# Patient Record
Sex: Female | Born: 1940 | Race: White | Hispanic: No | Marital: Married | Smoking: Former smoker
Health system: Southern US, Community
[De-identification: ages and names within clinical notes are randomized; demographics above are authoritative.]

## PROBLEM LIST (undated history)

## (undated) DIAGNOSIS — I10 Essential (primary) hypertension: Secondary | ICD-10-CM

## (undated) DIAGNOSIS — R51 Headache: Secondary | ICD-10-CM

## (undated) DIAGNOSIS — M1990 Unspecified osteoarthritis, unspecified site: Secondary | ICD-10-CM

## (undated) DIAGNOSIS — D649 Anemia, unspecified: Secondary | ICD-10-CM

## (undated) DIAGNOSIS — R519 Headache, unspecified: Secondary | ICD-10-CM

## (undated) HISTORY — PX: TUBAL LIGATION: SHX77

## (undated) HISTORY — PX: GALLBLADDER SURGERY: SHX652

---

## 2011-09-27 DIAGNOSIS — W19XXXA Unspecified fall, initial encounter: Secondary | ICD-10-CM | POA: Diagnosis not present

## 2011-09-27 DIAGNOSIS — E559 Vitamin D deficiency, unspecified: Secondary | ICD-10-CM | POA: Diagnosis not present

## 2011-09-27 DIAGNOSIS — I10 Essential (primary) hypertension: Secondary | ICD-10-CM | POA: Diagnosis not present

## 2011-09-27 DIAGNOSIS — M25519 Pain in unspecified shoulder: Secondary | ICD-10-CM | POA: Diagnosis not present

## 2012-04-21 DIAGNOSIS — N3000 Acute cystitis without hematuria: Secondary | ICD-10-CM | POA: Diagnosis not present

## 2012-06-07 ENCOUNTER — Emergency Department (HOSPITAL_COMMUNITY): Payer: Medicare Other

## 2012-06-07 ENCOUNTER — Encounter (HOSPITAL_COMMUNITY): Payer: Self-pay | Admitting: Emergency Medicine

## 2012-06-07 ENCOUNTER — Other Ambulatory Visit: Payer: Self-pay

## 2012-06-07 ENCOUNTER — Emergency Department (HOSPITAL_COMMUNITY)
Admission: EM | Admit: 2012-06-07 | Discharge: 2012-06-07 | Disposition: A | Discharge status: Home / Self Care | Payer: Medicare Other | Attending: Emergency Medicine | Admitting: Emergency Medicine

## 2012-06-07 DIAGNOSIS — I10 Essential (primary) hypertension: Secondary | ICD-10-CM | POA: Diagnosis not present

## 2012-06-07 DIAGNOSIS — R42 Dizziness and giddiness: Secondary | ICD-10-CM | POA: Diagnosis not present

## 2012-06-07 DIAGNOSIS — Z8739 Personal history of other diseases of the musculoskeletal system and connective tissue: Secondary | ICD-10-CM | POA: Insufficient documentation

## 2012-06-07 DIAGNOSIS — W19XXXA Unspecified fall, initial encounter: Secondary | ICD-10-CM | POA: Insufficient documentation

## 2012-06-07 DIAGNOSIS — Z9119 Patient's noncompliance with other medical treatment and regimen: Secondary | ICD-10-CM | POA: Diagnosis not present

## 2012-06-07 DIAGNOSIS — S43006A Unspecified dislocation of unspecified shoulder joint, initial encounter: Secondary | ICD-10-CM | POA: Diagnosis not present

## 2012-06-07 DIAGNOSIS — Y929 Unspecified place or not applicable: Secondary | ICD-10-CM | POA: Insufficient documentation

## 2012-06-07 DIAGNOSIS — M24419 Recurrent dislocation, unspecified shoulder: Secondary | ICD-10-CM | POA: Diagnosis not present

## 2012-06-07 DIAGNOSIS — Y939 Activity, unspecified: Secondary | ICD-10-CM | POA: Insufficient documentation

## 2012-06-07 DIAGNOSIS — Z91199 Patient's noncompliance with other medical treatment and regimen due to unspecified reason: Secondary | ICD-10-CM | POA: Insufficient documentation

## 2012-06-07 DIAGNOSIS — S43016A Anterior dislocation of unspecified humerus, initial encounter: Secondary | ICD-10-CM | POA: Diagnosis not present

## 2012-06-07 HISTORY — DX: Essential (primary) hypertension: I10

## 2012-06-07 HISTORY — DX: Unspecified osteoarthritis, unspecified site: M19.90

## 2012-06-07 LAB — CBC WITH DIFFERENTIAL/PLATELET
Eosinophils Relative: 0 % (ref 0–5)
Lymphocytes Relative: 17 % (ref 12–46)
Lymphs Abs: 1.3 10*3/uL (ref 0.7–4.0)
MCV: 88 fL (ref 78.0–100.0)
Neutro Abs: 5.8 10*3/uL (ref 1.7–7.7)
Neutrophils Relative %: 76 % (ref 43–77)
Platelets: 192 10*3/uL (ref 150–400)
RBC: 4.18 MIL/uL (ref 3.87–5.11)
WBC: 7.7 10*3/uL (ref 4.0–10.5)

## 2012-06-07 LAB — BASIC METABOLIC PANEL
CO2: 22 mEq/L (ref 19–32)
Chloride: 103 mEq/L (ref 96–112)
Glucose, Bld: 132 mg/dL — ABNORMAL HIGH (ref 70–99)
Potassium: 3.7 mEq/L (ref 3.5–5.1)
Sodium: 135 mEq/L (ref 135–145)

## 2012-06-07 MED ORDER — HYDROMORPHONE HCL PF 1 MG/ML IJ SOLN
1.0000 mg | Freq: Once | INTRAMUSCULAR | Status: AC
  Administered 2012-06-07: 1 mg via INTRAVENOUS
  Filled 2012-06-07: qty 1

## 2012-06-07 MED ORDER — ONDANSETRON HCL 4 MG/2ML IJ SOLN
4.0000 mg | Freq: Once | INTRAMUSCULAR | Status: AC
  Administered 2012-06-07: 4 mg via INTRAVENOUS
  Filled 2012-06-07: qty 2

## 2012-06-07 MED ORDER — OXYCODONE-ACETAMINOPHEN 5-325 MG PO TABS: 2.0000 | ORAL_TABLET | ORAL | Status: DC | PRN

## 2012-06-07 MED ORDER — SODIUM CHLORIDE 0.9 % IV SOLN: Freq: Once | INTRAVENOUS | Status: DC

## 2012-06-07 NOTE — ED Notes (Signed)
Left shoulder sling placed and second xray completed,

## 2012-06-07 NOTE — ED Provider Notes (Signed)
Medical screening examination/treatment/procedure(s) were conducted as a shared visit with non-physician practitioner(s) and myself.  I personally evaluated the patient during the encounter.    L shoulder deformity and TTP with distal N/V intact. Mild abrasion L hand no deformity. A/O x 4. Lungs CTA. Heart RRR.   See attached note with shoulder reduction  Results for orders placed during the hospital encounter of 06/07/12  CBC WITH DIFFERENTIAL      Component Value Range   WBC 7.7  4.0 - 10.5 K/uL   RBC 4.18  3.87 - 5.11 MIL/uL   Hemoglobin 12.3  12.0 - 15.0 g/dL   HCT 16.1  09.6 - 04.5 %   MCV 88.0  78.0 - 100.0 fL   MCH 29.4  26.0 - 34.0 pg   MCHC 33.4  30.0 - 36.0 g/dL   RDW 40.9  81.1 - 91.4 %   Platelets 192  150 - 400 K/uL   Neutrophils Relative 76  43 - 77 %   Neutro Abs 5.8  1.7 - 7.7 K/uL   Lymphocytes Relative 17  12 - 46 %   Lymphs Abs 1.3  0.7 - 4.0 K/uL   Monocytes Relative 8  3 - 12 %   Monocytes Absolute 0.6  0.1 - 1.0 K/uL   Eosinophils Relative 0  0 - 5 %   Eosinophils Absolute 0.0  0.0 - 0.7 K/uL   Basophils Relative 0  0 - 1 %   Basophils Absolute 0.0  0.0 - 0.1 K/uL  BASIC METABOLIC PANEL      Component Value Range   Sodium 135  135 - 145 mEq/L   Potassium 3.7  3.5 - 5.1 mEq/L   Chloride 103  96 - 112 mEq/L   CO2 22  19 - 32 mEq/L   Glucose, Bld 132 (*) 70 - 99 mg/dL   BUN 13  6 - 23 mg/dL   Creatinine, Ser 7.82  0.50 - 1.10 mg/dL   Calcium 8.7  8.4 - 95.6 mg/dL   GFR calc non Af Amer 86 (*) >90 mL/min   GFR calc Af Amer >90  >90 mL/min   Dg Shoulder Left  06/07/2012  *RADIOLOGY REPORT*  Clinical Data: Postreduction.  LEFT SHOULDER - 2+ VIEW  Comparison: 06/07/2012 0616 hours  Findings: Interval reduction of previous left anterior shoulder dislocation.  Inferior glenoid is not well demonstrated on this view.  No discrete fractures otherwise identified.  IMPRESSION: Interval reduction of previous anterior dislocation of the left shoulder.   Original  Report Authenticated By: Marlon Pel, M.D.    Dg Shoulder Left  06/07/2012  *RADIOLOGY REPORT*  Clinical Data: Anterior shoulder pain after fall.  LEFT SHOULDER - 2+ VIEW  Comparison: None.  Findings: There is anterior dislocation of the humeral head with respect to the glenoid.  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  No focal bone lesion. Acromioclavicular and coracoclavicular spaces are intact. Prominent transverse processes of C7.  IMPRESSION: Anterior dislocation of the left shoulder with suspicion of Bankart lesion on the inferior glenoid.   Original Report Authenticated By: Marlon Pel, M.D.       Sunnie Nielsen, MD 06/07/12 [phone]

## 2012-06-07 NOTE — ED Provider Notes (Signed)
History     CSN: [id number]  Arrival date & time 06/07/12  4098   First MD Initiated Contact with Patient 06/07/12 [phone]      Chief Complaint  Patient presents with  . Shoulder Injury  . Dizziness  . Fall    (Consider location/radiation/quality/duration/timing/severity/associated sxs/prior treatment) HPI  Past Medical History  Diagnosis Date  . Arthritis   . Hypertension     Past Surgical History  Procedure Date  . Tubal ligation   . Gallbladder surgery     No family history on file.  History  Substance Use Topics  . Smoking status: Former Smoker    Quit date: 06/06/2009  . Smokeless tobacco: Not on file  . Alcohol Use: No    OB History    Grav Para Term Preterm Abortions TAB SAB Ect Mult Living                  Review of Systems  Allergies  Review of patient's allergies indicates not on file.  Home Medications  No current outpatient prescriptions on file.  BP 166/90  Pulse 73  Temp 97.5 F (36.4 C) (Oral)  Resp 20  Ht 5\' 2"  (1.575 m)  Wt 185 lb (83.915 kg)  BMI 33.84 kg/m2  SpO2 100%  Physical Exam  ED Course  Reduction of dislocation Date/Time: 06/07/2012 6:30 AM Performed by: Sunnie Nielsen Authorized by: Sunnie Nielsen Consent: Verbal consent obtained. Risks and benefits: risks, benefits and alternatives were discussed Consent given by: patient Patient understanding: patient states understanding of the procedure being performed Patient consent: the patient's understanding of the procedure matches consent given Procedure consent: procedure consent matches procedure scheduled Required items: required blood products, implants, devices, and special equipment available Patient identity confirmed: verbally with patient Time out: Immediately prior to procedure a "time out" was called to verify the correct patient, procedure, equipment, support staff and site/side marked as required. Preparation: Patient was prepped and draped in the usual  sterile fashion. Patient tolerance: Patient tolerated the procedure well with no immediate complications. Comments: After IV Dilaudid, LUE manipulated with traction and L shoulder reduced.    (including critical care time)   Labs Reviewed  URINALYSIS, ROUTINE W REFLEX MICROSCOPIC  CBC WITH DIFFERENTIAL  BASIC METABOLIC PANEL   No results found.   No diagnosis found.    MDM           Sunnie Nielsen, MD 06/07/12 [phone]

## 2012-06-07 NOTE — ED Notes (Signed)
Pt presents to the ED with a complaint of shoulder pain.  Pt states" I went out to the car last night at 9 pm and heard voices, turned suddenly, got dizzy and lost my balance.  Pt is unsure of how she landed but states that the left shoulder exhibited pain.  Shoulder appears to have some deformity to it.  Pt is accompanied by her husband and are visiting from out of state.  Pt appears in no distress at this time. Vital signe are within normal limits.

## 2012-06-07 NOTE — ED Provider Notes (Signed)
History     CSN: [id number]  Arrival date & time 06/07/12  0549   None     Chief Complaint  Patient presents with  . Shoulder Injury  . Dizziness  . Fall    (Consider location/radiation/quality/duration/timing/severity/associated sxs/prior treatment) Patient is a 71 y.o. female presenting with shoulder injury and fall. The history is provided by the patient. No language interpreter was used.  Shoulder Injury This is a new problem. The current episode started yesterday. The problem occurs constantly. The problem has been unchanged. Associated symptoms include vertigo. Pertinent negatives include no nausea or vomiting.  Fall Pertinent negatives include no nausea and no vomiting.   71 year old female coming in to the ER with complaint of left shoulder pain. States that she fell last night around 9 PM after she turned her head quickly and got dizzy. States that she fell on hard concrete and landed on her front side and possibly her left shoulder. The shoulder looks slightly anterior. Good CMS below injury. Patient is from out of town and with a friend visiting her father in a nursing home. Patient has taken Tylenol for pain. Patient denies chest she has a family history of cardiac disease. Patient is on blood pressure medication. She does have a history of arthritis as well as tubal ligation and gallbladder surgery. She is a former smoker.  Past Medical History  Diagnosis Date  . Arthritis   . Hypertension     Past Surgical History  Procedure Date  . Tubal ligation   . Gallbladder surgery     No family history on file.  History  Substance Use Topics  . Smoking status: Former Smoker    Quit date: 06/06/2009  . Smokeless tobacco: Not on file  . Alcohol Use: No    OB History    Grav Para Term Preterm Abortions TAB SAB Ect Mult Living                  Review of Systems  Constitutional: Negative.   HENT: Negative.   Eyes: Negative.   Respiratory: Negative.     Cardiovascular: Negative.   Gastrointestinal: Negative.  Negative for nausea and vomiting.  Musculoskeletal:       L shoulder pain  Neurological: Positive for vertigo.  Psychiatric/Behavioral: Negative.   All other systems reviewed and are negative.    Allergies  Review of patient's allergies indicates not on file.  Home Medications  No current outpatient prescriptions on file.  BP 166/90  Pulse 73  Temp 97.5 F (36.4 C) (Oral)  Resp 20  Ht 5\' 2"  (1.575 m)  Wt 185 lb (83.915 kg)  BMI 33.84 kg/m2  SpO2 100%  Physical Exam  Nursing note and vitals reviewed. Constitutional: She is oriented to person, place, and time. She appears well-developed and well-nourished.  HENT:  Head: Normocephalic and atraumatic.  Eyes: Conjunctivae normal and EOM are normal. Pupils are equal, round, and reactive to light.  Neck: Normal range of motion. Neck supple.  Cardiovascular: Normal rate.   Pulmonary/Chest: Effort normal.  Abdominal: Soft.  Musculoskeletal: Normal range of motion. She exhibits tenderness. She exhibits no edema.       L shoulder deformity +CMS below injury  Neurological: She is alert and oriented to person, place, and time. She has normal reflexes.  Skin: Skin is warm and dry.  Psychiatric: She has a normal mood and affect.    ED Course  Procedures (including critical care time)   Labs Reviewed  URINALYSIS, ROUTINE  W REFLEX MICROSCOPIC  CBC WITH DIFFERENTIAL  BASIC METABOLIC PANEL   No results found.   No diagnosis found.    MDM   Date: 06/07/2012  Rate: 69  Rhythm: normal sinus rhythm  QRS Axis: normal  Intervals: normal  ST/T Wave abnormalities: normal  Conduction Disutrbances:none  Narrative Interpretation:   Old EKG Reviewed: none available  20 -year-old female with anterior R shoulder dislocation after a fall last pm.  Right shoulder was reduced in the ER under IV sedation with dilaudid by Dr. Dierdre Highman. Patient will followup within a week when she  gets back [place] or she will use the orthopedic listed in the discharge if she stays up on. Swelling and pain medication was provided. Pain medication rx provided. All labs unremarkable.   Labs Reviewed  CBC WITH DIFFERENTIAL  URINALYSIS, ROUTINE W REFLEX MICROSCOPIC  BASIC METABOLIC PANEL          Remi Haggard, NP 06/07/12 [phone]

## 2012-06-18 DIAGNOSIS — S43016A Anterior dislocation of unspecified humerus, initial encounter: Secondary | ICD-10-CM | POA: Diagnosis not present

## 2012-06-18 DIAGNOSIS — M25519 Pain in unspecified shoulder: Secondary | ICD-10-CM | POA: Diagnosis not present

## 2012-06-23 DIAGNOSIS — M25519 Pain in unspecified shoulder: Secondary | ICD-10-CM | POA: Diagnosis not present

## 2012-06-23 DIAGNOSIS — S46819A Strain of other muscles, fascia and tendons at shoulder and upper arm level, unspecified arm, initial encounter: Secondary | ICD-10-CM | POA: Diagnosis not present

## 2012-06-23 DIAGNOSIS — S42143A Displaced fracture of glenoid cavity of scapula, unspecified shoulder, initial encounter for closed fracture: Secondary | ICD-10-CM | POA: Diagnosis not present

## 2012-06-23 DIAGNOSIS — M25419 Effusion, unspecified shoulder: Secondary | ICD-10-CM | POA: Diagnosis not present

## 2012-06-23 DIAGNOSIS — S43499A Other sprain of unspecified shoulder joint, initial encounter: Secondary | ICD-10-CM | POA: Diagnosis not present

## 2012-06-26 DIAGNOSIS — S42143A Displaced fracture of glenoid cavity of scapula, unspecified shoulder, initial encounter for closed fracture: Secondary | ICD-10-CM | POA: Diagnosis not present

## 2012-06-26 DIAGNOSIS — S42153A Displaced fracture of neck of scapula, unspecified shoulder, initial encounter for closed fracture: Secondary | ICD-10-CM | POA: Diagnosis not present

## 2012-07-02 DIAGNOSIS — S43499A Other sprain of unspecified shoulder joint, initial encounter: Secondary | ICD-10-CM | POA: Diagnosis not present

## 2012-07-02 DIAGNOSIS — M24819 Other specific joint derangements of unspecified shoulder, not elsewhere classified: Secondary | ICD-10-CM | POA: Diagnosis not present

## 2012-07-02 DIAGNOSIS — S46819A Strain of other muscles, fascia and tendons at shoulder and upper arm level, unspecified arm, initial encounter: Secondary | ICD-10-CM | POA: Diagnosis not present

## 2012-07-02 DIAGNOSIS — R928 Other abnormal and inconclusive findings on diagnostic imaging of breast: Secondary | ICD-10-CM | POA: Diagnosis not present

## 2012-07-14 DIAGNOSIS — Z01812 Encounter for preprocedural laboratory examination: Secondary | ICD-10-CM | POA: Diagnosis not present

## 2012-07-17 DIAGNOSIS — I10 Essential (primary) hypertension: Secondary | ICD-10-CM | POA: Diagnosis not present

## 2012-07-17 DIAGNOSIS — Z01818 Encounter for other preprocedural examination: Secondary | ICD-10-CM | POA: Diagnosis not present

## 2012-07-17 DIAGNOSIS — M25519 Pain in unspecified shoulder: Secondary | ICD-10-CM | POA: Diagnosis not present

## 2012-07-23 DIAGNOSIS — S43499A Other sprain of unspecified shoulder joint, initial encounter: Secondary | ICD-10-CM | POA: Diagnosis not present

## 2012-07-23 DIAGNOSIS — G8918 Other acute postprocedural pain: Secondary | ICD-10-CM | POA: Diagnosis not present

## 2012-07-23 DIAGNOSIS — S46819A Strain of other muscles, fascia and tendons at shoulder and upper arm level, unspecified arm, initial encounter: Secondary | ICD-10-CM | POA: Diagnosis not present

## 2012-07-23 DIAGNOSIS — S43439A Superior glenoid labrum lesion of unspecified shoulder, initial encounter: Secondary | ICD-10-CM | POA: Diagnosis not present

## 2012-07-23 DIAGNOSIS — M259 Joint disorder, unspecified: Secondary | ICD-10-CM | POA: Diagnosis not present

## 2012-07-31 DIAGNOSIS — Z4789 Encounter for other orthopedic aftercare: Secondary | ICD-10-CM | POA: Diagnosis not present

## 2012-09-03 DIAGNOSIS — M25819 Other specified joint disorders, unspecified shoulder: Secondary | ICD-10-CM | POA: Diagnosis not present

## 2012-09-03 DIAGNOSIS — M25519 Pain in unspecified shoulder: Secondary | ICD-10-CM | POA: Diagnosis not present

## 2012-09-09 DIAGNOSIS — M25519 Pain in unspecified shoulder: Secondary | ICD-10-CM | POA: Diagnosis not present

## 2012-09-09 DIAGNOSIS — M25819 Other specified joint disorders, unspecified shoulder: Secondary | ICD-10-CM | POA: Diagnosis not present

## 2012-09-10 DIAGNOSIS — M25819 Other specified joint disorders, unspecified shoulder: Secondary | ICD-10-CM | POA: Diagnosis not present

## 2012-09-10 DIAGNOSIS — M25519 Pain in unspecified shoulder: Secondary | ICD-10-CM | POA: Diagnosis not present

## 2012-09-12 DIAGNOSIS — M25519 Pain in unspecified shoulder: Secondary | ICD-10-CM | POA: Diagnosis not present

## 2012-09-12 DIAGNOSIS — M25819 Other specified joint disorders, unspecified shoulder: Secondary | ICD-10-CM | POA: Diagnosis not present

## 2012-09-15 DIAGNOSIS — M25819 Other specified joint disorders, unspecified shoulder: Secondary | ICD-10-CM | POA: Diagnosis not present

## 2012-09-15 DIAGNOSIS — M25519 Pain in unspecified shoulder: Secondary | ICD-10-CM | POA: Diagnosis not present

## 2012-09-19 DIAGNOSIS — M25819 Other specified joint disorders, unspecified shoulder: Secondary | ICD-10-CM | POA: Diagnosis not present

## 2012-09-19 DIAGNOSIS — M25519 Pain in unspecified shoulder: Secondary | ICD-10-CM | POA: Diagnosis not present

## 2012-09-22 DIAGNOSIS — M25819 Other specified joint disorders, unspecified shoulder: Secondary | ICD-10-CM | POA: Diagnosis not present

## 2012-09-22 DIAGNOSIS — M25519 Pain in unspecified shoulder: Secondary | ICD-10-CM | POA: Diagnosis not present

## 2012-09-24 DIAGNOSIS — M25819 Other specified joint disorders, unspecified shoulder: Secondary | ICD-10-CM | POA: Diagnosis not present

## 2012-09-24 DIAGNOSIS — M25519 Pain in unspecified shoulder: Secondary | ICD-10-CM | POA: Diagnosis not present

## 2012-09-25 DIAGNOSIS — M75100 Unspecified rotator cuff tear or rupture of unspecified shoulder, not specified as traumatic: Secondary | ICD-10-CM | POA: Diagnosis not present

## 2012-09-25 DIAGNOSIS — Z4789 Encounter for other orthopedic aftercare: Secondary | ICD-10-CM | POA: Diagnosis not present

## 2012-09-26 DIAGNOSIS — M25819 Other specified joint disorders, unspecified shoulder: Secondary | ICD-10-CM | POA: Diagnosis not present

## 2012-09-26 DIAGNOSIS — M25519 Pain in unspecified shoulder: Secondary | ICD-10-CM | POA: Diagnosis not present

## 2012-09-29 DIAGNOSIS — M25819 Other specified joint disorders, unspecified shoulder: Secondary | ICD-10-CM | POA: Diagnosis not present

## 2012-09-29 DIAGNOSIS — M25519 Pain in unspecified shoulder: Secondary | ICD-10-CM | POA: Diagnosis not present

## 2012-10-10 DIAGNOSIS — M25519 Pain in unspecified shoulder: Secondary | ICD-10-CM | POA: Diagnosis not present

## 2012-10-10 DIAGNOSIS — M25819 Other specified joint disorders, unspecified shoulder: Secondary | ICD-10-CM | POA: Diagnosis not present

## 2012-10-13 DIAGNOSIS — M25819 Other specified joint disorders, unspecified shoulder: Secondary | ICD-10-CM | POA: Diagnosis not present

## 2012-10-13 DIAGNOSIS — M25519 Pain in unspecified shoulder: Secondary | ICD-10-CM | POA: Diagnosis not present

## 2012-10-16 DIAGNOSIS — N3000 Acute cystitis without hematuria: Secondary | ICD-10-CM | POA: Diagnosis not present

## 2012-10-17 DIAGNOSIS — M25819 Other specified joint disorders, unspecified shoulder: Secondary | ICD-10-CM | POA: Diagnosis not present

## 2012-10-17 DIAGNOSIS — M25519 Pain in unspecified shoulder: Secondary | ICD-10-CM | POA: Diagnosis not present

## 2012-10-20 DIAGNOSIS — M25519 Pain in unspecified shoulder: Secondary | ICD-10-CM | POA: Diagnosis not present

## 2012-10-20 DIAGNOSIS — Z4789 Encounter for other orthopedic aftercare: Secondary | ICD-10-CM | POA: Diagnosis not present

## 2012-12-15 DIAGNOSIS — M2010 Hallux valgus (acquired), unspecified foot: Secondary | ICD-10-CM | POA: Diagnosis not present

## 2013-06-01 DIAGNOSIS — Z23 Encounter for immunization: Secondary | ICD-10-CM | POA: Diagnosis not present

## 2013-09-29 DIAGNOSIS — M899 Disorder of bone, unspecified: Secondary | ICD-10-CM | POA: Diagnosis not present

## 2013-09-29 DIAGNOSIS — M2550 Pain in unspecified joint: Secondary | ICD-10-CM | POA: Diagnosis not present

## 2013-09-29 DIAGNOSIS — M949 Disorder of cartilage, unspecified: Secondary | ICD-10-CM | POA: Diagnosis not present

## 2013-09-29 DIAGNOSIS — D126 Benign neoplasm of colon, unspecified: Secondary | ICD-10-CM | POA: Diagnosis not present

## 2013-09-29 DIAGNOSIS — I10 Essential (primary) hypertension: Secondary | ICD-10-CM | POA: Diagnosis not present

## 2013-09-29 DIAGNOSIS — Z800 Family history of malignant neoplasm of digestive organs: Secondary | ICD-10-CM | POA: Diagnosis not present

## 2013-09-30 ENCOUNTER — Other Ambulatory Visit: Payer: Self-pay | Admitting: Family Medicine

## 2013-09-30 ENCOUNTER — Other Ambulatory Visit: Payer: Self-pay

## 2013-09-30 DIAGNOSIS — M8580 Other specified disorders of bone density and structure, unspecified site: Secondary | ICD-10-CM

## 2013-09-30 DIAGNOSIS — Z1231 Encounter for screening mammogram for malignant neoplasm of breast: Secondary | ICD-10-CM

## 2013-10-20 ENCOUNTER — Ambulatory Visit
Admission: RE | Admit: 2013-10-20 | Discharge: 2013-10-20 | Disposition: A | Discharge status: Home / Self Care | Payer: Medicare Other | Source: Ambulatory Visit

## 2013-10-20 ENCOUNTER — Ambulatory Visit
Admission: RE | Admit: 2013-10-20 | Discharge: 2013-10-20 | Disposition: A | Discharge status: Home / Self Care | Payer: Medicare Other | Source: Ambulatory Visit | Attending: Family Medicine | Admitting: Family Medicine

## 2013-10-20 DIAGNOSIS — Z1231 Encounter for screening mammogram for malignant neoplasm of breast: Secondary | ICD-10-CM

## 2013-10-20 DIAGNOSIS — M8580 Other specified disorders of bone density and structure, unspecified site: Secondary | ICD-10-CM

## 2013-10-20 DIAGNOSIS — M810 Age-related osteoporosis without current pathological fracture: Secondary | ICD-10-CM | POA: Diagnosis not present

## 2013-10-26 DIAGNOSIS — Z13220 Encounter for screening for lipoid disorders: Secondary | ICD-10-CM | POA: Diagnosis not present

## 2013-10-26 DIAGNOSIS — Z0000 Encounter for general adult medical examination without abnormal findings: Secondary | ICD-10-CM | POA: Diagnosis not present

## 2013-10-26 DIAGNOSIS — Z23 Encounter for immunization: Secondary | ICD-10-CM | POA: Diagnosis not present

## 2013-10-26 DIAGNOSIS — M899 Disorder of bone, unspecified: Secondary | ICD-10-CM | POA: Diagnosis not present

## 2013-10-26 DIAGNOSIS — N814 Uterovaginal prolapse, unspecified: Secondary | ICD-10-CM | POA: Diagnosis not present

## 2013-10-26 DIAGNOSIS — I10 Essential (primary) hypertension: Secondary | ICD-10-CM | POA: Diagnosis not present

## 2013-10-26 DIAGNOSIS — Z1331 Encounter for screening for depression: Secondary | ICD-10-CM | POA: Diagnosis not present

## 2013-10-26 DIAGNOSIS — M949 Disorder of cartilage, unspecified: Secondary | ICD-10-CM | POA: Diagnosis not present

## 2013-10-26 DIAGNOSIS — Z131 Encounter for screening for diabetes mellitus: Secondary | ICD-10-CM | POA: Diagnosis not present

## 2013-10-26 DIAGNOSIS — M129 Arthropathy, unspecified: Secondary | ICD-10-CM | POA: Diagnosis not present

## 2013-10-27 DIAGNOSIS — M899 Disorder of bone, unspecified: Secondary | ICD-10-CM | POA: Diagnosis not present

## 2013-10-27 DIAGNOSIS — Z136 Encounter for screening for cardiovascular disorders: Secondary | ICD-10-CM | POA: Diagnosis not present

## 2013-10-27 DIAGNOSIS — Z131 Encounter for screening for diabetes mellitus: Secondary | ICD-10-CM | POA: Diagnosis not present

## 2013-10-27 DIAGNOSIS — M949 Disorder of cartilage, unspecified: Secondary | ICD-10-CM | POA: Diagnosis not present

## 2013-10-27 DIAGNOSIS — I10 Essential (primary) hypertension: Secondary | ICD-10-CM | POA: Diagnosis not present

## 2013-11-02 DIAGNOSIS — M65979 Unspecified synovitis and tenosynovitis, unspecified ankle and foot: Secondary | ICD-10-CM | POA: Diagnosis not present

## 2013-11-02 DIAGNOSIS — M79609 Pain in unspecified limb: Secondary | ICD-10-CM | POA: Diagnosis not present

## 2013-11-02 DIAGNOSIS — M2010 Hallux valgus (acquired), unspecified foot: Secondary | ICD-10-CM | POA: Diagnosis not present

## 2013-11-02 DIAGNOSIS — M659 Synovitis and tenosynovitis, unspecified: Secondary | ICD-10-CM | POA: Diagnosis not present

## 2013-12-09 ENCOUNTER — Other Ambulatory Visit: Payer: Self-pay | Admitting: Gastroenterology

## 2014-01-14 ENCOUNTER — Encounter (HOSPITAL_COMMUNITY): Payer: Self-pay | Admitting: Emergency Medicine

## 2014-01-14 ENCOUNTER — Emergency Department (HOSPITAL_COMMUNITY): Payer: Medicare Other

## 2014-01-14 ENCOUNTER — Emergency Department (HOSPITAL_COMMUNITY)
Admission: EM | Admit: 2014-01-14 | Discharge: 2014-01-14 | Disposition: A | Discharge status: Home / Self Care | Payer: Medicare Other | Attending: Emergency Medicine | Admitting: Emergency Medicine

## 2014-01-14 DIAGNOSIS — R296 Repeated falls: Secondary | ICD-10-CM | POA: Insufficient documentation

## 2014-01-14 DIAGNOSIS — Z87891 Personal history of nicotine dependence: Secondary | ICD-10-CM | POA: Insufficient documentation

## 2014-01-14 DIAGNOSIS — S8253XA Displaced fracture of medial malleolus of unspecified tibia, initial encounter for closed fracture: Secondary | ICD-10-CM | POA: Diagnosis not present

## 2014-01-14 DIAGNOSIS — Z880 Allergy status to penicillin: Secondary | ICD-10-CM | POA: Insufficient documentation

## 2014-01-14 DIAGNOSIS — I10 Essential (primary) hypertension: Secondary | ICD-10-CM | POA: Diagnosis not present

## 2014-01-14 DIAGNOSIS — X500XXA Overexertion from strenuous movement or load, initial encounter: Secondary | ICD-10-CM | POA: Insufficient documentation

## 2014-01-14 DIAGNOSIS — S82839A Other fracture of upper and lower end of unspecified fibula, initial encounter for closed fracture: Secondary | ICD-10-CM | POA: Diagnosis not present

## 2014-01-14 DIAGNOSIS — S9000XA Contusion of unspecified ankle, initial encounter: Secondary | ICD-10-CM | POA: Insufficient documentation

## 2014-01-14 DIAGNOSIS — Z79899 Other long term (current) drug therapy: Secondary | ICD-10-CM | POA: Diagnosis not present

## 2014-01-14 DIAGNOSIS — M129 Arthropathy, unspecified: Secondary | ICD-10-CM | POA: Insufficient documentation

## 2014-01-14 DIAGNOSIS — S82839B Other fracture of upper and lower end of unspecified fibula, initial encounter for open fracture type I or II: Secondary | ICD-10-CM | POA: Insufficient documentation

## 2014-01-14 DIAGNOSIS — Y9301 Activity, walking, marching and hiking: Secondary | ICD-10-CM | POA: Insufficient documentation

## 2014-01-14 DIAGNOSIS — S82409A Unspecified fracture of shaft of unspecified fibula, initial encounter for closed fracture: Secondary | ICD-10-CM

## 2014-01-14 DIAGNOSIS — Y9289 Other specified places as the place of occurrence of the external cause: Secondary | ICD-10-CM | POA: Insufficient documentation

## 2014-01-14 MED ORDER — ONDANSETRON HCL 4 MG PO TABS: 4.0000 mg | ORAL_TABLET | Freq: Four times a day (QID) | ORAL | Status: DC

## 2014-01-14 MED ORDER — OXYCODONE-ACETAMINOPHEN 5-325 MG PO TABS
2.0000 | ORAL_TABLET | Freq: Once | ORAL | Status: AC
  Administered 2014-01-14: 2 via ORAL
  Filled 2014-01-14: qty 2

## 2014-01-14 MED ORDER — ONDANSETRON 8 MG PO TBDP
8.0000 mg | ORAL_TABLET | Freq: Once | ORAL | Status: AC
  Administered 2014-01-14: 8 mg via ORAL
  Filled 2014-01-14: qty 1

## 2014-01-14 MED ORDER — OXYCODONE-ACETAMINOPHEN 5-325 MG PO TABS: 1.0000 | ORAL_TABLET | Freq: Four times a day (QID) | ORAL | Status: DC | PRN

## 2014-01-14 NOTE — Discharge Instructions (Signed)
Displaced Fibular Fracture (Adult, Ankle) Treated With ORIF Your displaced fracture is at the part of the fibula that is located at your ankle. Displaced means that the bone is not lined up correctly. Because of this, the bones must be put back into position with a procedure called open reduction and internal fixation (ORIF). ORIF ankle surgery will provide the best chance for your ankle to heal right and decrease the chances of later arthritis and disability.  LET [facility] CARE PROVIDER KNOW ABOUT:  Any allergies you have.  All medicines you are taking, including vitamins, herbs, eye drops, creams, and over-the-counter medicines.  Previous problems you or members of your family have had with the use of anesthetics.  Any blood disorders you have.  Previous surgeries you have had.  Medical conditions you have. RISKS AND COMPLICATIONS Generally, this is a safe procedure. However, as with any procedure, complications can occur. Possible complications include:  Excessive bleeding.  Infection.  Posttraumatic arthritis.  Failure to heal properly resulting in an unstable ankle.  Stiffness of the ankle after the repair. BEFORE THE PROCEDURE  Do not eat or drink after midnight the day before your procedure, or as instructed by your health care provider.   Ask your health care provider about changing or stopping any regular medicines. You may need to stop taking certain medicines up to 1 week before the procedure.   You may have lab tests the morning of the procedure.   Make arrangements for someone to drive you home after your procedure. PROCEDURE   An IV tube will be inserted into one of your veins.  You will receive fluids and medicine to make you relax through this tube.  You will then be given medicines to make you sleep (general anesthetic).  A cut (incision) will be made on the outside of your ankle to expose the bone and clean it out.  The bone is then aligned back  together, and screws and plates are used to hold this in place.  The skin and tissue are sewn back together to cover the repaired bone.  Dressings are placed over your incision. AFTER THE PROCEDURE  You will be taken to a recovery area and monitored until the anesthetic wears off.  You will be given pain medicine to control the pain.  You will be discharged after your pain is controlled and you can drink liquids. Document Released: 07/30/2005 Document Revised: 05/20/2013 Document Reviewed: 02/26/2013 [facility] Patient Information 2014 [place], [place].  Ankle Fracture A fracture is a break in a bone. A cast or splint may be used to protect the ankle and heal the break. Sometimes, surgery is needed. HOME CARE  Use crutches as told by your doctor. It is very important that you use your crutches correctly.  Do not put weight or pressure on the injured ankle until told by your doctor.  Keep your ankle raised (elevated) when sitting or lying down.  Apply ice to the ankle:  Put ice in a plastic bag.  Place a towel between your cast and the bag.  Leave the ice on for 20 minutes, 2 3 times a day.  If you have a plaster or fiberglass cast:  Do not try to scratch under the cast with any objects.  Check the skin around the cast every day. You may put lotion on red or sore areas.  Keep your cast dry and clean.  If you have a plaster splint:  Wear the splint as told by your  doctor.  You can loosen the elastic around the splint if your toes get numb, tingle, or turn cold or blue.  Do not put pressure on any part of your cast or splint. It may break. Rest your plaster splint or cast only on a pillow the first 24 hours until it is fully hardened.  Cover your cast or splint with a plastic bag during showers.  Do not lower your cast or splint into water.  Take medicine as told by your doctor.  Do not drive until your doctor says it is safe.  Follow-up with your doctor as told. It  is very important that you go to your follow-up visits. GET HELP IF: The swelling and discomfort gets worse.  GET HELP RIGHT AWAY IF:   Your splint or cast breaks.  You continue to have very bad pain.  You have new pain or swelling after your splint or cast was put on.  Your skin or toes below the injured ankle:  Turn blue or gray.  Feel cold, numb, or you cannot feel them.  There is a bad smell or yellowish white fluid (pus) coming from under the splint or cast. MAKE SURE YOU:   Understand these instructions.  Will watch your condition.  Will get help right away if you are not doing well or get worse. Document Released: 05/27/2009 Document Revised: 05/20/2013 Document Reviewed: 02/26/2013 [facility] Patient Information 2014 [place], [place].

## 2014-01-14 NOTE — ED Provider Notes (Signed)
CSN: [id number]     Arrival date & time 01/14/14  1752 History  This chart was scribed for non-physician practitioner, Elwyn Lade, PA-C, working with Mirna Mires, MD by Ladene Artist, ED Scribe. This patient was seen in room [place] and the patient's care was started at 6:53 PM.  Chief Complaint  Patient presents with  . Ankle Pain   The history is provided by the patient. No language interpreter was used.   HPI Comments: Wendy Fleming is a 73 y.o. female who presents to the Emergency Department complaining of sharp, R ankle pain onset today. Pt states that she was walking at [employer] when she misjudged the length of the sidewalk and fell. Pt states that she twisted her R ankle upon falling and has not been able to bear weight since the incident. Pt reports the most pain to the back of the ankle. She reports associated swelling. Pt also reports mild L ankle pain and suspects bruising to this ankle. No LOC. She lives at home with her husband. There are 4 steps to get into her house.   Past Medical History  Diagnosis Date  . Arthritis   . Hypertension    Past Surgical History  Procedure Laterality Date  . Tubal ligation    . Gallbladder surgery     No family history on file. History  Substance Use Topics  . Smoking status: Former Smoker    Quit date: 06/06/2009  . Smokeless tobacco: Not on file  . Alcohol Use: No   OB History   Grav Para Term Preterm Abortions TAB SAB Ect Mult Living                 Review of Systems  Musculoskeletal: Positive for arthralgias and joint swelling.  Neurological: Negative for syncope.  All other systems reviewed and are negative.   Allergies  Erythromycin; Penicillins; Streptomycin; Sulfa antibiotics; and Tetracyclines & related  Home Medications   Prior to Admission medications   Medication Sig Start Date End Date Taking? Authorizing Provider  acetaminophen (TYLENOL) 325 MG tablet Take 650 mg by mouth every 6 (six) hours as needed  (pain).   Yes Historical Provider, MD  cholecalciferol (VITAMIN D) 1000 UNITS tablet Take 1,000 Units by mouth daily.   Yes Historical Provider, MD  Multiple Vitamin (MULTIVITAMIN WITH MINERALS) TABS Take 1 tablet by mouth daily.   Yes Historical Provider, MD  trandolapril (MAVIK) 4 MG tablet Take 4 mg by mouth daily.   Yes Historical Provider, MD   Triage Vitals: BP 155/74  Pulse 60  Temp(Src) 98.1 F (36.7 C) (Oral)  Resp 18  SpO2 100% Physical Exam  Nursing note and vitals reviewed. Constitutional: She is oriented to person, place, and time. She appears well-developed and well-nourished. No distress.  HENT:  Head: Normocephalic and atraumatic.  Right Ear: External ear normal.  Left Ear: External ear normal.  Nose: Nose normal.  Mouth/Throat: Oropharynx is clear and moist.  Eyes: Conjunctivae are normal.  Neck: Normal range of motion.  Cardiovascular: Normal rate, regular rhythm and normal heart sounds.   Pulmonary/Chest: Effort normal and breath sounds normal. No stridor. No respiratory distress. She has no wheezes. She has no rales.  Abdominal: Soft. She exhibits no distension.  Musculoskeletal: Normal range of motion.       Right ankle: She exhibits swelling. Tenderness. Lateral malleolus and medial malleolus tenderness found.  Significant swelling and bruising to R ankle Mild tenderness to palpation over proximal fibula Tenderness to palpation  over medial and lateral malleolus  Neurovascularly intact Compartment soft  Neurological: She is alert and oriented to person, place, and time. She has normal strength.  Skin: Skin is warm and dry. She is not diaphoretic. No erythema.  Psychiatric: She has a normal mood and affect. Her behavior is normal.   ED Course  Procedures (including critical care time) DIAGNOSTIC STUDIES: Oxygen Saturation is 100% on RA, normal by my interpretation.    COORDINATION OF CARE: 6:56 PM Discussed treatment plan with pt at bedside and pt agreed  to plan.  Labs Review Labs Reviewed - No data to display  Imaging Review Dg Tibia/fibula Right  01/14/2014   CLINICAL DATA:  73 year old female with fall and right lower leg pain.  EXAM: RIGHT TIBIA AND FIBULA - 2 VIEW  COMPARISON:  None  FINDINGS: An oblique fracture of the proximal fibular diaphysis is identified with 4 mm anterior displacement.  Degenerative changes within the right knee noted.  IMPRESSION: Oblique proximal fibular diaphyseal fracture with 4 mm anterior displacement.   Electronically Signed   By: Hassan Rowan M.D.   On: 01/14/2014 18:53   Dg Ankle Complete Right  01/14/2014   CLINICAL DATA:  Post fall, now with right ankle pain and swelling neck is the  EXAM: RIGHT ANKLE - COMPLETE 3+ VIEW  COMPARISON:  None.  FINDINGS: There is a minimally displaced fracture of the medial malleable is. This finding is associated with widening of the medial tibiotalar joint / ankle mortise. No definite posterior malleolar fracture given obliquity. There is diffuse soft tissue swelling about the ankle. No radiopaque foreign body. Small plantar calcaneal spur.  IMPRESSION: Minimally displaced fracture of the medial malleolus with widening of the ankle mortise worrisome for ligamentous injury. Further evaluation with dedicated radiographs of the proximal tibia/fibula could be obtained to evaluate for a Maisonneuve type injury as clinically indicated.   Electronically Signed   By: Sandi Mariscal M.D.   On: 01/14/2014 18:22     EKG Interpretation None      7:14 PM Discussed case with Dr. Tamera Punt who recommends posterior short leg splint with stirrup and follow up with him in the office tomorrow. Crutches and NWB.   MDM   Final diagnoses:  Closed fibular fracture  Fracture of medial malleolus    Patient presents to ED with proximal fibular fracture, medial malleolus fracture, and widening of the ankle mortise. Patient is neurovascularly intact and compartments is soft. Discussed this case with  orthopedics who recommend follow up in the office tomorrow, NWB status, and posterior splint. Patient was given percocet for pain control. Dr. Ashok Cordia evaluated patient and agrees with plan. Return instructions given. Vital signs stable for discharge. Patient / Family / Caregiver informed of clinical course, understand medical decision-making process, and agree with plan.  I personally performed the services described in this documentation, which was scribed in my presence. The recorded information has been reviewed and is accurate.    Elwyn Lade, PA-C 01/15/14 1407

## 2014-01-14 NOTE — ED Notes (Signed)
Pt was at [employer] when she missed judged the length of the sidewalk and fell. Pt c/o right ankle pain and swelling.  Pt denies taking any blood thinners. Pt denies hitting her head or LOC.

## 2014-01-14 NOTE — ED Notes (Signed)
Pt has a ride home.  

## 2014-01-15 ENCOUNTER — Other Ambulatory Visit: Payer: Self-pay | Admitting: Orthopedic Surgery

## 2014-01-15 DIAGNOSIS — M25579 Pain in unspecified ankle and joints of unspecified foot: Secondary | ICD-10-CM | POA: Diagnosis not present

## 2014-01-16 NOTE — ED Provider Notes (Signed)
Medical screening examination/treatment/procedure(s) were conducted as a shared visit with non-physician practitioner(s) and myself.  I personally evaluated the patient during the encounter.   Pt s/p twisting injury to ankle. Tenderness med and lat malleolus. No open fx. Distal pulses palp. Xrays. Ortho consult.     Mirna Mires, MD 01/16/14 [phone]

## 2014-01-19 ENCOUNTER — Ambulatory Visit (HOSPITAL_BASED_OUTPATIENT_CLINIC_OR_DEPARTMENT_OTHER): Admission: RE | Admit: 2014-01-19 | Payer: Medicare Other | Source: Ambulatory Visit | Admitting: Orthopedic Surgery

## 2014-01-19 ENCOUNTER — Encounter (HOSPITAL_BASED_OUTPATIENT_CLINIC_OR_DEPARTMENT_OTHER): Admission: RE | Payer: Self-pay | Source: Ambulatory Visit

## 2014-01-19 DIAGNOSIS — S8253XA Displaced fracture of medial malleolus of unspecified tibia, initial encounter for closed fracture: Secondary | ICD-10-CM | POA: Diagnosis not present

## 2014-01-19 DIAGNOSIS — Y939 Activity, unspecified: Secondary | ICD-10-CM | POA: Diagnosis not present

## 2014-01-19 DIAGNOSIS — Y999 Unspecified external cause status: Secondary | ICD-10-CM | POA: Diagnosis not present

## 2014-01-19 DIAGNOSIS — Y929 Unspecified place or not applicable: Secondary | ICD-10-CM | POA: Diagnosis not present

## 2014-01-19 DIAGNOSIS — S93439A Sprain of tibiofibular ligament of unspecified ankle, initial encounter: Secondary | ICD-10-CM | POA: Diagnosis not present

## 2014-01-19 DIAGNOSIS — M24873 Other specific joint derangements of unspecified ankle, not elsewhere classified: Secondary | ICD-10-CM | POA: Diagnosis not present

## 2014-01-19 DIAGNOSIS — M24876 Other specific joint derangements of unspecified foot, not elsewhere classified: Secondary | ICD-10-CM | POA: Diagnosis not present

## 2014-01-19 DIAGNOSIS — G8918 Other acute postprocedural pain: Secondary | ICD-10-CM | POA: Diagnosis not present

## 2014-01-19 DIAGNOSIS — W19XXXA Unspecified fall, initial encounter: Secondary | ICD-10-CM | POA: Diagnosis not present

## 2014-01-19 HISTORY — PX: OTHER SURGICAL HISTORY: SHX169

## 2014-01-19 SURGERY — OPEN REDUCTION INTERNAL FIXATION (ORIF) ANKLE FRACTURE
Anesthesia: Choice | Laterality: Right

## 2014-02-03 DIAGNOSIS — M24876 Other specific joint derangements of unspecified foot, not elsewhere classified: Secondary | ICD-10-CM | POA: Diagnosis not present

## 2014-02-03 DIAGNOSIS — M24873 Other specific joint derangements of unspecified ankle, not elsewhere classified: Secondary | ICD-10-CM | POA: Diagnosis not present

## 2014-03-03 DIAGNOSIS — M25579 Pain in unspecified ankle and joints of unspecified foot: Secondary | ICD-10-CM | POA: Diagnosis not present

## 2014-03-24 DIAGNOSIS — M25579 Pain in unspecified ankle and joints of unspecified foot: Secondary | ICD-10-CM | POA: Diagnosis not present

## 2014-03-31 DIAGNOSIS — M25579 Pain in unspecified ankle and joints of unspecified foot: Secondary | ICD-10-CM | POA: Diagnosis not present

## 2014-04-02 DIAGNOSIS — M25579 Pain in unspecified ankle and joints of unspecified foot: Secondary | ICD-10-CM | POA: Diagnosis not present

## 2014-04-05 DIAGNOSIS — M25579 Pain in unspecified ankle and joints of unspecified foot: Secondary | ICD-10-CM | POA: Diagnosis not present

## 2014-04-07 DIAGNOSIS — M25579 Pain in unspecified ankle and joints of unspecified foot: Secondary | ICD-10-CM | POA: Diagnosis not present

## 2014-04-12 DIAGNOSIS — M25579 Pain in unspecified ankle and joints of unspecified foot: Secondary | ICD-10-CM | POA: Diagnosis not present

## 2014-04-14 DIAGNOSIS — M25579 Pain in unspecified ankle and joints of unspecified foot: Secondary | ICD-10-CM | POA: Diagnosis not present

## 2014-04-21 DIAGNOSIS — M25579 Pain in unspecified ankle and joints of unspecified foot: Secondary | ICD-10-CM | POA: Diagnosis not present

## 2014-04-28 DIAGNOSIS — M25579 Pain in unspecified ankle and joints of unspecified foot: Secondary | ICD-10-CM | POA: Diagnosis not present

## 2014-05-18 ENCOUNTER — Other Ambulatory Visit: Payer: Self-pay | Admitting: Gastroenterology

## 2014-05-19 ENCOUNTER — Encounter (HOSPITAL_COMMUNITY): Payer: Self-pay | Admitting: *Deleted

## 2014-05-19 ENCOUNTER — Encounter (HOSPITAL_COMMUNITY): Payer: Self-pay | Admitting: Pharmacy Technician

## 2014-06-07 ENCOUNTER — Ambulatory Visit (HOSPITAL_COMMUNITY): Payer: Medicare Other | Admitting: Anesthesiology

## 2014-06-07 ENCOUNTER — Encounter (HOSPITAL_COMMUNITY): Payer: Self-pay | Admitting: *Deleted

## 2014-06-07 ENCOUNTER — Encounter (HOSPITAL_COMMUNITY)
Admission: RE | Disposition: A | Discharge status: Home / Self Care | Payer: Self-pay | Source: Ambulatory Visit | Attending: Gastroenterology

## 2014-06-07 ENCOUNTER — Ambulatory Visit (HOSPITAL_COMMUNITY)
Admission: RE | Admit: 2014-06-07 | Discharge: 2014-06-07 | Disposition: A | Discharge status: Home / Self Care | Payer: Medicare Other | Source: Ambulatory Visit | Attending: Gastroenterology | Admitting: Gastroenterology

## 2014-06-07 ENCOUNTER — Encounter (HOSPITAL_COMMUNITY): Payer: Medicare Other | Admitting: Anesthesiology

## 2014-06-07 DIAGNOSIS — Z1211 Encounter for screening for malignant neoplasm of colon: Secondary | ICD-10-CM | POA: Diagnosis not present

## 2014-06-07 DIAGNOSIS — I10 Essential (primary) hypertension: Secondary | ICD-10-CM | POA: Diagnosis not present

## 2014-06-07 DIAGNOSIS — Z86010 Personal history of colonic polyps: Secondary | ICD-10-CM | POA: Diagnosis not present

## 2014-06-07 DIAGNOSIS — M8580 Other specified disorders of bone density and structure, unspecified site: Secondary | ICD-10-CM | POA: Insufficient documentation

## 2014-06-07 DIAGNOSIS — K5790 Diverticulosis of intestine, part unspecified, without perforation or abscess without bleeding: Secondary | ICD-10-CM | POA: Diagnosis not present

## 2014-06-07 DIAGNOSIS — K5730 Diverticulosis of large intestine without perforation or abscess without bleeding: Secondary | ICD-10-CM | POA: Insufficient documentation

## 2014-06-07 DIAGNOSIS — Z800 Family history of malignant neoplasm of digestive organs: Secondary | ICD-10-CM | POA: Diagnosis not present

## 2014-06-07 HISTORY — PX: COLONOSCOPY WITH PROPOFOL: SHX5780

## 2014-06-07 HISTORY — DX: Headache: R51

## 2014-06-07 HISTORY — DX: Anemia, unspecified: D64.9

## 2014-06-07 HISTORY — DX: Headache, unspecified: R51.9

## 2014-06-07 SURGERY — COLONOSCOPY WITH PROPOFOL
Anesthesia: Monitor Anesthesia Care

## 2014-06-07 MED ORDER — LACTATED RINGERS IV SOLN
INTRAVENOUS | Status: DC | PRN
  Administered 2014-06-07: 11:00:00 via INTRAVENOUS

## 2014-06-07 MED ORDER — LIDOCAINE HCL (CARDIAC) 20 MG/ML IV SOLN
INTRAVENOUS | Status: DC | PRN
  Administered 2014-06-07: 50 mg via INTRAVENOUS

## 2014-06-07 MED ORDER — PROPOFOL INFUSION 10 MG/ML OPTIME
INTRAVENOUS | Status: DC | PRN
  Administered 2014-06-07: 140 ug/kg/min via INTRAVENOUS

## 2014-06-07 MED ORDER — PROPOFOL 10 MG/ML IV BOLUS
INTRAVENOUS | Status: DC | PRN
  Administered 2014-06-07 (×2): 50 mg via INTRAVENOUS

## 2014-06-07 MED ORDER — SODIUM CHLORIDE 0.9 % IV SOLN: INTRAVENOUS | Status: DC

## 2014-06-07 MED ORDER — PROPOFOL 10 MG/ML IV BOLUS
INTRAVENOUS | Status: AC
  Filled 2014-06-07: qty 20

## 2014-06-07 MED ORDER — LIDOCAINE HCL (CARDIAC) 20 MG/ML IV SOLN
INTRAVENOUS | Status: AC
  Filled 2014-06-07: qty 5

## 2014-06-07 SURGICAL SUPPLY — 21 items

## 2014-06-07 NOTE — Anesthesia Postprocedure Evaluation (Signed)
Anesthesia Post Note  Patient: Wendy Fleming  Procedure(s) Performed: Procedure(s) (LRB): COLONOSCOPY WITH PROPOFOL (N/A)  Anesthesia type: MAC  Patient location: PACU  Post pain: Pain level controlled  Post assessment: Post-op Vital signs reviewed  Last Vitals: BP 142/77  Pulse 48  Temp(Src) 36.5 C (Oral)  Resp 21  Ht 5\' 2"  (1.575 m)  Wt 180 lb (81.647 kg)  BMI 32.91 kg/m2  SpO2 100%  Post vital signs: Reviewed  Level of consciousness: awake  Complications: No apparent anesthesia complications

## 2014-06-07 NOTE — H&P (Signed)
  Procedure: Surveillance colonoscopy. Adenomatous colon polyps removed colonoscopically in the past. Normal surveillance colonoscopy performed on 04/03/2010.  History: The patient is a 73 year old female born 1941-02-23. She has had adenomatous colon polyps removed colonoscopically in the past. She underwent a normal surveillance colonoscopy on 04/03/2010.  The patient is scheduled to undergo a surveillance colonoscopy today.  Medication allergies: Penicillin. Sulfa. Tetracycline. Erythromycin. Streptomycin.  Past medical history: Hypertension. Cholecystectomy for gallstones. Personal history of adenomatous colon polyps. Fractured left shoulder in 2013. Osteopenia. Left shoulder surgery. Tubal ligation.  Family history: Father diagnosed with colon cancer  Exam: The patient is alert and lying comfortably on the endoscopy stretcher. Abdomen is soft and nontender to palpation. Lungs are clear to auscultation. Cardiac exam reveals a regular rhythm.  Plan: Proceed with surveillance colonoscopy

## 2014-06-07 NOTE — Transfer of Care (Signed)
Immediate Anesthesia Transfer of Care Note  Patient: Wendy Fleming  Procedure(s) Performed: Procedure(s): COLONOSCOPY WITH PROPOFOL (N/A)  Patient Location: PACU  Anesthesia Type:MAC  Level of Consciousness: awake  Airway & Oxygen Therapy: Patient Spontanous Breathing  Post-op Assessment: Report given to PACU RN and Post -op Vital signs reviewed and stable  Post vital signs: Reviewed and stable  Complications: No apparent anesthesia complications

## 2014-06-07 NOTE — Discharge Instructions (Signed)

## 2014-06-07 NOTE — Anesthesia Preprocedure Evaluation (Signed)
Anesthesia Evaluation  Patient identified by MRN, date of birth, ID band Patient awake    Reviewed: Allergy & Precautions, H&P , NPO status , Patient's Chart, lab work & pertinent test results  Airway Mallampati: II  TM Distance: >3 FB Neck ROM: Full    Dental no notable dental hx.    Pulmonary neg pulmonary ROS, former smoker,  breath sounds clear to auscultation  Pulmonary exam normal       Cardiovascular hypertension, Pt. on medications Rhythm:Regular Rate:Normal     Neuro/Psych  Headaches, negative psych ROS   GI/Hepatic negative GI ROS, Neg liver ROS,   Endo/Other  negative endocrine ROS  Renal/GU negative Renal ROS     Musculoskeletal  (+) Arthritis -,   Abdominal (+) + obese,   Peds  Hematology  (+) anemia ,   Anesthesia Other Findings   Reproductive/Obstetrics negative OB ROS                             Anesthesia Physical Anesthesia Plan  ASA: II  Anesthesia Plan: MAC   Post-op Pain Management:    Induction: Intravenous  Airway Management Planned:   Additional Equipment:   Intra-op Plan:   Post-operative Plan:   Informed Consent: I have reviewed the patients History and Physical, chart, labs and discussed the procedure including the risks, benefits and alternatives for the proposed anesthesia with the patient or authorized representative who has indicated his/her understanding and acceptance.   Dental advisory given  Plan Discussed with: CRNA  Anesthesia Plan Comments:         Anesthesia Quick Evaluation

## 2014-06-07 NOTE — Op Note (Signed)
Procedure: Surveillance colonoscopy. Personal history of adenomatous colon polyps removed colonoscopically.  Endoscopist: Earle Gell  Premedication: Propofol administered by anesthesia  Procedure: The patient was placed in the left lateral decubitus position. Anal inspection and digital rectal exam were normal. The Pentax pediatric colonoscope was introduced into the rectum and advanced to the cecum. A normal-appearing appendiceal orifice was identified. A normal-appearing ileocecal valve was identified. Colonic preparation for the exam today was good.  Rectum. Normal. Retroflexed view of the distal rectum normal  Sigmoid colon and descending colon. A few small diverticula were noted.  Splenic flexure. Normal  Transverse colon. Normal  Hepatic flexure. Normal  Ascending colon. Normal  Cecum and ileocecal valve. Normal  Assessment: Normal surveillance colonoscopy  Recommendation: Schedule surveillance colonoscopy in 5 years

## 2014-06-08 ENCOUNTER — Encounter (HOSPITAL_COMMUNITY): Payer: Self-pay | Admitting: Gastroenterology

## 2014-08-09 DIAGNOSIS — T1502XA Foreign body in cornea, left eye, initial encounter: Secondary | ICD-10-CM | POA: Diagnosis not present

## 2014-09-23 DIAGNOSIS — H40013 Open angle with borderline findings, low risk, bilateral: Secondary | ICD-10-CM | POA: Diagnosis not present

## 2014-10-13 DIAGNOSIS — N390 Urinary tract infection, site not specified: Secondary | ICD-10-CM | POA: Diagnosis not present

## 2014-10-13 DIAGNOSIS — R300 Dysuria: Secondary | ICD-10-CM | POA: Diagnosis not present

## 2014-10-15 ENCOUNTER — Other Ambulatory Visit: Payer: Self-pay

## 2014-10-15 DIAGNOSIS — Z1231 Encounter for screening mammogram for malignant neoplasm of breast: Secondary | ICD-10-CM

## 2014-10-27 ENCOUNTER — Ambulatory Visit
Admission: RE | Admit: 2014-10-27 | Discharge: 2014-10-27 | Disposition: A | Discharge status: Home / Self Care | Payer: Medicare Other | Source: Ambulatory Visit

## 2014-10-27 DIAGNOSIS — Z1231 Encounter for screening mammogram for malignant neoplasm of breast: Secondary | ICD-10-CM | POA: Diagnosis not present

## 2014-12-30 DIAGNOSIS — J069 Acute upper respiratory infection, unspecified: Secondary | ICD-10-CM | POA: Diagnosis not present

## 2015-05-09 DIAGNOSIS — Z79899 Other long term (current) drug therapy: Secondary | ICD-10-CM | POA: Diagnosis not present

## 2015-05-09 DIAGNOSIS — I10 Essential (primary) hypertension: Secondary | ICD-10-CM | POA: Diagnosis not present

## 2015-05-09 DIAGNOSIS — F418 Other specified anxiety disorders: Secondary | ICD-10-CM | POA: Diagnosis not present

## 2015-05-09 DIAGNOSIS — Z23 Encounter for immunization: Secondary | ICD-10-CM | POA: Diagnosis not present

## 2015-05-09 DIAGNOSIS — H6590 Unspecified nonsuppurative otitis media, unspecified ear: Secondary | ICD-10-CM | POA: Diagnosis not present

## 2015-05-09 DIAGNOSIS — M8588 Other specified disorders of bone density and structure, other site: Secondary | ICD-10-CM | POA: Diagnosis not present

## 2015-07-14 DIAGNOSIS — L989 Disorder of the skin and subcutaneous tissue, unspecified: Secondary | ICD-10-CM | POA: Diagnosis not present

## 2015-08-22 DIAGNOSIS — D1801 Hemangioma of skin and subcutaneous tissue: Secondary | ICD-10-CM | POA: Diagnosis not present

## 2015-08-22 DIAGNOSIS — Z85828 Personal history of other malignant neoplasm of skin: Secondary | ICD-10-CM | POA: Diagnosis not present

## 2015-08-22 DIAGNOSIS — D225 Melanocytic nevi of trunk: Secondary | ICD-10-CM | POA: Diagnosis not present

## 2015-08-22 DIAGNOSIS — L821 Other seborrheic keratosis: Secondary | ICD-10-CM | POA: Diagnosis not present

## 2015-08-22 DIAGNOSIS — C44722 Squamous cell carcinoma of skin of right lower limb, including hip: Secondary | ICD-10-CM | POA: Diagnosis not present

## 2015-09-19 ENCOUNTER — Other Ambulatory Visit: Payer: Self-pay

## 2015-09-19 DIAGNOSIS — Z1231 Encounter for screening mammogram for malignant neoplasm of breast: Secondary | ICD-10-CM

## 2015-10-28 ENCOUNTER — Ambulatory Visit
Admission: RE | Admit: 2015-10-28 | Discharge: 2015-10-28 | Disposition: A | Discharge status: Home / Self Care | Payer: Medicare Other | Source: Ambulatory Visit

## 2015-10-28 DIAGNOSIS — Z1231 Encounter for screening mammogram for malignant neoplasm of breast: Secondary | ICD-10-CM | POA: Diagnosis not present

## 2015-11-09 DIAGNOSIS — Z79899 Other long term (current) drug therapy: Secondary | ICD-10-CM | POA: Diagnosis not present

## 2015-11-09 DIAGNOSIS — M8588 Other specified disorders of bone density and structure, other site: Secondary | ICD-10-CM | POA: Diagnosis not present

## 2015-11-09 DIAGNOSIS — I10 Essential (primary) hypertension: Secondary | ICD-10-CM | POA: Diagnosis not present

## 2015-11-09 DIAGNOSIS — Z800 Family history of malignant neoplasm of digestive organs: Secondary | ICD-10-CM | POA: Diagnosis not present

## 2015-11-09 DIAGNOSIS — R0989 Other specified symptoms and signs involving the circulatory and respiratory systems: Secondary | ICD-10-CM | POA: Diagnosis not present

## 2015-11-09 DIAGNOSIS — Z86010 Personal history of colonic polyps: Secondary | ICD-10-CM | POA: Diagnosis not present

## 2015-11-09 DIAGNOSIS — E7800 Pure hypercholesterolemia, unspecified: Secondary | ICD-10-CM | POA: Diagnosis not present

## 2015-11-09 DIAGNOSIS — Z0000 Encounter for general adult medical examination without abnormal findings: Secondary | ICD-10-CM | POA: Diagnosis not present

## 2015-11-09 DIAGNOSIS — Z720 Tobacco use: Secondary | ICD-10-CM | POA: Diagnosis not present

## 2015-11-09 DIAGNOSIS — Z1389 Encounter for screening for other disorder: Secondary | ICD-10-CM | POA: Diagnosis not present

## 2015-11-10 ENCOUNTER — Other Ambulatory Visit: Payer: Self-pay | Admitting: Family Medicine

## 2015-11-10 DIAGNOSIS — M8580 Other specified disorders of bone density and structure, unspecified site: Secondary | ICD-10-CM

## 2015-11-10 DIAGNOSIS — R0989 Other specified symptoms and signs involving the circulatory and respiratory systems: Secondary | ICD-10-CM

## 2015-11-16 ENCOUNTER — Ambulatory Visit
Admission: RE | Admit: 2015-11-16 | Discharge: 2015-11-16 | Disposition: A | Discharge status: Home / Self Care | Payer: Medicare Other | Source: Ambulatory Visit | Attending: Family Medicine | Admitting: Family Medicine

## 2015-11-16 DIAGNOSIS — I6523 Occlusion and stenosis of bilateral carotid arteries: Secondary | ICD-10-CM | POA: Diagnosis not present

## 2015-11-16 DIAGNOSIS — R0989 Other specified symptoms and signs involving the circulatory and respiratory systems: Secondary | ICD-10-CM

## 2015-11-25 ENCOUNTER — Ambulatory Visit
Admission: RE | Admit: 2015-11-25 | Discharge: 2015-11-25 | Disposition: A | Discharge status: Home / Self Care | Payer: Medicare Other | Source: Ambulatory Visit | Attending: Family Medicine | Admitting: Family Medicine

## 2015-11-25 DIAGNOSIS — M8589 Other specified disorders of bone density and structure, multiple sites: Secondary | ICD-10-CM | POA: Diagnosis not present

## 2015-11-25 DIAGNOSIS — M8580 Other specified disorders of bone density and structure, unspecified site: Secondary | ICD-10-CM

## 2015-11-25 DIAGNOSIS — Z780 Asymptomatic menopausal state: Secondary | ICD-10-CM | POA: Diagnosis not present

## 2016-05-31 DIAGNOSIS — Z23 Encounter for immunization: Secondary | ICD-10-CM | POA: Diagnosis not present

## 2016-09-21 ENCOUNTER — Other Ambulatory Visit: Payer: Self-pay | Admitting: Family Medicine

## 2016-09-21 DIAGNOSIS — Z1231 Encounter for screening mammogram for malignant neoplasm of breast: Secondary | ICD-10-CM

## 2016-10-29 ENCOUNTER — Ambulatory Visit
Admission: RE | Admit: 2016-10-29 | Discharge: 2016-10-29 | Disposition: A | Discharge status: Home / Self Care | Payer: Medicare Other | Source: Ambulatory Visit | Attending: Family Medicine | Admitting: Family Medicine

## 2016-10-29 DIAGNOSIS — Z1231 Encounter for screening mammogram for malignant neoplasm of breast: Secondary | ICD-10-CM

## 2016-11-12 ENCOUNTER — Other Ambulatory Visit: Payer: Self-pay | Admitting: Family Medicine

## 2016-11-12 ENCOUNTER — Telehealth: Payer: Self-pay | Admitting: *Deleted

## 2016-11-12 ENCOUNTER — Other Ambulatory Visit (HOSPITAL_COMMUNITY)
Admission: RE | Admit: 2016-11-12 | Discharge: 2016-11-12 | Disposition: A | Discharge status: Home / Self Care | Payer: Medicare Other | Source: Ambulatory Visit | Attending: Family Medicine | Admitting: Family Medicine

## 2016-11-12 DIAGNOSIS — Z0000 Encounter for general adult medical examination without abnormal findings: Secondary | ICD-10-CM | POA: Diagnosis not present

## 2016-11-12 DIAGNOSIS — Z79899 Other long term (current) drug therapy: Secondary | ICD-10-CM | POA: Diagnosis not present

## 2016-11-12 DIAGNOSIS — Z86010 Personal history of colonic polyps: Secondary | ICD-10-CM | POA: Diagnosis not present

## 2016-11-12 DIAGNOSIS — Z803 Family history of malignant neoplasm of breast: Secondary | ICD-10-CM | POA: Diagnosis not present

## 2016-11-12 DIAGNOSIS — Z124 Encounter for screening for malignant neoplasm of cervix: Secondary | ICD-10-CM | POA: Insufficient documentation

## 2016-11-12 DIAGNOSIS — N63 Unspecified lump in unspecified breast: Secondary | ICD-10-CM | POA: Diagnosis not present

## 2016-11-12 DIAGNOSIS — R011 Cardiac murmur, unspecified: Secondary | ICD-10-CM | POA: Diagnosis not present

## 2016-11-12 DIAGNOSIS — M8588 Other specified disorders of bone density and structure, other site: Secondary | ICD-10-CM | POA: Diagnosis not present

## 2016-11-12 DIAGNOSIS — Z8589 Personal history of malignant neoplasm of other organs and systems: Secondary | ICD-10-CM | POA: Diagnosis not present

## 2016-11-12 DIAGNOSIS — N6315 Unspecified lump in the right breast, overlapping quadrants: Secondary | ICD-10-CM

## 2016-11-12 DIAGNOSIS — N6310 Unspecified lump in the right breast, unspecified quadrant: Principal | ICD-10-CM

## 2016-11-12 DIAGNOSIS — N819 Female genital prolapse, unspecified: Secondary | ICD-10-CM | POA: Diagnosis not present

## 2016-11-12 DIAGNOSIS — N889 Noninflammatory disorder of cervix uteri, unspecified: Secondary | ICD-10-CM | POA: Diagnosis not present

## 2016-11-12 DIAGNOSIS — I10 Essential (primary) hypertension: Secondary | ICD-10-CM | POA: Diagnosis not present

## 2016-11-12 NOTE — Telephone Encounter (Signed)
NOTES SENT TO SCHEDULING.  °

## 2016-11-13 LAB — CYTOLOGY - PAP: Diagnosis: NEGATIVE

## 2016-11-14 ENCOUNTER — Ambulatory Visit
Admission: RE | Admit: 2016-11-14 | Discharge: 2016-11-14 | Disposition: A | Discharge status: Home / Self Care | Payer: Medicare Other | Source: Ambulatory Visit | Attending: Family Medicine | Admitting: Family Medicine

## 2016-11-14 DIAGNOSIS — N6489 Other specified disorders of breast: Secondary | ICD-10-CM | POA: Diagnosis not present

## 2016-11-14 DIAGNOSIS — R928 Other abnormal and inconclusive findings on diagnostic imaging of breast: Secondary | ICD-10-CM | POA: Diagnosis not present

## 2016-11-14 DIAGNOSIS — N6310 Unspecified lump in the right breast, unspecified quadrant: Principal | ICD-10-CM

## 2016-11-14 DIAGNOSIS — N6315 Unspecified lump in the right breast, overlapping quadrants: Secondary | ICD-10-CM

## 2016-11-27 ENCOUNTER — Other Ambulatory Visit (HOSPITAL_COMMUNITY): Payer: Medicare Other

## 2016-12-06 ENCOUNTER — Other Ambulatory Visit: Payer: Self-pay

## 2016-12-06 ENCOUNTER — Ambulatory Visit (HOSPITAL_COMMUNITY): Payer: Medicare Other | Attending: Cardiovascular Disease

## 2016-12-06 DIAGNOSIS — R011 Cardiac murmur, unspecified: Secondary | ICD-10-CM | POA: Diagnosis not present

## 2016-12-06 DIAGNOSIS — Z8249 Family history of ischemic heart disease and other diseases of the circulatory system: Secondary | ICD-10-CM | POA: Diagnosis not present

## 2016-12-06 DIAGNOSIS — Z720 Tobacco use: Secondary | ICD-10-CM | POA: Insufficient documentation

## 2016-12-06 DIAGNOSIS — I10 Essential (primary) hypertension: Secondary | ICD-10-CM | POA: Diagnosis not present

## 2016-12-06 DIAGNOSIS — I340 Nonrheumatic mitral (valve) insufficiency: Secondary | ICD-10-CM | POA: Insufficient documentation

## 2016-12-06 DIAGNOSIS — I351 Nonrheumatic aortic (valve) insufficiency: Secondary | ICD-10-CM | POA: Diagnosis not present

## 2017-05-14 DIAGNOSIS — M79605 Pain in left leg: Secondary | ICD-10-CM | POA: Diagnosis not present

## 2017-05-14 DIAGNOSIS — Z79899 Other long term (current) drug therapy: Secondary | ICD-10-CM | POA: Diagnosis not present

## 2017-05-14 DIAGNOSIS — M25512 Pain in left shoulder: Secondary | ICD-10-CM | POA: Diagnosis not present

## 2017-05-14 DIAGNOSIS — I10 Essential (primary) hypertension: Secondary | ICD-10-CM | POA: Diagnosis not present

## 2017-05-14 DIAGNOSIS — Z23 Encounter for immunization: Secondary | ICD-10-CM | POA: Diagnosis not present

## 2017-09-25 ENCOUNTER — Other Ambulatory Visit: Payer: Self-pay | Admitting: Family Medicine

## 2017-09-25 DIAGNOSIS — Z1231 Encounter for screening mammogram for malignant neoplasm of breast: Secondary | ICD-10-CM

## 2017-10-08 DIAGNOSIS — R05 Cough: Secondary | ICD-10-CM | POA: Diagnosis not present

## 2017-10-08 DIAGNOSIS — J101 Influenza due to other identified influenza virus with other respiratory manifestations: Secondary | ICD-10-CM | POA: Diagnosis not present

## 2017-11-11 ENCOUNTER — Ambulatory Visit
Admission: RE | Admit: 2017-11-11 | Discharge: 2017-11-11 | Disposition: A | Discharge status: Home / Self Care | Payer: Medicare Other | Source: Ambulatory Visit | Attending: Family Medicine | Admitting: Family Medicine

## 2017-11-11 DIAGNOSIS — Z1231 Encounter for screening mammogram for malignant neoplasm of breast: Secondary | ICD-10-CM | POA: Diagnosis not present

## 2017-12-02 DIAGNOSIS — Z0000 Encounter for general adult medical examination without abnormal findings: Secondary | ICD-10-CM | POA: Diagnosis not present

## 2017-12-02 DIAGNOSIS — I10 Essential (primary) hypertension: Secondary | ICD-10-CM | POA: Diagnosis not present

## 2017-12-02 DIAGNOSIS — Z8589 Personal history of malignant neoplasm of other organs and systems: Secondary | ICD-10-CM | POA: Diagnosis not present

## 2017-12-02 DIAGNOSIS — M8588 Other specified disorders of bone density and structure, other site: Secondary | ICD-10-CM | POA: Diagnosis not present

## 2017-12-02 DIAGNOSIS — Z1389 Encounter for screening for other disorder: Secondary | ICD-10-CM | POA: Diagnosis not present

## 2017-12-02 DIAGNOSIS — Z23 Encounter for immunization: Secondary | ICD-10-CM | POA: Diagnosis not present

## 2017-12-02 DIAGNOSIS — Z79899 Other long term (current) drug therapy: Secondary | ICD-10-CM | POA: Diagnosis not present

## 2017-12-02 DIAGNOSIS — Z86010 Personal history of colonic polyps: Secondary | ICD-10-CM | POA: Diagnosis not present

## 2017-12-04 ENCOUNTER — Other Ambulatory Visit: Payer: Self-pay | Admitting: Family Medicine

## 2017-12-04 DIAGNOSIS — M8580 Other specified disorders of bone density and structure, unspecified site: Secondary | ICD-10-CM

## 2018-01-09 ENCOUNTER — Ambulatory Visit
Admission: RE | Admit: 2018-01-09 | Discharge: 2018-01-09 | Disposition: A | Discharge status: Home / Self Care | Payer: Medicare Other | Source: Ambulatory Visit | Attending: Family Medicine | Admitting: Family Medicine

## 2018-01-09 DIAGNOSIS — Z780 Asymptomatic menopausal state: Secondary | ICD-10-CM | POA: Diagnosis not present

## 2018-01-09 DIAGNOSIS — M8588 Other specified disorders of bone density and structure, other site: Secondary | ICD-10-CM | POA: Diagnosis not present

## 2018-01-09 DIAGNOSIS — M8580 Other specified disorders of bone density and structure, unspecified site: Secondary | ICD-10-CM

## 2018-01-09 DIAGNOSIS — M810 Age-related osteoporosis without current pathological fracture: Secondary | ICD-10-CM | POA: Diagnosis not present

## 2018-02-06 DIAGNOSIS — M810 Age-related osteoporosis without current pathological fracture: Secondary | ICD-10-CM | POA: Diagnosis not present

## 2018-05-04 DIAGNOSIS — S5002XA Contusion of left elbow, initial encounter: Secondary | ICD-10-CM | POA: Diagnosis not present

## 2018-05-04 DIAGNOSIS — W19XXXA Unspecified fall, initial encounter: Secondary | ICD-10-CM | POA: Diagnosis not present

## 2018-05-05 DIAGNOSIS — M25522 Pain in left elbow: Secondary | ICD-10-CM | POA: Diagnosis not present

## 2018-05-13 DIAGNOSIS — Z23 Encounter for immunization: Secondary | ICD-10-CM | POA: Diagnosis not present

## 2018-10-15 ENCOUNTER — Other Ambulatory Visit: Payer: Self-pay | Admitting: Family Medicine

## 2018-10-15 DIAGNOSIS — Z1231 Encounter for screening mammogram for malignant neoplasm of breast: Secondary | ICD-10-CM

## 2018-12-12 ENCOUNTER — Ambulatory Visit: Payer: Medicare Other

## 2019-01-22 DIAGNOSIS — I10 Essential (primary) hypertension: Secondary | ICD-10-CM | POA: Diagnosis not present

## 2019-01-22 DIAGNOSIS — Z1389 Encounter for screening for other disorder: Secondary | ICD-10-CM | POA: Diagnosis not present

## 2019-01-22 DIAGNOSIS — M810 Age-related osteoporosis without current pathological fracture: Secondary | ICD-10-CM | POA: Diagnosis not present

## 2019-01-22 DIAGNOSIS — Z1211 Encounter for screening for malignant neoplasm of colon: Secondary | ICD-10-CM | POA: Diagnosis not present

## 2019-01-22 DIAGNOSIS — Z8589 Personal history of malignant neoplasm of other organs and systems: Secondary | ICD-10-CM | POA: Diagnosis not present

## 2019-01-22 DIAGNOSIS — Z0000 Encounter for general adult medical examination without abnormal findings: Secondary | ICD-10-CM | POA: Diagnosis not present

## 2019-01-27 ENCOUNTER — Other Ambulatory Visit: Payer: Self-pay | Admitting: Family Medicine

## 2019-01-27 DIAGNOSIS — M810 Age-related osteoporosis without current pathological fracture: Secondary | ICD-10-CM

## 2019-05-15 DIAGNOSIS — Z23 Encounter for immunization: Secondary | ICD-10-CM | POA: Diagnosis not present

## 2019-08-28 DIAGNOSIS — L821 Other seborrheic keratosis: Secondary | ICD-10-CM | POA: Diagnosis not present

## 2019-08-28 DIAGNOSIS — L570 Actinic keratosis: Secondary | ICD-10-CM | POA: Diagnosis not present

## 2019-08-28 DIAGNOSIS — L814 Other melanin hyperpigmentation: Secondary | ICD-10-CM | POA: Diagnosis not present

## 2019-08-28 DIAGNOSIS — Z85828 Personal history of other malignant neoplasm of skin: Secondary | ICD-10-CM | POA: Diagnosis not present

## 2019-08-28 DIAGNOSIS — C44619 Basal cell carcinoma of skin of left upper limb, including shoulder: Secondary | ICD-10-CM | POA: Diagnosis not present

## 2019-08-28 DIAGNOSIS — D1801 Hemangioma of skin and subcutaneous tissue: Secondary | ICD-10-CM | POA: Diagnosis not present

## 2019-08-28 DIAGNOSIS — D2262 Melanocytic nevi of left upper limb, including shoulder: Secondary | ICD-10-CM | POA: Diagnosis not present

## 2019-11-30 DIAGNOSIS — M9907 Segmental and somatic dysfunction of upper extremity: Secondary | ICD-10-CM | POA: Diagnosis not present

## 2019-11-30 DIAGNOSIS — S83421A Sprain of lateral collateral ligament of right knee, initial encounter: Secondary | ICD-10-CM | POA: Diagnosis not present

## 2019-11-30 DIAGNOSIS — M9902 Segmental and somatic dysfunction of thoracic region: Secondary | ICD-10-CM | POA: Diagnosis not present

## 2019-11-30 DIAGNOSIS — M5442 Lumbago with sciatica, left side: Secondary | ICD-10-CM | POA: Diagnosis not present

## 2019-12-01 DIAGNOSIS — M5442 Lumbago with sciatica, left side: Secondary | ICD-10-CM | POA: Diagnosis not present

## 2019-12-01 DIAGNOSIS — M9907 Segmental and somatic dysfunction of upper extremity: Secondary | ICD-10-CM | POA: Diagnosis not present

## 2019-12-01 DIAGNOSIS — S83421A Sprain of lateral collateral ligament of right knee, initial encounter: Secondary | ICD-10-CM | POA: Diagnosis not present

## 2019-12-01 DIAGNOSIS — M9902 Segmental and somatic dysfunction of thoracic region: Secondary | ICD-10-CM | POA: Diagnosis not present

## 2019-12-02 DIAGNOSIS — M5442 Lumbago with sciatica, left side: Secondary | ICD-10-CM | POA: Diagnosis not present

## 2019-12-02 DIAGNOSIS — S83421A Sprain of lateral collateral ligament of right knee, initial encounter: Secondary | ICD-10-CM | POA: Diagnosis not present

## 2019-12-02 DIAGNOSIS — M9902 Segmental and somatic dysfunction of thoracic region: Secondary | ICD-10-CM | POA: Diagnosis not present

## 2019-12-02 DIAGNOSIS — M9907 Segmental and somatic dysfunction of upper extremity: Secondary | ICD-10-CM | POA: Diagnosis not present

## 2019-12-14 DIAGNOSIS — S83421A Sprain of lateral collateral ligament of right knee, initial encounter: Secondary | ICD-10-CM | POA: Diagnosis not present

## 2019-12-14 DIAGNOSIS — M9902 Segmental and somatic dysfunction of thoracic region: Secondary | ICD-10-CM | POA: Diagnosis not present

## 2019-12-14 DIAGNOSIS — M5442 Lumbago with sciatica, left side: Secondary | ICD-10-CM | POA: Diagnosis not present

## 2019-12-14 DIAGNOSIS — M9907 Segmental and somatic dysfunction of upper extremity: Secondary | ICD-10-CM | POA: Diagnosis not present

## 2019-12-15 DIAGNOSIS — M5442 Lumbago with sciatica, left side: Secondary | ICD-10-CM | POA: Diagnosis not present

## 2019-12-15 DIAGNOSIS — M9907 Segmental and somatic dysfunction of upper extremity: Secondary | ICD-10-CM | POA: Diagnosis not present

## 2019-12-15 DIAGNOSIS — M9902 Segmental and somatic dysfunction of thoracic region: Secondary | ICD-10-CM | POA: Diagnosis not present

## 2019-12-15 DIAGNOSIS — S83421A Sprain of lateral collateral ligament of right knee, initial encounter: Secondary | ICD-10-CM | POA: Diagnosis not present

## 2019-12-16 DIAGNOSIS — M9907 Segmental and somatic dysfunction of upper extremity: Secondary | ICD-10-CM | POA: Diagnosis not present

## 2019-12-16 DIAGNOSIS — S83421A Sprain of lateral collateral ligament of right knee, initial encounter: Secondary | ICD-10-CM | POA: Diagnosis not present

## 2019-12-16 DIAGNOSIS — M9902 Segmental and somatic dysfunction of thoracic region: Secondary | ICD-10-CM | POA: Diagnosis not present

## 2019-12-16 DIAGNOSIS — M5442 Lumbago with sciatica, left side: Secondary | ICD-10-CM | POA: Diagnosis not present

## 2020-01-04 DIAGNOSIS — M9902 Segmental and somatic dysfunction of thoracic region: Secondary | ICD-10-CM | POA: Diagnosis not present

## 2020-01-04 DIAGNOSIS — M9907 Segmental and somatic dysfunction of upper extremity: Secondary | ICD-10-CM | POA: Diagnosis not present

## 2020-01-04 DIAGNOSIS — S83421A Sprain of lateral collateral ligament of right knee, initial encounter: Secondary | ICD-10-CM | POA: Diagnosis not present

## 2020-01-04 DIAGNOSIS — M5442 Lumbago with sciatica, left side: Secondary | ICD-10-CM | POA: Diagnosis not present

## 2020-01-05 DIAGNOSIS — S83421A Sprain of lateral collateral ligament of right knee, initial encounter: Secondary | ICD-10-CM | POA: Diagnosis not present

## 2020-01-05 DIAGNOSIS — M9907 Segmental and somatic dysfunction of upper extremity: Secondary | ICD-10-CM | POA: Diagnosis not present

## 2020-01-05 DIAGNOSIS — M9902 Segmental and somatic dysfunction of thoracic region: Secondary | ICD-10-CM | POA: Diagnosis not present

## 2020-01-05 DIAGNOSIS — M5442 Lumbago with sciatica, left side: Secondary | ICD-10-CM | POA: Diagnosis not present

## 2020-01-06 DIAGNOSIS — M9907 Segmental and somatic dysfunction of upper extremity: Secondary | ICD-10-CM | POA: Diagnosis not present

## 2020-01-06 DIAGNOSIS — M5442 Lumbago with sciatica, left side: Secondary | ICD-10-CM | POA: Diagnosis not present

## 2020-01-06 DIAGNOSIS — M9902 Segmental and somatic dysfunction of thoracic region: Secondary | ICD-10-CM | POA: Diagnosis not present

## 2020-01-06 DIAGNOSIS — S83421A Sprain of lateral collateral ligament of right knee, initial encounter: Secondary | ICD-10-CM | POA: Diagnosis not present

## 2020-01-12 DIAGNOSIS — S83421A Sprain of lateral collateral ligament of right knee, initial encounter: Secondary | ICD-10-CM | POA: Diagnosis not present

## 2020-01-12 DIAGNOSIS — M9902 Segmental and somatic dysfunction of thoracic region: Secondary | ICD-10-CM | POA: Diagnosis not present

## 2020-01-12 DIAGNOSIS — M5442 Lumbago with sciatica, left side: Secondary | ICD-10-CM | POA: Diagnosis not present

## 2020-01-12 DIAGNOSIS — M9907 Segmental and somatic dysfunction of upper extremity: Secondary | ICD-10-CM | POA: Diagnosis not present

## 2020-01-13 DIAGNOSIS — M9902 Segmental and somatic dysfunction of thoracic region: Secondary | ICD-10-CM | POA: Diagnosis not present

## 2020-01-13 DIAGNOSIS — M9907 Segmental and somatic dysfunction of upper extremity: Secondary | ICD-10-CM | POA: Diagnosis not present

## 2020-01-13 DIAGNOSIS — S83421A Sprain of lateral collateral ligament of right knee, initial encounter: Secondary | ICD-10-CM | POA: Diagnosis not present

## 2020-01-13 DIAGNOSIS — M5442 Lumbago with sciatica, left side: Secondary | ICD-10-CM | POA: Diagnosis not present

## 2020-01-18 DIAGNOSIS — M9907 Segmental and somatic dysfunction of upper extremity: Secondary | ICD-10-CM | POA: Diagnosis not present

## 2020-01-18 DIAGNOSIS — M9902 Segmental and somatic dysfunction of thoracic region: Secondary | ICD-10-CM | POA: Diagnosis not present

## 2020-01-18 DIAGNOSIS — S83421A Sprain of lateral collateral ligament of right knee, initial encounter: Secondary | ICD-10-CM | POA: Diagnosis not present

## 2020-01-18 DIAGNOSIS — M5442 Lumbago with sciatica, left side: Secondary | ICD-10-CM | POA: Diagnosis not present

## 2020-01-20 DIAGNOSIS — M9907 Segmental and somatic dysfunction of upper extremity: Secondary | ICD-10-CM | POA: Diagnosis not present

## 2020-01-20 DIAGNOSIS — M5442 Lumbago with sciatica, left side: Secondary | ICD-10-CM | POA: Diagnosis not present

## 2020-01-20 DIAGNOSIS — M9902 Segmental and somatic dysfunction of thoracic region: Secondary | ICD-10-CM | POA: Diagnosis not present

## 2020-01-20 DIAGNOSIS — S83421A Sprain of lateral collateral ligament of right knee, initial encounter: Secondary | ICD-10-CM | POA: Diagnosis not present

## 2020-01-25 DIAGNOSIS — Z86010 Personal history of colonic polyps: Secondary | ICD-10-CM | POA: Diagnosis not present

## 2020-01-25 DIAGNOSIS — M9907 Segmental and somatic dysfunction of upper extremity: Secondary | ICD-10-CM | POA: Diagnosis not present

## 2020-01-25 DIAGNOSIS — Z79899 Other long term (current) drug therapy: Secondary | ICD-10-CM | POA: Diagnosis not present

## 2020-01-25 DIAGNOSIS — M5442 Lumbago with sciatica, left side: Secondary | ICD-10-CM | POA: Diagnosis not present

## 2020-01-25 DIAGNOSIS — M810 Age-related osteoporosis without current pathological fracture: Secondary | ICD-10-CM | POA: Diagnosis not present

## 2020-01-25 DIAGNOSIS — I10 Essential (primary) hypertension: Secondary | ICD-10-CM | POA: Diagnosis not present

## 2020-01-25 DIAGNOSIS — Z0000 Encounter for general adult medical examination without abnormal findings: Secondary | ICD-10-CM | POA: Diagnosis not present

## 2020-01-25 DIAGNOSIS — Z8589 Personal history of malignant neoplasm of other organs and systems: Secondary | ICD-10-CM | POA: Diagnosis not present

## 2020-01-25 DIAGNOSIS — Z1239 Encounter for other screening for malignant neoplasm of breast: Secondary | ICD-10-CM | POA: Diagnosis not present

## 2020-01-25 DIAGNOSIS — S83421A Sprain of lateral collateral ligament of right knee, initial encounter: Secondary | ICD-10-CM | POA: Diagnosis not present

## 2020-01-25 DIAGNOSIS — M9902 Segmental and somatic dysfunction of thoracic region: Secondary | ICD-10-CM | POA: Diagnosis not present

## 2020-01-25 DIAGNOSIS — M545 Low back pain: Secondary | ICD-10-CM | POA: Diagnosis not present

## 2020-01-25 DIAGNOSIS — E785 Hyperlipidemia, unspecified: Secondary | ICD-10-CM | POA: Diagnosis not present

## 2020-01-25 DIAGNOSIS — Z1211 Encounter for screening for malignant neoplasm of colon: Secondary | ICD-10-CM | POA: Diagnosis not present

## 2020-01-27 DIAGNOSIS — M9907 Segmental and somatic dysfunction of upper extremity: Secondary | ICD-10-CM | POA: Diagnosis not present

## 2020-01-27 DIAGNOSIS — M5442 Lumbago with sciatica, left side: Secondary | ICD-10-CM | POA: Diagnosis not present

## 2020-01-27 DIAGNOSIS — S83421A Sprain of lateral collateral ligament of right knee, initial encounter: Secondary | ICD-10-CM | POA: Diagnosis not present

## 2020-01-27 DIAGNOSIS — M9902 Segmental and somatic dysfunction of thoracic region: Secondary | ICD-10-CM | POA: Diagnosis not present

## 2020-01-28 ENCOUNTER — Other Ambulatory Visit: Payer: Self-pay | Admitting: Family Medicine

## 2020-01-28 DIAGNOSIS — Z1231 Encounter for screening mammogram for malignant neoplasm of breast: Secondary | ICD-10-CM

## 2020-01-28 DIAGNOSIS — M810 Age-related osteoporosis without current pathological fracture: Secondary | ICD-10-CM

## 2020-02-22 DIAGNOSIS — S83421A Sprain of lateral collateral ligament of right knee, initial encounter: Secondary | ICD-10-CM | POA: Diagnosis not present

## 2020-02-22 DIAGNOSIS — M9907 Segmental and somatic dysfunction of upper extremity: Secondary | ICD-10-CM | POA: Diagnosis not present

## 2020-02-22 DIAGNOSIS — M5442 Lumbago with sciatica, left side: Secondary | ICD-10-CM | POA: Diagnosis not present

## 2020-02-22 DIAGNOSIS — M9902 Segmental and somatic dysfunction of thoracic region: Secondary | ICD-10-CM | POA: Diagnosis not present

## 2020-02-23 DIAGNOSIS — M9902 Segmental and somatic dysfunction of thoracic region: Secondary | ICD-10-CM | POA: Diagnosis not present

## 2020-02-23 DIAGNOSIS — S83421A Sprain of lateral collateral ligament of right knee, initial encounter: Secondary | ICD-10-CM | POA: Diagnosis not present

## 2020-02-23 DIAGNOSIS — M5442 Lumbago with sciatica, left side: Secondary | ICD-10-CM | POA: Diagnosis not present

## 2020-02-23 DIAGNOSIS — M9907 Segmental and somatic dysfunction of upper extremity: Secondary | ICD-10-CM | POA: Diagnosis not present

## 2020-03-02 ENCOUNTER — Other Ambulatory Visit: Payer: Self-pay

## 2020-03-02 ENCOUNTER — Ambulatory Visit
Admission: RE | Admit: 2020-03-02 | Discharge: 2020-03-02 | Disposition: A | Discharge status: Home / Self Care | Payer: Medicare Other | Source: Ambulatory Visit | Attending: Family Medicine | Admitting: Family Medicine

## 2020-03-02 DIAGNOSIS — Z1231 Encounter for screening mammogram for malignant neoplasm of breast: Secondary | ICD-10-CM | POA: Diagnosis not present

## 2020-03-02 DIAGNOSIS — M810 Age-related osteoporosis without current pathological fracture: Secondary | ICD-10-CM

## 2020-03-02 DIAGNOSIS — Z780 Asymptomatic menopausal state: Secondary | ICD-10-CM | POA: Diagnosis not present

## 2020-03-02 DIAGNOSIS — M8589 Other specified disorders of bone density and structure, multiple sites: Secondary | ICD-10-CM | POA: Diagnosis not present

## 2020-05-13 DIAGNOSIS — Z23 Encounter for immunization: Secondary | ICD-10-CM | POA: Diagnosis not present

## 2020-05-24 DIAGNOSIS — Z23 Encounter for immunization: Secondary | ICD-10-CM | POA: Diagnosis not present

## 2020-12-29 DIAGNOSIS — M1711 Unilateral primary osteoarthritis, right knee: Secondary | ICD-10-CM | POA: Diagnosis not present

## 2020-12-29 DIAGNOSIS — M1712 Unilateral primary osteoarthritis, left knee: Secondary | ICD-10-CM | POA: Diagnosis not present

## 2021-01-17 ENCOUNTER — Other Ambulatory Visit: Payer: Self-pay | Admitting: Family Medicine

## 2021-01-17 DIAGNOSIS — Z1231 Encounter for screening mammogram for malignant neoplasm of breast: Secondary | ICD-10-CM

## 2021-02-02 DIAGNOSIS — E785 Hyperlipidemia, unspecified: Secondary | ICD-10-CM | POA: Diagnosis not present

## 2021-02-02 DIAGNOSIS — I10 Essential (primary) hypertension: Secondary | ICD-10-CM | POA: Diagnosis not present

## 2021-02-02 DIAGNOSIS — Z0000 Encounter for general adult medical examination without abnormal findings: Secondary | ICD-10-CM | POA: Diagnosis not present

## 2021-02-02 DIAGNOSIS — M810 Age-related osteoporosis without current pathological fracture: Secondary | ICD-10-CM | POA: Diagnosis not present

## 2021-02-08 DIAGNOSIS — M1712 Unilateral primary osteoarthritis, left knee: Secondary | ICD-10-CM | POA: Diagnosis not present

## 2021-02-08 DIAGNOSIS — M1711 Unilateral primary osteoarthritis, right knee: Secondary | ICD-10-CM | POA: Diagnosis not present

## 2021-02-15 DIAGNOSIS — M1712 Unilateral primary osteoarthritis, left knee: Secondary | ICD-10-CM | POA: Diagnosis not present

## 2021-02-15 DIAGNOSIS — M1711 Unilateral primary osteoarthritis, right knee: Secondary | ICD-10-CM | POA: Diagnosis not present

## 2021-02-22 DIAGNOSIS — M1711 Unilateral primary osteoarthritis, right knee: Secondary | ICD-10-CM | POA: Diagnosis not present

## 2021-02-22 DIAGNOSIS — M1712 Unilateral primary osteoarthritis, left knee: Secondary | ICD-10-CM | POA: Diagnosis not present

## 2021-03-14 ENCOUNTER — Ambulatory Visit
Admission: RE | Admit: 2021-03-14 | Discharge: 2021-03-14 | Disposition: A | Discharge status: Home / Self Care | Payer: Medicare HMO | Source: Ambulatory Visit | Attending: Family Medicine | Admitting: Family Medicine

## 2021-03-14 ENCOUNTER — Other Ambulatory Visit: Payer: Self-pay

## 2021-03-14 DIAGNOSIS — Z1231 Encounter for screening mammogram for malignant neoplasm of breast: Secondary | ICD-10-CM

## 2021-03-24 DIAGNOSIS — M545 Low back pain, unspecified: Secondary | ICD-10-CM | POA: Diagnosis not present

## 2021-05-29 DIAGNOSIS — Z6836 Body mass index (BMI) 36.0-36.9, adult: Secondary | ICD-10-CM | POA: Diagnosis not present

## 2021-05-29 DIAGNOSIS — M130 Polyarthritis, unspecified: Secondary | ICD-10-CM | POA: Diagnosis not present

## 2021-05-29 DIAGNOSIS — M5136 Other intervertebral disc degeneration, lumbar region: Secondary | ICD-10-CM | POA: Diagnosis not present

## 2021-05-29 DIAGNOSIS — Z79899 Other long term (current) drug therapy: Secondary | ICD-10-CM | POA: Diagnosis not present

## 2021-05-29 DIAGNOSIS — E669 Obesity, unspecified: Secondary | ICD-10-CM | POA: Diagnosis not present

## 2021-05-29 DIAGNOSIS — M150 Primary generalized (osteo)arthritis: Secondary | ICD-10-CM | POA: Diagnosis not present

## 2021-06-15 DIAGNOSIS — H524 Presbyopia: Secondary | ICD-10-CM | POA: Diagnosis not present

## 2021-06-20 DIAGNOSIS — Z0100 Encounter for examination of eyes and vision without abnormal findings: Secondary | ICD-10-CM | POA: Diagnosis not present

## 2021-06-23 DIAGNOSIS — R32 Unspecified urinary incontinence: Secondary | ICD-10-CM | POA: Diagnosis not present

## 2021-06-23 DIAGNOSIS — Z8249 Family history of ischemic heart disease and other diseases of the circulatory system: Secondary | ICD-10-CM | POA: Diagnosis not present

## 2021-06-23 DIAGNOSIS — Z87891 Personal history of nicotine dependence: Secondary | ICD-10-CM | POA: Diagnosis not present

## 2021-06-23 DIAGNOSIS — Z803 Family history of malignant neoplasm of breast: Secondary | ICD-10-CM | POA: Diagnosis not present

## 2021-06-23 DIAGNOSIS — Z882 Allergy status to sulfonamides status: Secondary | ICD-10-CM | POA: Diagnosis not present

## 2021-06-23 DIAGNOSIS — Z008 Encounter for other general examination: Secondary | ICD-10-CM | POA: Diagnosis not present

## 2021-06-23 DIAGNOSIS — M1990 Unspecified osteoarthritis, unspecified site: Secondary | ICD-10-CM | POA: Diagnosis not present

## 2021-06-23 DIAGNOSIS — G8929 Other chronic pain: Secondary | ICD-10-CM | POA: Diagnosis not present

## 2021-06-23 DIAGNOSIS — Z880 Allergy status to penicillin: Secondary | ICD-10-CM | POA: Diagnosis not present

## 2021-06-23 DIAGNOSIS — I10 Essential (primary) hypertension: Secondary | ICD-10-CM | POA: Diagnosis not present

## 2021-06-23 DIAGNOSIS — Z6836 Body mass index (BMI) 36.0-36.9, adult: Secondary | ICD-10-CM | POA: Diagnosis not present

## 2021-06-23 DIAGNOSIS — Z881 Allergy status to other antibiotic agents status: Secondary | ICD-10-CM | POA: Diagnosis not present

## 2021-06-23 DIAGNOSIS — Z791 Long term (current) use of non-steroidal anti-inflammatories (NSAID): Secondary | ICD-10-CM | POA: Diagnosis not present

## 2021-12-10 IMAGING — MG MM DIGITAL SCREENING BILAT W/ TOMO AND CAD
8 series · 8 of 24 positions shown · non-contrast
Comparison: Previous exam(s).

CLINICAL DATA: Screening.

EXAM:
DIGITAL SCREENING BILATERAL MAMMOGRAM WITH TOMOSYNTHESIS AND CAD
TECHNIQUE: Bilateral screening digital craniocaudal and mediolateral oblique
mammograms were obtained. Bilateral screening digital breast
tomosynthesis was performed. The images were evaluated with
computer-aided detection.

[L MLO synth-2D]
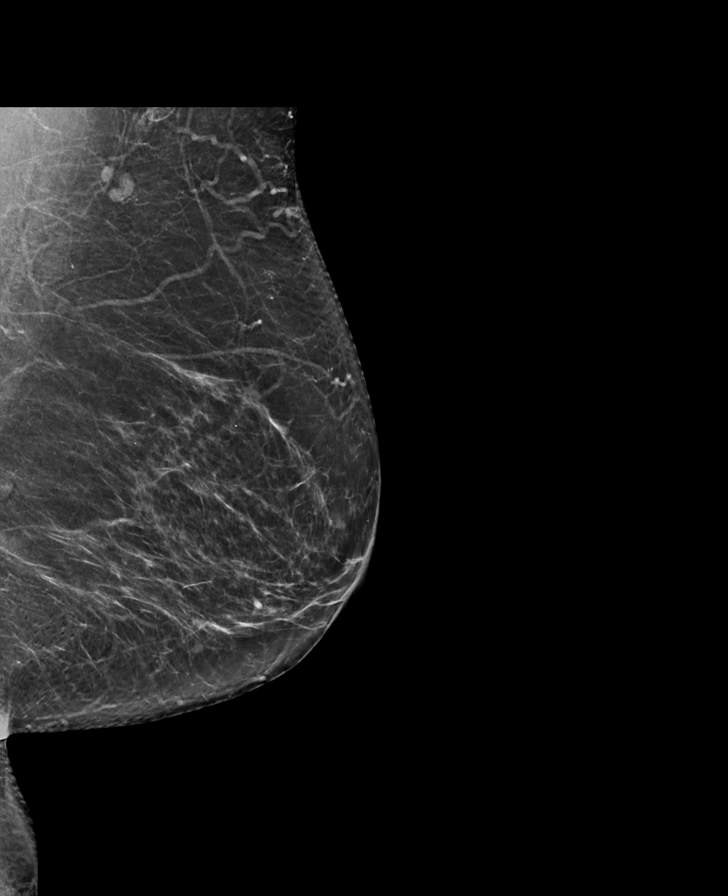

[R MLO synth-2D]
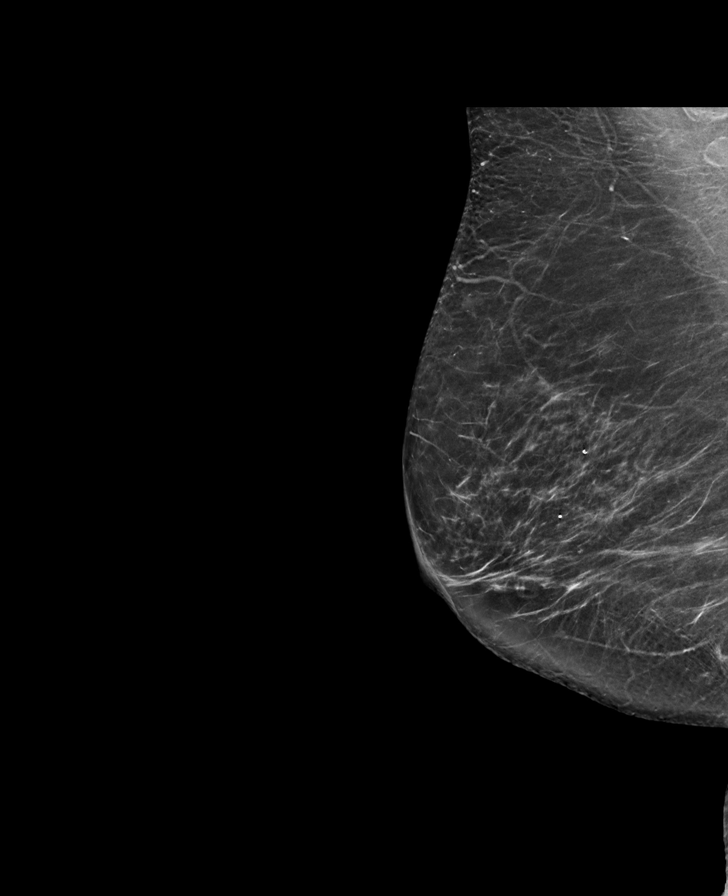

[R CC synth-2D]
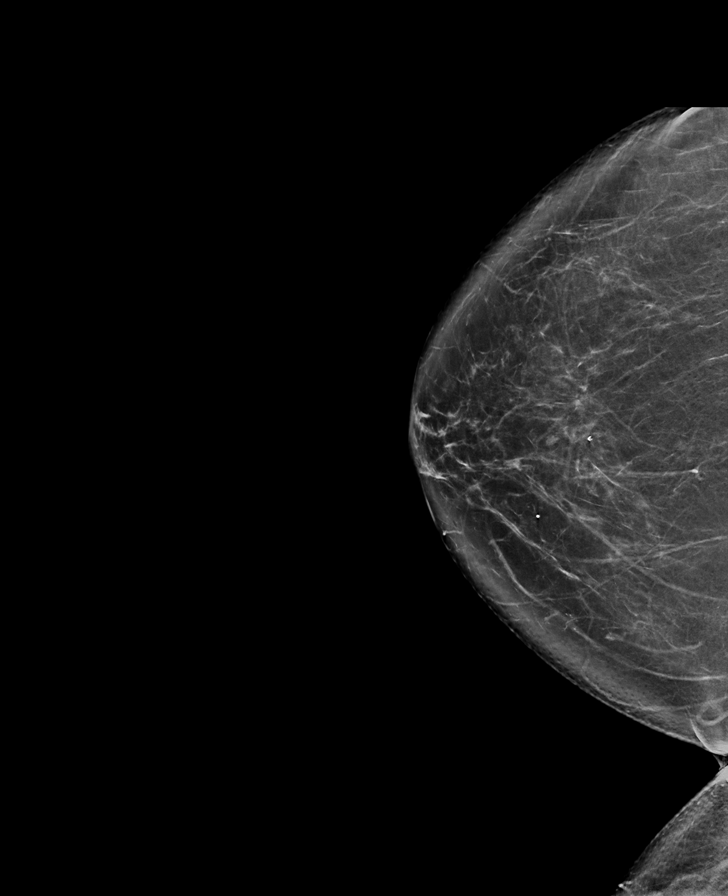

[L CC synth-2D]
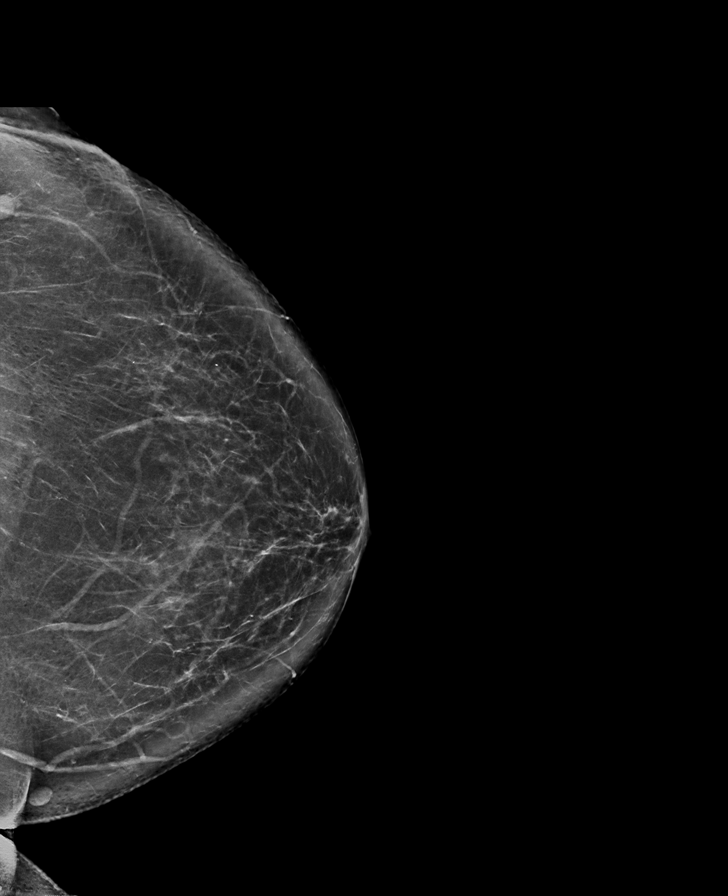

[R CC tomo · tomo slice 41/81.0]
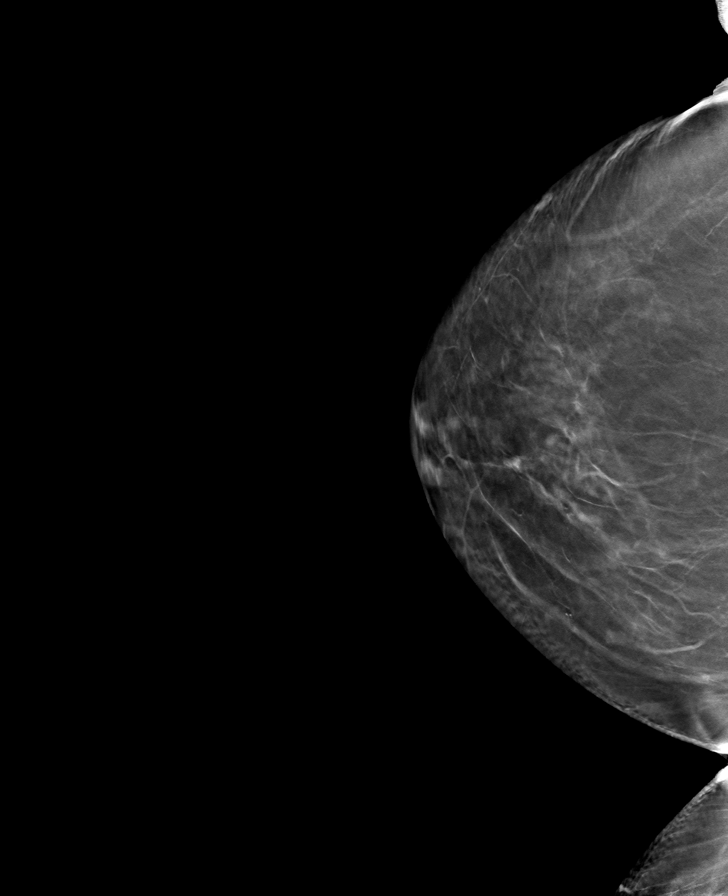

[R MLO tomo · tomo slice 37/74.0]
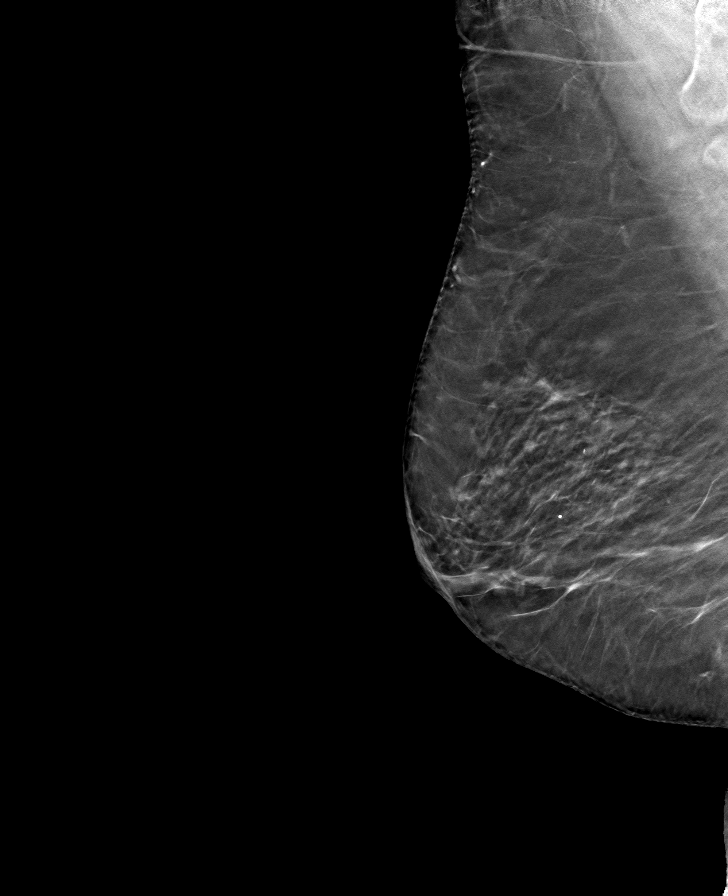

[L CC tomo · tomo slice 42/83.0]
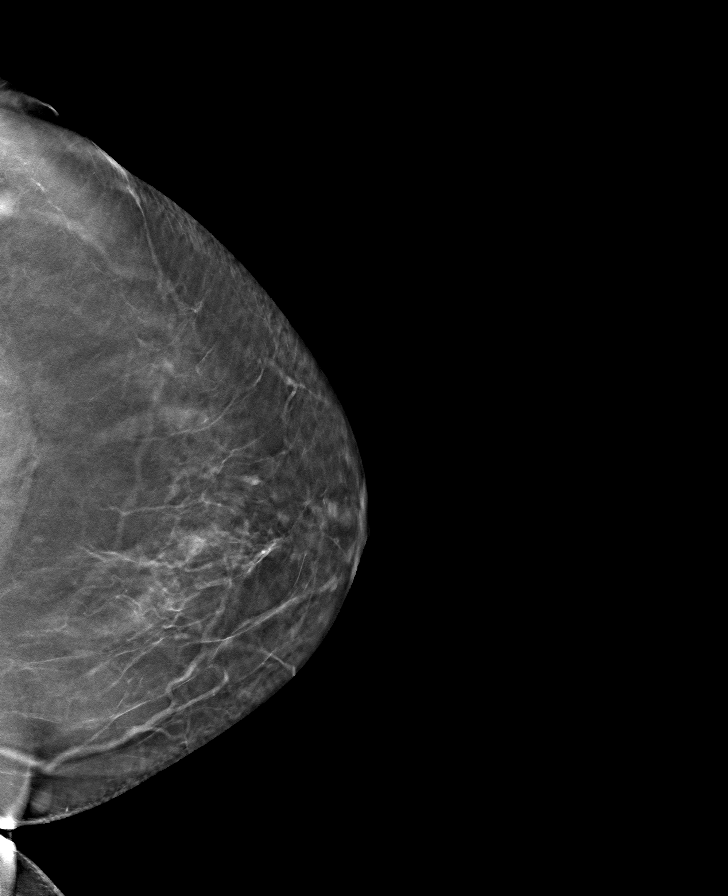

[L MLO tomo · tomo slice 37/73.0]
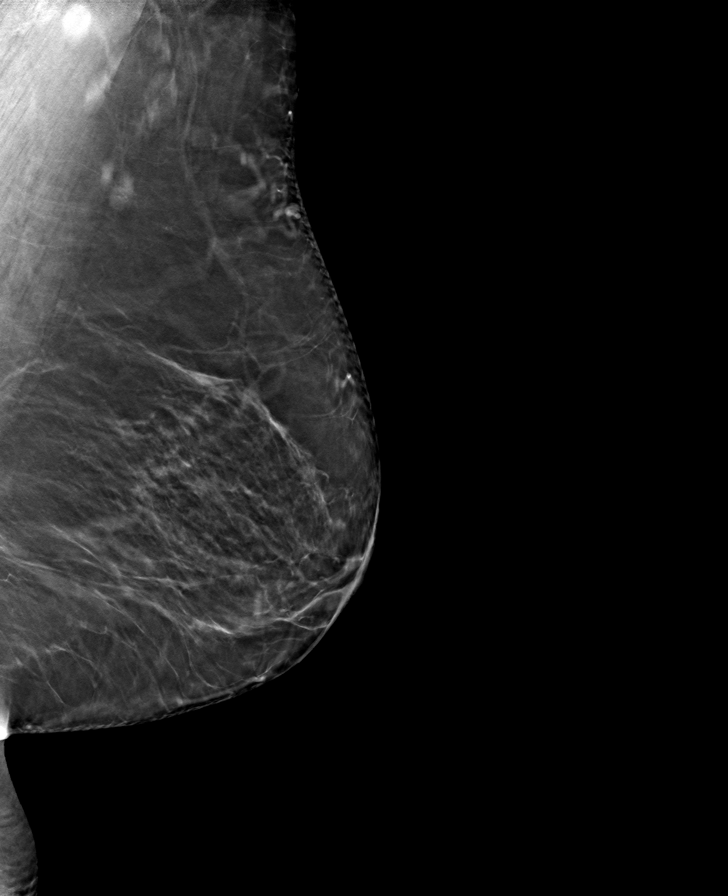

[8 of 24 positions shown; findings below may reference images not displayed]

ACR Breast Density Category b: There are scattered areas of
fibroglandular density.
FINDINGS: There are no findings suspicious for malignancy.
IMPRESSION: No mammographic evidence of malignancy. A result letter of this
screening mammogram will be mailed directly to the patient.

RECOMMENDATION:
Screening mammogram in one year. (Code:51-O-LD2)

BI-RADS CATEGORY  1: Negative.

## 2022-01-19 DIAGNOSIS — M170 Bilateral primary osteoarthritis of knee: Secondary | ICD-10-CM | POA: Diagnosis not present

## 2022-01-26 ENCOUNTER — Other Ambulatory Visit: Payer: Self-pay | Admitting: Family Medicine

## 2022-01-26 DIAGNOSIS — Z1231 Encounter for screening mammogram for malignant neoplasm of breast: Secondary | ICD-10-CM

## 2022-01-30 DIAGNOSIS — Z833 Family history of diabetes mellitus: Secondary | ICD-10-CM | POA: Diagnosis not present

## 2022-01-30 DIAGNOSIS — Z8249 Family history of ischemic heart disease and other diseases of the circulatory system: Secondary | ICD-10-CM | POA: Diagnosis not present

## 2022-01-30 DIAGNOSIS — Z791 Long term (current) use of non-steroidal anti-inflammatories (NSAID): Secondary | ICD-10-CM | POA: Diagnosis not present

## 2022-01-30 DIAGNOSIS — M1990 Unspecified osteoarthritis, unspecified site: Secondary | ICD-10-CM | POA: Diagnosis not present

## 2022-01-30 DIAGNOSIS — Z823 Family history of stroke: Secondary | ICD-10-CM | POA: Diagnosis not present

## 2022-01-30 DIAGNOSIS — I10 Essential (primary) hypertension: Secondary | ICD-10-CM | POA: Diagnosis not present

## 2022-01-30 DIAGNOSIS — Z809 Family history of malignant neoplasm, unspecified: Secondary | ICD-10-CM | POA: Diagnosis not present

## 2022-01-30 DIAGNOSIS — Z87891 Personal history of nicotine dependence: Secondary | ICD-10-CM | POA: Diagnosis not present

## 2022-01-30 DIAGNOSIS — R32 Unspecified urinary incontinence: Secondary | ICD-10-CM | POA: Diagnosis not present

## 2022-01-30 DIAGNOSIS — Z880 Allergy status to penicillin: Secondary | ICD-10-CM | POA: Diagnosis not present

## 2022-01-30 DIAGNOSIS — Z6836 Body mass index (BMI) 36.0-36.9, adult: Secondary | ICD-10-CM | POA: Diagnosis not present

## 2022-02-08 DIAGNOSIS — E785 Hyperlipidemia, unspecified: Secondary | ICD-10-CM | POA: Diagnosis not present

## 2022-02-08 DIAGNOSIS — I10 Essential (primary) hypertension: Secondary | ICD-10-CM | POA: Diagnosis not present

## 2022-02-08 DIAGNOSIS — Z6836 Body mass index (BMI) 36.0-36.9, adult: Secondary | ICD-10-CM | POA: Diagnosis not present

## 2022-02-08 DIAGNOSIS — Z0000 Encounter for general adult medical examination without abnormal findings: Secondary | ICD-10-CM | POA: Diagnosis not present

## 2022-02-08 DIAGNOSIS — M810 Age-related osteoporosis without current pathological fracture: Secondary | ICD-10-CM | POA: Diagnosis not present

## 2022-02-08 DIAGNOSIS — E559 Vitamin D deficiency, unspecified: Secondary | ICD-10-CM | POA: Diagnosis not present

## 2022-02-08 DIAGNOSIS — Z8589 Personal history of malignant neoplasm of other organs and systems: Secondary | ICD-10-CM | POA: Diagnosis not present

## 2022-02-09 ENCOUNTER — Other Ambulatory Visit: Payer: Self-pay | Admitting: Family Medicine

## 2022-02-09 DIAGNOSIS — M810 Age-related osteoporosis without current pathological fracture: Secondary | ICD-10-CM

## 2022-03-08 ENCOUNTER — Ambulatory Visit
Admission: RE | Admit: 2022-03-08 | Discharge: 2022-03-08 | Disposition: A | Discharge status: Home / Self Care | Payer: Medicare HMO | Source: Ambulatory Visit | Attending: Family Medicine | Admitting: Family Medicine

## 2022-03-08 DIAGNOSIS — M810 Age-related osteoporosis without current pathological fracture: Secondary | ICD-10-CM

## 2022-03-08 DIAGNOSIS — Z780 Asymptomatic menopausal state: Secondary | ICD-10-CM | POA: Diagnosis not present

## 2022-03-08 DIAGNOSIS — M8589 Other specified disorders of bone density and structure, multiple sites: Secondary | ICD-10-CM | POA: Diagnosis not present

## 2022-03-15 ENCOUNTER — Ambulatory Visit: Payer: Medicare HMO

## 2022-03-19 ENCOUNTER — Ambulatory Visit
Admission: RE | Admit: 2022-03-19 | Discharge: 2022-03-19 | Disposition: A | Discharge status: Home / Self Care | Payer: Medicare HMO | Source: Ambulatory Visit | Attending: Family Medicine | Admitting: Family Medicine

## 2022-03-19 DIAGNOSIS — Z1231 Encounter for screening mammogram for malignant neoplasm of breast: Secondary | ICD-10-CM | POA: Diagnosis not present

## 2022-03-22 ENCOUNTER — Other Ambulatory Visit: Payer: Self-pay | Admitting: Family Medicine

## 2022-03-22 DIAGNOSIS — R928 Other abnormal and inconclusive findings on diagnostic imaging of breast: Secondary | ICD-10-CM

## 2022-03-28 ENCOUNTER — Other Ambulatory Visit: Payer: Medicare HMO

## 2022-03-30 ENCOUNTER — Ambulatory Visit: Payer: Medicare HMO

## 2022-04-04 ENCOUNTER — Other Ambulatory Visit: Payer: Self-pay | Admitting: Family Medicine

## 2022-04-04 ENCOUNTER — Ambulatory Visit
Admission: RE | Admit: 2022-04-04 | Discharge: 2022-04-04 | Disposition: A | Discharge status: Home / Self Care | Payer: Medicare HMO | Source: Ambulatory Visit | Attending: Family Medicine | Admitting: Family Medicine

## 2022-04-04 DIAGNOSIS — R928 Other abnormal and inconclusive findings on diagnostic imaging of breast: Secondary | ICD-10-CM

## 2022-04-04 DIAGNOSIS — N6310 Unspecified lump in the right breast, unspecified quadrant: Secondary | ICD-10-CM

## 2022-04-04 DIAGNOSIS — N6315 Unspecified lump in the right breast, overlapping quadrants: Secondary | ICD-10-CM | POA: Diagnosis not present

## 2022-04-05 ENCOUNTER — Ambulatory Visit
Admission: RE | Admit: 2022-04-05 | Discharge: 2022-04-05 | Disposition: A | Discharge status: Home / Self Care | Payer: Medicare HMO | Source: Ambulatory Visit | Attending: Family Medicine | Admitting: Family Medicine

## 2022-04-05 DIAGNOSIS — N6310 Unspecified lump in the right breast, unspecified quadrant: Secondary | ICD-10-CM

## 2022-04-05 DIAGNOSIS — D241 Benign neoplasm of right breast: Secondary | ICD-10-CM | POA: Diagnosis not present

## 2022-04-05 DIAGNOSIS — N6311 Unspecified lump in the right breast, upper outer quadrant: Secondary | ICD-10-CM | POA: Diagnosis not present

## 2022-04-05 DIAGNOSIS — N6011 Diffuse cystic mastopathy of right breast: Secondary | ICD-10-CM | POA: Diagnosis not present

## 2022-05-05 DIAGNOSIS — N1831 Chronic kidney disease, stage 3a: Secondary | ICD-10-CM | POA: Diagnosis not present

## 2022-05-05 DIAGNOSIS — U071 COVID-19: Secondary | ICD-10-CM | POA: Diagnosis not present

## 2022-05-22 DIAGNOSIS — E782 Mixed hyperlipidemia: Secondary | ICD-10-CM | POA: Diagnosis not present

## 2022-09-21 DIAGNOSIS — M25561 Pain in right knee: Secondary | ICD-10-CM | POA: Diagnosis not present

## 2022-09-21 DIAGNOSIS — M25562 Pain in left knee: Secondary | ICD-10-CM | POA: Diagnosis not present

## 2022-09-21 DIAGNOSIS — M170 Bilateral primary osteoarthritis of knee: Secondary | ICD-10-CM | POA: Diagnosis not present

## 2022-09-24 DIAGNOSIS — M1990 Unspecified osteoarthritis, unspecified site: Secondary | ICD-10-CM | POA: Diagnosis not present

## 2022-09-24 DIAGNOSIS — Z8249 Family history of ischemic heart disease and other diseases of the circulatory system: Secondary | ICD-10-CM | POA: Diagnosis not present

## 2022-09-24 DIAGNOSIS — N189 Chronic kidney disease, unspecified: Secondary | ICD-10-CM | POA: Diagnosis not present

## 2022-09-24 DIAGNOSIS — E785 Hyperlipidemia, unspecified: Secondary | ICD-10-CM | POA: Diagnosis not present

## 2022-09-24 DIAGNOSIS — I129 Hypertensive chronic kidney disease with stage 1 through stage 4 chronic kidney disease, or unspecified chronic kidney disease: Secondary | ICD-10-CM | POA: Diagnosis not present

## 2022-09-24 DIAGNOSIS — Z87891 Personal history of nicotine dependence: Secondary | ICD-10-CM | POA: Diagnosis not present

## 2022-09-24 DIAGNOSIS — Z008 Encounter for other general examination: Secondary | ICD-10-CM | POA: Diagnosis not present

## 2022-09-24 DIAGNOSIS — Z823 Family history of stroke: Secondary | ICD-10-CM | POA: Diagnosis not present

## 2022-09-24 DIAGNOSIS — Z9181 History of falling: Secondary | ICD-10-CM | POA: Diagnosis not present

## 2022-09-24 DIAGNOSIS — R269 Unspecified abnormalities of gait and mobility: Secondary | ICD-10-CM | POA: Diagnosis not present

## 2022-09-24 DIAGNOSIS — R32 Unspecified urinary incontinence: Secondary | ICD-10-CM | POA: Diagnosis not present

## 2022-09-24 DIAGNOSIS — M8580 Other specified disorders of bone density and structure, unspecified site: Secondary | ICD-10-CM | POA: Diagnosis not present

## 2023-02-19 DIAGNOSIS — Z87891 Personal history of nicotine dependence: Secondary | ICD-10-CM | POA: Diagnosis not present

## 2023-02-19 DIAGNOSIS — E559 Vitamin D deficiency, unspecified: Secondary | ICD-10-CM | POA: Diagnosis not present

## 2023-02-19 DIAGNOSIS — M159 Polyosteoarthritis, unspecified: Secondary | ICD-10-CM | POA: Diagnosis not present

## 2023-02-19 DIAGNOSIS — E782 Mixed hyperlipidemia: Secondary | ICD-10-CM | POA: Diagnosis not present

## 2023-02-19 DIAGNOSIS — N1831 Chronic kidney disease, stage 3a: Secondary | ICD-10-CM | POA: Diagnosis not present

## 2023-02-19 DIAGNOSIS — I10 Essential (primary) hypertension: Secondary | ICD-10-CM | POA: Diagnosis not present

## 2023-02-19 DIAGNOSIS — E669 Obesity, unspecified: Secondary | ICD-10-CM | POA: Diagnosis not present

## 2023-02-19 DIAGNOSIS — Z6834 Body mass index (BMI) 34.0-34.9, adult: Secondary | ICD-10-CM | POA: Diagnosis not present

## 2023-02-19 DIAGNOSIS — Z0000 Encounter for general adult medical examination without abnormal findings: Secondary | ICD-10-CM | POA: Diagnosis not present

## 2023-04-12 ENCOUNTER — Emergency Department (HOSPITAL_COMMUNITY): Payer: Medicare HMO

## 2023-04-12 ENCOUNTER — Inpatient Hospital Stay (HOSPITAL_COMMUNITY): Payer: Medicare HMO

## 2023-04-12 ENCOUNTER — Inpatient Hospital Stay (HOSPITAL_COMMUNITY)
Admission: EM | Admit: 2023-04-12 | Discharge: 2023-06-14 | DRG: 003 | Disposition: E | Payer: Medicare HMO | Attending: Internal Medicine | Admitting: Internal Medicine

## 2023-04-12 ENCOUNTER — Inpatient Hospital Stay (HOSPITAL_COMMUNITY): Payer: Medicare HMO | Admitting: Certified Registered"

## 2023-04-12 ENCOUNTER — Encounter (HOSPITAL_COMMUNITY): Admission: EM | Disposition: E | Payer: Self-pay | Source: Home / Self Care | Attending: Neurosurgery

## 2023-04-12 DIAGNOSIS — J90 Pleural effusion, not elsewhere classified: Secondary | ICD-10-CM | POA: Diagnosis not present

## 2023-04-12 DIAGNOSIS — R0682 Tachypnea, not elsewhere classified: Secondary | ICD-10-CM | POA: Diagnosis not present

## 2023-04-12 DIAGNOSIS — R9389 Abnormal findings on diagnostic imaging of other specified body structures: Secondary | ICD-10-CM | POA: Diagnosis not present

## 2023-04-12 DIAGNOSIS — Z882 Allergy status to sulfonamides status: Secondary | ICD-10-CM | POA: Diagnosis not present

## 2023-04-12 DIAGNOSIS — Z452 Encounter for adjustment and management of vascular access device: Secondary | ICD-10-CM | POA: Diagnosis not present

## 2023-04-12 DIAGNOSIS — I10 Essential (primary) hypertension: Secondary | ICD-10-CM

## 2023-04-12 DIAGNOSIS — A419 Sepsis, unspecified organism: Secondary | ICD-10-CM | POA: Diagnosis not present

## 2023-04-12 DIAGNOSIS — I609 Nontraumatic subarachnoid hemorrhage, unspecified: Secondary | ICD-10-CM

## 2023-04-12 DIAGNOSIS — Z79899 Other long term (current) drug therapy: Secondary | ICD-10-CM

## 2023-04-12 DIAGNOSIS — I602 Nontraumatic subarachnoid hemorrhage from anterior communicating artery: Secondary | ICD-10-CM | POA: Diagnosis not present

## 2023-04-12 DIAGNOSIS — R0902 Hypoxemia: Secondary | ICD-10-CM | POA: Diagnosis not present

## 2023-04-12 DIAGNOSIS — Z87891 Personal history of nicotine dependence: Secondary | ICD-10-CM

## 2023-04-12 DIAGNOSIS — R918 Other nonspecific abnormal finding of lung field: Secondary | ICD-10-CM | POA: Diagnosis not present

## 2023-04-12 DIAGNOSIS — I619 Nontraumatic intracerebral hemorrhage, unspecified: Secondary | ICD-10-CM | POA: Diagnosis not present

## 2023-04-12 DIAGNOSIS — Z66 Do not resuscitate: Secondary | ICD-10-CM | POA: Diagnosis not present

## 2023-04-12 DIAGNOSIS — I5030 Unspecified diastolic (congestive) heart failure: Secondary | ICD-10-CM | POA: Diagnosis not present

## 2023-04-12 DIAGNOSIS — J9601 Acute respiratory failure with hypoxia: Secondary | ICD-10-CM | POA: Diagnosis not present

## 2023-04-12 DIAGNOSIS — E669 Obesity, unspecified: Secondary | ICD-10-CM | POA: Diagnosis not present

## 2023-04-12 DIAGNOSIS — G910 Communicating hydrocephalus: Secondary | ICD-10-CM | POA: Diagnosis not present

## 2023-04-12 DIAGNOSIS — Z930 Tracheostomy status: Secondary | ICD-10-CM | POA: Diagnosis not present

## 2023-04-12 DIAGNOSIS — I671 Cerebral aneurysm, nonruptured: Secondary | ICD-10-CM | POA: Diagnosis not present

## 2023-04-12 DIAGNOSIS — Z880 Allergy status to penicillin: Secondary | ICD-10-CM

## 2023-04-12 DIAGNOSIS — Z8673 Personal history of transient ischemic attack (TIA), and cerebral infarction without residual deficits: Secondary | ICD-10-CM | POA: Diagnosis not present

## 2023-04-12 DIAGNOSIS — R4589 Other symptoms and signs involving emotional state: Secondary | ICD-10-CM | POA: Diagnosis not present

## 2023-04-12 DIAGNOSIS — Z883 Allergy status to other anti-infective agents status: Secondary | ICD-10-CM

## 2023-04-12 DIAGNOSIS — R569 Unspecified convulsions: Secondary | ICD-10-CM | POA: Diagnosis not present

## 2023-04-12 DIAGNOSIS — Z781 Physical restraint status: Secondary | ICD-10-CM | POA: Diagnosis not present

## 2023-04-12 DIAGNOSIS — D6489 Other specified anemias: Secondary | ICD-10-CM | POA: Diagnosis not present

## 2023-04-12 DIAGNOSIS — R4182 Altered mental status, unspecified: Secondary | ICD-10-CM | POA: Diagnosis not present

## 2023-04-12 DIAGNOSIS — Z7189 Other specified counseling: Secondary | ICD-10-CM | POA: Diagnosis not present

## 2023-04-12 DIAGNOSIS — E222 Syndrome of inappropriate secretion of antidiuretic hormone: Secondary | ICD-10-CM | POA: Diagnosis not present

## 2023-04-12 DIAGNOSIS — E876 Hypokalemia: Secondary | ICD-10-CM | POA: Diagnosis present

## 2023-04-12 DIAGNOSIS — M4312 Spondylolisthesis, cervical region: Secondary | ICD-10-CM | POA: Diagnosis not present

## 2023-04-12 DIAGNOSIS — R0602 Shortness of breath: Secondary | ICD-10-CM | POA: Diagnosis not present

## 2023-04-12 DIAGNOSIS — J690 Pneumonitis due to inhalation of food and vomit: Secondary | ICD-10-CM | POA: Diagnosis present

## 2023-04-12 DIAGNOSIS — I629 Nontraumatic intracranial hemorrhage, unspecified: Secondary | ICD-10-CM | POA: Diagnosis not present

## 2023-04-12 DIAGNOSIS — Z515 Encounter for palliative care: Secondary | ICD-10-CM | POA: Diagnosis not present

## 2023-04-12 DIAGNOSIS — M47812 Spondylosis without myelopathy or radiculopathy, cervical region: Secondary | ICD-10-CM | POA: Diagnosis not present

## 2023-04-12 DIAGNOSIS — R339 Retention of urine, unspecified: Secondary | ICD-10-CM | POA: Diagnosis present

## 2023-04-12 DIAGNOSIS — R55 Syncope and collapse: Secondary | ICD-10-CM | POA: Diagnosis not present

## 2023-04-12 DIAGNOSIS — J9602 Acute respiratory failure with hypercapnia: Secondary | ICD-10-CM | POA: Diagnosis present

## 2023-04-12 DIAGNOSIS — I6389 Other cerebral infarction: Secondary | ICD-10-CM | POA: Diagnosis not present

## 2023-04-12 DIAGNOSIS — R404 Transient alteration of awareness: Secondary | ICD-10-CM | POA: Diagnosis not present

## 2023-04-12 DIAGNOSIS — G936 Cerebral edema: Secondary | ICD-10-CM | POA: Diagnosis not present

## 2023-04-12 DIAGNOSIS — J9600 Acute respiratory failure, unspecified whether with hypoxia or hypercapnia: Secondary | ICD-10-CM | POA: Diagnosis not present

## 2023-04-12 DIAGNOSIS — G911 Obstructive hydrocephalus: Secondary | ICD-10-CM | POA: Diagnosis not present

## 2023-04-12 DIAGNOSIS — M5020 Other cervical disc displacement, unspecified cervical region: Secondary | ICD-10-CM | POA: Diagnosis not present

## 2023-04-12 DIAGNOSIS — R29818 Other symptoms and signs involving the nervous system: Secondary | ICD-10-CM | POA: Diagnosis not present

## 2023-04-12 DIAGNOSIS — E871 Hypo-osmolality and hyponatremia: Secondary | ICD-10-CM | POA: Diagnosis not present

## 2023-04-12 DIAGNOSIS — R1310 Dysphagia, unspecified: Secondary | ICD-10-CM | POA: Diagnosis not present

## 2023-04-12 DIAGNOSIS — Z803 Family history of malignant neoplasm of breast: Secondary | ICD-10-CM

## 2023-04-12 DIAGNOSIS — I608 Other nontraumatic subarachnoid hemorrhage: Secondary | ICD-10-CM

## 2023-04-12 DIAGNOSIS — I517 Cardiomegaly: Secondary | ICD-10-CM | POA: Diagnosis not present

## 2023-04-12 DIAGNOSIS — Z6834 Body mass index (BMI) 34.0-34.9, adult: Secondary | ICD-10-CM

## 2023-04-12 DIAGNOSIS — I615 Nontraumatic intracerebral hemorrhage, intraventricular: Secondary | ICD-10-CM | POA: Diagnosis not present

## 2023-04-12 DIAGNOSIS — Z4682 Encounter for fitting and adjustment of non-vascular catheter: Secondary | ICD-10-CM | POA: Diagnosis not present

## 2023-04-12 DIAGNOSIS — I771 Stricture of artery: Secondary | ICD-10-CM | POA: Diagnosis not present

## 2023-04-12 DIAGNOSIS — I6200 Nontraumatic subdural hemorrhage, unspecified: Secondary | ICD-10-CM | POA: Diagnosis not present

## 2023-04-12 DIAGNOSIS — Z881 Allergy status to other antibiotic agents status: Secondary | ICD-10-CM

## 2023-04-12 DIAGNOSIS — I082 Rheumatic disorders of both aortic and tricuspid valves: Secondary | ICD-10-CM | POA: Diagnosis not present

## 2023-04-12 DIAGNOSIS — Z743 Need for continuous supervision: Secondary | ICD-10-CM | POA: Diagnosis not present

## 2023-04-12 DIAGNOSIS — R140 Abdominal distension (gaseous): Secondary | ICD-10-CM | POA: Diagnosis not present

## 2023-04-12 DIAGNOSIS — M50321 Other cervical disc degeneration at C4-C5 level: Secondary | ICD-10-CM | POA: Diagnosis not present

## 2023-04-12 DIAGNOSIS — G9340 Encephalopathy, unspecified: Secondary | ICD-10-CM | POA: Diagnosis not present

## 2023-04-12 DIAGNOSIS — G919 Hydrocephalus, unspecified: Secondary | ICD-10-CM | POA: Diagnosis not present

## 2023-04-12 DIAGNOSIS — J9811 Atelectasis: Secondary | ICD-10-CM | POA: Diagnosis not present

## 2023-04-12 DIAGNOSIS — I700 Atherosclerosis of aorta: Secondary | ICD-10-CM | POA: Diagnosis not present

## 2023-04-12 HISTORY — PX: IR TRANSCATH/EMBOLIZ: IMG695

## 2023-04-12 HISTORY — PX: IR ANGIO VERTEBRAL SEL VERTEBRAL UNI L MOD SED: IMG5367

## 2023-04-12 HISTORY — PX: RADIOLOGY WITH ANESTHESIA: SHX6223

## 2023-04-12 HISTORY — PX: IR 3D INDEPENDENT WKST: IMG2385

## 2023-04-12 HISTORY — PX: IR ANGIOGRAM FOLLOW UP STUDY: IMG697

## 2023-04-12 LAB — RAPID URINE DRUG SCREEN, HOSP PERFORMED
Amphetamines: NOT DETECTED
Barbiturates: NOT DETECTED
Benzodiazepines: NOT DETECTED
Cocaine: NOT DETECTED
Opiates: NOT DETECTED
Tetrahydrocannabinol: NOT DETECTED

## 2023-04-12 LAB — I-STAT ARTERIAL BLOOD GAS, ED
Acid-base deficit: 6 mmol/L — ABNORMAL HIGH (ref 0.0–2.0)
Acid-base deficit: 5 mmol/L — ABNORMAL HIGH (ref 0.0–2.0)
Bicarbonate: 24.3 mmol/L (ref 20.0–28.0)
Bicarbonate: 22.8 mmol/L (ref 20.0–28.0)
Calcium, Ion: 1.26 mmol/L (ref 1.15–1.40)
Calcium, Ion: 1.22 mmol/L (ref 1.15–1.40)
HCT: 45 % (ref 36.0–46.0)
HCT: 42 % (ref 36.0–46.0)
Hemoglobin: 15.3 g/dL — ABNORMAL HIGH (ref 12.0–15.0)
Hemoglobin: 14.3 g/dL (ref 12.0–15.0)
O2 Saturation: 66 %
O2 Saturation: 100 %
Potassium: 3.6 mmol/L (ref 3.5–5.1)
Potassium: 3.9 mmol/L (ref 3.5–5.1)
Sodium: 139 mmol/L (ref 135–145)
Sodium: 139 mmol/L (ref 135–145)
TCO2: 26 mmol/L (ref 22–32)
TCO2: 24 mmol/L (ref 22–32)
pCO2 arterial: 67.2 mmHg (ref 32–48)
pCO2 arterial: 52.7 mmHg — ABNORMAL HIGH (ref 32–48)
pH, Arterial: 7.166 — CL (ref 7.35–7.45)
pH, Arterial: 7.245 — ABNORMAL LOW (ref 7.35–7.45)
pO2, Arterial: 44 mmHg — ABNORMAL LOW (ref 83–108)
pO2, Arterial: 354 mmHg — ABNORMAL HIGH (ref 83–108)

## 2023-04-12 LAB — CBC
HCT: 42.8 % (ref 36.0–46.0)
Hemoglobin: 13.4 g/dL (ref 12.0–15.0)
MCH: 28.9 pg (ref 26.0–34.0)
MCHC: 31.3 g/dL (ref 30.0–36.0)
MCV: 92.4 fL (ref 80.0–100.0)
Platelets: 240 10*3/uL (ref 150–400)
RBC: 4.63 MIL/uL (ref 3.87–5.11)
RDW: 13.7 % (ref 11.5–15.5)
WBC: 11.5 10*3/uL — ABNORMAL HIGH (ref 4.0–10.5)
nRBC: 0 % (ref 0.0–0.2)

## 2023-04-12 LAB — POCT I-STAT 7, (LYTES, BLD GAS, ICA,H+H)
Acid-base deficit: 7 mmol/L — ABNORMAL HIGH (ref 0.0–2.0)
Bicarbonate: 15.8 mmol/L — ABNORMAL LOW (ref 20.0–28.0)
Calcium, Ion: 1.09 mmol/L — ABNORMAL LOW (ref 1.15–1.40)
HCT: 35 % — ABNORMAL LOW (ref 36.0–46.0)
Hemoglobin: 11.9 g/dL — ABNORMAL LOW (ref 12.0–15.0)
O2 Saturation: 99 %
Potassium: 3.4 mmol/L — ABNORMAL LOW (ref 3.5–5.1)
Sodium: 142 mmol/L (ref 135–145)
TCO2: 16 mmol/L — ABNORMAL LOW (ref 22–32)
pCO2 arterial: 22.9 mmHg — ABNORMAL LOW (ref 32–48)
pH, Arterial: 7.446 (ref 7.35–7.45)
pO2, Arterial: 117 mmHg — ABNORMAL HIGH (ref 83–108)

## 2023-04-12 LAB — I-STAT CHEM 8, ED
BUN: 14 mg/dL (ref 8–23)
Calcium, Ion: 1.24 mmol/L (ref 1.15–1.40)
Chloride: 105 mmol/L (ref 98–111)
Creatinine, Ser: 0.8 mg/dL (ref 0.44–1.00)
Glucose, Bld: 191 mg/dL — ABNORMAL HIGH (ref 70–99)
HCT: 42 % (ref 36.0–46.0)
Hemoglobin: 14.3 g/dL (ref 12.0–15.0)
Potassium: 3.7 mmol/L (ref 3.5–5.1)
Sodium: 139 mmol/L (ref 135–145)
TCO2: 23 mmol/L (ref 22–32)

## 2023-04-12 LAB — COMPREHENSIVE METABOLIC PANEL WITH GFR
ALT: 18 U/L (ref 0–44)
AST: 21 U/L (ref 15–41)
Albumin: 4 g/dL (ref 3.5–5.0)
Alkaline Phosphatase: 62 U/L (ref 38–126)
Anion gap: 12 (ref 5–15)
BUN: 13 mg/dL (ref 8–23)
CO2: 20 mmol/L — ABNORMAL LOW (ref 22–32)
Calcium: 9 mg/dL (ref 8.9–10.3)
Chloride: 104 mmol/L (ref 98–111)
Creatinine, Ser: 0.88 mg/dL (ref 0.44–1.00)
GFR, Estimated: >60 mL/min
Glucose, Bld: 196 mg/dL — ABNORMAL HIGH (ref 70–99)
Potassium: 3.6 mmol/L (ref 3.5–5.1)
Sodium: 136 mmol/L (ref 135–145)
Total Bilirubin: 0.8 mg/dL (ref 0.3–1.2)
Total Protein: 6.5 g/dL (ref 6.5–8.1)

## 2023-04-12 LAB — TRIGLYCERIDES: Triglycerides: 134 mg/dL (ref <150)

## 2023-04-12 LAB — URINALYSIS, ROUTINE W REFLEX MICROSCOPIC
Bilirubin Urine: NEGATIVE
Glucose, UA: 100 mg/dL — AB
Ketones, ur: NEGATIVE mg/dL
Leukocytes,Ua: NEGATIVE
Nitrite: NEGATIVE
Protein, ur: 30 mg/dL — AB
Specific Gravity, Urine: 1.02 (ref 1.005–1.030)
pH: 6.5 (ref 5.0–8.0)

## 2023-04-12 LAB — TYPE AND SCREEN
ABO/RH(D): A POS
Antibody Screen: NEGATIVE

## 2023-04-12 LAB — URINALYSIS, MICROSCOPIC (REFLEX): Bacteria, UA: NONE SEEN

## 2023-04-12 LAB — DIFFERENTIAL
Abs Immature Granulocytes: 0.07 K/uL (ref 0.00–0.07)
Basophils Absolute: 0.1 K/uL (ref 0.0–0.1)
Basophils Relative: 0 %
Eosinophils Absolute: 0.1 K/uL (ref 0.0–0.5)
Eosinophils Relative: 1 %
Immature Granulocytes: 1 %
Lymphocytes Relative: 34 %
Lymphs Abs: 3.9 K/uL (ref 0.7–4.0)
Monocytes Absolute: 0.9 K/uL (ref 0.1–1.0)
Monocytes Relative: 8 %
Neutro Abs: 6.5 K/uL (ref 1.7–7.7)
Neutrophils Relative %: 56 %

## 2023-04-12 LAB — CBG MONITORING, ED: Glucose-Capillary: 176 mg/dL — ABNORMAL HIGH (ref 70–99)

## 2023-04-12 LAB — ABO/RH: ABO/RH(D): A POS

## 2023-04-12 LAB — APTT: aPTT: 24 seconds (ref 24–36)

## 2023-04-12 LAB — PROTIME-INR
INR: 1 (ref 0.8–1.2)
Prothrombin Time: 13.4 seconds (ref 11.4–15.2)

## 2023-04-12 LAB — MRSA NEXT GEN BY PCR, NASAL: MRSA by PCR Next Gen: NOT DETECTED

## 2023-04-12 LAB — ETHANOL: Alcohol, Ethyl (B): <10 mg/dL (ref <10)

## 2023-04-12 SURGERY — IR WITH ANESTHESIA
Anesthesia: General

## 2023-04-12 MED ORDER — LIDOCAINE-EPINEPHRINE (PF) 2 %-1:200000 IJ SOLN
INTRAMUSCULAR | Status: AC
  Filled 2023-04-12: qty 20

## 2023-04-12 MED ORDER — PANTOPRAZOLE SODIUM 40 MG PO TBEC
40.0000 mg | DELAYED_RELEASE_TABLET | Freq: Every day | ORAL | Status: DC
  Administered 2023-04-27 – 2023-04-29 (×2): 40 mg via ORAL
  Filled 2023-04-12 (×4): qty 1

## 2023-04-12 MED ORDER — FENTANYL CITRATE PF 50 MCG/ML IJ SOSY
PREFILLED_SYRINGE | INTRAMUSCULAR | Status: AC
  Filled 2023-04-12: qty 1

## 2023-04-12 MED ORDER — SODIUM CHLORIDE 0.9 % IV SOLN: INTRAVENOUS | Status: DC

## 2023-04-12 MED ORDER — SUCCINYLCHOLINE CHLORIDE 20 MG/ML IJ SOLN
INTRAMUSCULAR | Status: AC | PRN
Start: 2023-04-12 — End: 2023-04-12
  Administered 2023-04-12: 120 mg via INTRAVENOUS

## 2023-04-12 MED ORDER — ACETAMINOPHEN 160 MG/5ML PO SOLN
650.0000 mg | ORAL | Status: DC | PRN
  Administered 2023-04-16 – 2023-04-26 (×15): 650 mg
  Filled 2023-04-12 (×20): qty 20.3

## 2023-04-12 MED ORDER — ORAL CARE MOUTH RINSE
15.0000 mL | OROMUCOSAL | Status: DC
  Administered 2023-04-12 – 2023-04-15 (×36): 15 mL via OROMUCOSAL

## 2023-04-12 MED ORDER — CHLORHEXIDINE GLUCONATE CLOTH 2 % EX PADS
6.0000 | MEDICATED_PAD | Freq: Every day | CUTANEOUS | Status: DC
  Administered 2023-04-12 – 2023-04-22 (×13): 6 via TOPICAL

## 2023-04-12 MED ORDER — SODIUM CHLORIDE 0.9 % IV SOLN
INTRAVENOUS | Status: AC | PRN
  Administered 2023-04-12: 1000 mL via INTRAVENOUS

## 2023-04-12 MED ORDER — SODIUM CHLORIDE 0.9 % IV SOLN: INTRAVENOUS | Status: DC | PRN

## 2023-04-12 MED ORDER — ONDANSETRON 4 MG PO TBDP: 4.0000 mg | ORAL_TABLET | Freq: Four times a day (QID) | ORAL | Status: DC | PRN

## 2023-04-12 MED ORDER — LABETALOL HCL 5 MG/ML IV SOLN
INTRAVENOUS | Status: AC
  Filled 2023-04-12: qty 4

## 2023-04-12 MED ORDER — PHENYLEPHRINE 80 MCG/ML (10ML) SYRINGE FOR IV PUSH (FOR BLOOD PRESSURE SUPPORT)
PREFILLED_SYRINGE | INTRAVENOUS | Status: DC | PRN
  Administered 2023-04-12 (×4): 80 ug via INTRAVENOUS

## 2023-04-12 MED ORDER — SODIUM CHLORIDE 0.9 % IV SOLN
250.0000 mL | INTRAVENOUS | Status: DC
  Administered 2023-04-12 – 2023-05-11 (×5): 250 mL via INTRAVENOUS

## 2023-04-12 MED ORDER — CLEVIDIPINE BUTYRATE 0.5 MG/ML IV EMUL
INTRAVENOUS | Status: AC
  Filled 2023-04-12: qty 50

## 2023-04-12 MED ORDER — IOHEXOL 300 MG/ML  SOLN
150.0000 mL | Freq: Once | INTRAMUSCULAR | Status: AC | PRN
  Administered 2023-04-12: 100 mL via INTRA_ARTERIAL

## 2023-04-12 MED ORDER — STROKE: EARLY STAGES OF RECOVERY BOOK
Freq: Once | Status: AC
  Filled 2023-04-12: qty 1

## 2023-04-12 MED ORDER — MORPHINE SULFATE (PF) 2 MG/ML IV SOLN
1.0000 mg | INTRAVENOUS | Status: DC | PRN
  Administered 2023-04-20: 1 mg via INTRAVENOUS
  Administered 2023-04-21: 2 mg via INTRAVENOUS
  Filled 2023-04-12 (×3): qty 1

## 2023-04-12 MED ORDER — TRAMADOL HCL 50 MG PO TABS: 50.0000 mg | ORAL_TABLET | Freq: Four times a day (QID) | ORAL | Status: DC | PRN

## 2023-04-12 MED ORDER — NIMODIPINE 6 MG/ML PO SOLN
60.0000 mg | ORAL | Status: DC
  Administered 2023-04-12 – 2023-04-30 (×107): 60 mg
  Filled 2023-04-12 (×110): qty 10

## 2023-04-12 MED ORDER — ORAL CARE MOUTH RINSE: 15.0000 mL | OROMUCOSAL | Status: DC | PRN

## 2023-04-12 MED ORDER — ROCURONIUM BROMIDE 10 MG/ML (PF) SYRINGE
PREFILLED_SYRINGE | INTRAVENOUS | Status: DC | PRN
  Administered 2023-04-12: 100 mg via INTRAVENOUS
  Administered 2023-04-12: 50 mg via INTRAVENOUS

## 2023-04-12 MED ORDER — IOHEXOL 350 MG/ML SOLN
75.0000 mL | Freq: Once | INTRAVENOUS | Status: AC | PRN
  Administered 2023-04-12: 75 mL via INTRAVENOUS

## 2023-04-12 MED ORDER — CLEVIDIPINE BUTYRATE 0.5 MG/ML IV EMUL
0.0000 mg/h | INTRAVENOUS | Status: DC
  Administered 2023-04-12: 0 mg/h via INTRAVENOUS

## 2023-04-12 MED ORDER — ETOMIDATE 2 MG/ML IV SOLN
INTRAVENOUS | Status: AC | PRN
Start: 2023-04-12 — End: 2023-04-12
  Administered 2023-04-12: 20 mg via INTRAVENOUS

## 2023-04-12 MED ORDER — PHENYLEPHRINE HCL-NACL 20-0.9 MG/250ML-% IV SOLN
25.0000 ug/min | INTRAVENOUS | Status: DC
  Administered 2023-04-12: 25 ug/min via INTRAVENOUS
  Filled 2023-04-12: qty 250

## 2023-04-12 MED ORDER — DOCUSATE SODIUM 100 MG PO CAPS
100.0000 mg | ORAL_CAPSULE | Freq: Two times a day (BID) | ORAL | Status: DC
  Administered 2023-04-12: 100 mg via ORAL
  Filled 2023-04-12 (×2): qty 1

## 2023-04-12 MED ORDER — FENTANYL CITRATE PF 50 MCG/ML IJ SOSY
PREFILLED_SYRINGE | INTRAMUSCULAR | Status: AC
  Administered 2023-04-12: 25 ug
  Filled 2023-04-12: qty 1

## 2023-04-12 MED ORDER — PROPOFOL 1000 MG/100ML IV EMUL
5.0000 ug/kg/min | INTRAVENOUS | Status: DC
  Administered 2023-04-12: 40 ug/kg/min via INTRAVENOUS
  Administered 2023-04-12: 30 ug/kg/min via INTRAVENOUS
  Administered 2023-04-12: 15 ug/kg/min via INTRAVENOUS
  Administered 2023-04-13: 5 ug/kg/min via INTRAVENOUS
  Administered 2023-04-14: 15 ug/kg/min via INTRAVENOUS
  Filled 2023-04-12 (×5): qty 100

## 2023-04-12 MED ORDER — SODIUM CHLORIDE 0.9 % IV SOLN
1.0000 g | INTRAVENOUS | Status: DC
  Administered 2023-04-12: 1 g via INTRAVENOUS
  Filled 2023-04-12 (×2): qty 10

## 2023-04-12 MED ORDER — NIMODIPINE 30 MG PO CAPS: 60.0000 mg | ORAL_CAPSULE | ORAL | Status: DC

## 2023-04-12 MED ORDER — PHENYLEPHRINE HCL-NACL 20-0.9 MG/250ML-% IV SOLN
INTRAVENOUS | Status: DC | PRN
  Administered 2023-04-12: 20 ug/min via INTRAVENOUS

## 2023-04-12 MED ORDER — SODIUM BICARBONATE 8.4 % IV SOLN: 100.0000 meq | Freq: Once | INTRAVENOUS | Status: DC

## 2023-04-12 MED ORDER — LABETALOL HCL 5 MG/ML IV SOLN: 20.0000 mg | Freq: Once | INTRAVENOUS | Status: DC

## 2023-04-12 MED ORDER — PANTOPRAZOLE SODIUM 40 MG IV SOLR
40.0000 mg | Freq: Every day | INTRAVENOUS | Status: DC
  Administered 2023-04-13 – 2023-04-30 (×16): 40 mg via INTRAVENOUS
  Filled 2023-04-12 (×16): qty 10

## 2023-04-12 MED ORDER — ONDANSETRON HCL 4 MG/2ML IJ SOLN
4.0000 mg | Freq: Four times a day (QID) | INTRAMUSCULAR | Status: DC | PRN
  Administered 2023-04-13: 4 mg via INTRAVENOUS
  Filled 2023-04-12: qty 2

## 2023-04-12 MED ORDER — ACETAMINOPHEN 650 MG RE SUPP: 650.0000 mg | RECTAL | Status: DC | PRN

## 2023-04-12 MED ORDER — CLEVIDIPINE BUTYRATE 0.5 MG/ML IV EMUL
0.0000 mg/h | INTRAVENOUS | Status: DC
  Administered 2023-04-12: 2 mg/h via INTRAVENOUS
  Filled 2023-04-12: qty 50

## 2023-04-12 MED ORDER — ACETAMINOPHEN 325 MG PO TABS
650.0000 mg | ORAL_TABLET | ORAL | Status: DC | PRN
  Administered 2023-04-27: 650 mg via ORAL
  Filled 2023-04-12: qty 2

## 2023-04-12 NOTE — ED Notes (Signed)
NIH not completed due to EVD placement at this time.

## 2023-04-12 NOTE — ED Notes (Signed)
Family at bedside. 

## 2023-04-12 NOTE — Consult Note (Addendum)
NAME:  Wendy Fleming, MRN:  [id number], DOB:  [birth year], LOS: 0 ADMISSION DATE:  04/12/2023, CONSULTATION DATE:  04/12/23 REFERRING MD:  NSGY, CHIEF COMPLAINT:  AMS   History of Present Illness:  82 year old woman w/ hx of HTN presented after husband found her down minimally responsive.  Vomited multiple times en route to ER.  Intubated for airway protection in ER.  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  NSGY to place EVD and determine best method of securing aneurysm.  PCCM to consult for vent management.  Hunt Hess 3/4 mFS 2 (thin, +IVH)  Pertinent  Medical History  HTN Anemia  Significant Hospital Events: Including procedures, antibiotic start and stop dates in addition to other pertinent events   8/30 admit  Interim History / Subjective:  On vent  Objective   Blood pressure 132/83, pulse 72, temperature (!) 95.5 F (35.3 C), resp. rate 18, height 5\' 2"  (1.575 m), weight 90.2 kg, SpO2 100%.    Vent Mode: PRVC FiO2 (%):  [100 %] 100 % Set Rate:  [18 bmp-24 bmp] 24 bmp Vt Set:  [400 mL] 400 mL PEEP:  [5 cmH20] 5 cmH20 Plateau Pressure:  [15 cmH20] 15 cmH20   Intake/Output Summary (Last 24 hours) at 04/12/2023 1149 Last data filed at 04/12/2023 1124 Gross per 24 hour  Intake 11.76 ml  Output 70 ml  Net -58.24 ml   Filed Weights   04/12/23 1000  Weight: 90.2 kg    Examination: General: elderly woman on vent HENT: pupils conjugate, reactive, ~3mm, equal Lungs: clear, no wheezing or rhonci Cardiovascular: RRR, ext warm Abdomen: soft, +BS Extremities: no edema Neuro: Moving all 4 ext  GU: foley in place  Resolved Hospital Problem list   N/A  Assessment & Plan:  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  HH 3/4, mFS 2 Acute hypoxemic respiratory failure due to encephalopathy from above as well as presumed aspiration  - Vent  bundle - Sedation for vent synchrony - Adjust meds for cerebral perfusion pressure at direction of neurosurgery - Nimodipine for vasospasm ppx - Monitor for DI or CSW - Aneurysm mode of repair if offered per neurosurgery - TCDs at NSGY discretion - Zosyn for aspiration PNA - Will follow  Best Practice (right click and "Reselect all SmartList Selections" daily)   Diet/type: NPO DVT prophylaxis: SCD GI prophylaxis: PPI Lines: N/A Foley:  Yes, and it is still needed Code Status:  full code Last date of multidisciplinary goals of care discussion [per primary]  Labs   CBC: Recent Labs  Lab 04/12/23 0957 04/12/23 1008 04/12/23 1128  WBC 11.5*  --   --   NEUTROABS 6.5  --   --   HGB 13.4 14.3 15.3*  HCT 42.8 42.0 45.0  MCV 92.4  --   --   PLT 240  --   --     Basic Metabolic Panel: Recent Labs  Lab 04/12/23 0957 04/12/23 1008 04/12/23 1128  NA 136 139 139  K 3.6 3.7 3.6  CL 104 105  --   CO2 20*  --   --   GLUCOSE 196* 191*  --   BUN 13 14  --   CREATININE 0.88 0.80  --   CALCIUM 9.0  --   --    GFR: Estimated Creatinine Clearance: 58.5 mL/min (by C-G formula based on SCr of 0.8 mg/dL). Recent Labs  Lab 04/12/23 0957  WBC 11.5*  Liver Function Tests: Recent Labs  Lab 04/12/23 0957  AST 21  ALT 18  ALKPHOS 62  BILITOT 0.8  PROT 6.5  ALBUMIN 4.0   No results for input(s): "LIPASE", "AMYLASE" in the last 168 hours. No results for input(s): "AMMONIA" in the last 168 hours.  ABG    Component Value Date/Time   PHART 7.166 (LL) 04/12/2023 1128   PCO2ART 67.2 (HH) 04/12/2023 1128   PO2ART 44 (L) 04/12/2023 1128   HCO3 24.3 04/12/2023 1128   TCO2 26 04/12/2023 1128   ACIDBASEDEF 6.0 (H) 04/12/2023 1128   O2SAT 66 04/12/2023 1128     Coagulation Profile: Recent Labs  Lab 04/12/23 0957  INR 1.0    Cardiac Enzymes: No results for input(s): "CKTOTAL", "CKMB", "CKMBINDEX", "TROPONINI" in the last 168 hours.  HbA1C: No results found for:  "HGBA1C"  CBG: Recent Labs  Lab 04/12/23 1005  GLUCAP 176*    Review of Systems:   Intubated/encephalopathic  Past Medical History:  She,  has a past medical history of Anemia, Arthritis, Headache, and Hypertension.   Surgical History:   Past Surgical History:  Procedure Laterality Date   ankle surgery for fracture Right 01-19-2014   COLONOSCOPY WITH PROPOFOL N/A 06/07/2014   Procedure: COLONOSCOPY WITH PROPOFOL;  Surgeon: Charolett Bumpers, MD;  Location: [facility];  Service: Endoscopy;  Laterality: N/A;   GALLBLADDER SURGERY     TUBAL LIGATION       Social History:   reports that she quit smoking about 13 years ago. Her smoking use included cigarettes. She started smoking about 23 years ago. She has a 2.5 pack-year smoking history. She has never used smokeless tobacco. She reports current alcohol use. She reports that she does not use drugs.   Family History:  Her family history includes Breast cancer in her cousin, maternal aunt, and sister.   Allergies Allergies  Allergen Reactions   Erythromycin Swelling    Other Reaction(s): rash/swelling   Penicillins Hives    Other Reaction(s): rash/swelling   Streptomycin Swelling    Other Reaction(s): rash/swelling   Sulfa Antibiotics Nausea And Vomiting   Tetracycline Hcl     Other Reaction(s): rash/swelling   Tetracyclines & Related Swelling     Home Medications  Prior to Admission medications   Medication Sig Start Date End Date Taking? Authorizing Provider  acetaminophen (TYLENOL) 325 MG tablet Take 650 mg by mouth every 6 (six) hours as needed (pain).   Yes [provider]  cholecalciferol (VITAMIN D) 1000 UNITS tablet Take 1,000 Units by mouth every morning.    Yes [provider]  Glucosamine HCl (GLUCOSAMINE PO) Take by mouth.   Yes [provider]  Multiple Vitamin (MULTIVITAMIN WITH MINERALS) TABS Take 1 tablet by mouth every morning.    Yes [provider]  Naproxen Sodium  (ALEVE) 220 MG CAPS Take 1 capsule by mouth once as needed (pain.).   Yes [provider]  trandolapril (MAVIK) 4 MG tablet Take 4 mg by mouth every morning.    Yes [provider]  PRESCRIPTION MEDICATION Apply 1 application topically 2 (two) times a week. Compounded cream for ankle pain. Patient not taking: Reported on 04/12/2023    [provider]     Critical care time: 34 mins

## 2023-04-12 NOTE — ED Triage Notes (Signed)
Pt BIBGEMS from home. Last seen well at 0800. Pt baseline is able to ambulate and speak with no difficulties. When found by the husband when he returned from his walk patient was found in recumbent position, with urination, feces, and emesis around her. Patient was carried to the stretcher, she had episodes of emesis. Rhonchi bilaterally. Patient was able to answer orientation questions, before losing consciousness. Pt was then in and out of consciousness for EMS and had multiple episodes where she went bradycardic.   18 g LAC 190/114 CBG 142 HR80-90s Capnography 50

## 2023-04-12 NOTE — Progress Notes (Signed)
Pt was transported to 4n27 without complication.

## 2023-04-12 NOTE — Transfer of Care (Signed)
Immediate Anesthesia Transfer of Care Note  Patient: Wendy Fleming  Procedure(s) Performed: IR WITH ANESTHESIA  Patient Location: PACU and NICU  Anesthesia Type:General  Level of Consciousness: Patient remains intubated per anesthesia plan  Airway & Oxygen Therapy: Patient remains intubated per anesthesia plan  Post-op Assessment: Report given to RN and Post -op Vital signs reviewed and stable  Post vital signs: Reviewed and stable  Last Vitals:  Vitals Value Taken Time  BP    Temp    Pulse 66 04/12/23 1615  Resp 25 04/12/23 1615  SpO2 98 % 04/12/23 1615  Vitals shown include unfiled device data.  Last Pain: There were no vitals filed for this visit.       Complications: No notable events documented.

## 2023-04-12 NOTE — Progress Notes (Signed)
Responded to code Stroke page to support pt. I escorted pt's husband to bedside.  Per pt's doctor pt will be admitted. Lunette Stands available as needed.    Venida Jarvis, [place], [facility], Pager [phone]

## 2023-04-12 NOTE — Code Documentation (Signed)
Stroke Response Nurse Documentation Code Documentation  Wendy Fleming is a 82 y.o. female arriving to [facility]  via [facility] on 04/12/2023 with no major past medical hx. Code stroke was activated by ED.   Patient from home where she was LKW at 0800 and now complaining of being found unresponsive by husband. Pt was at her house and husband stepped away at 0800. When he returned, the patient was found down in the middle of the floor unresponsive. No visual abrasions and trauma noted to the patient. Called EMS and EMS reports mild responsiveness from patient, but nothing extensive. Arrived to the ED and EDP reports some movement on the left, but no movement found on the right. Patient would not follow commands or speak. She vomited on arrival. Stroke Team arrived to the bedside while the patient was being intubated for airway protection.    Stroke team at the bedside on patient arrival. Labs drawn and patient cleared for CT by Dr. Rodena Medin after intubation. Patient to CT with team. NIHSS 27, see documentation for details and code stroke times. Patient with decreased LOC, disoriented, not following commands, bilateral hemianopia, bilateral facial droop, bilateral arm weakness, bilateral leg weakness, Global aphasia , and dysarthria  on exam. The following imaging was completed:  CT Head and CTA. Patient is not a candidate for IV Thrombolytic due to hemorrhage on iimaging per provider.   Care Plan: Contact Neurosurgery. NIHSS q1 with Pupils. BP Goal 130-150.    Bedside handoff with ED RN Elexes.    Lucila Maine  Stroke Response RN

## 2023-04-12 NOTE — H&P (Signed)
CC: [facility]  HPI:     Patient is a 82 y.o. female w/o significant PMH who presented to ED via EMS. History provided by husband who states he left for a walk about 0800 and at that time pt was normal. He returned and pt was minimally responsive and he called 911. She vomited multiple times during EMS transport and was emergently intubated upon arrival to ED. 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     Patient Active Problem List   Diagnosis Date Noted   ICH (intracerebral hemorrhage) (HCC) 04/12/2023   Past Medical History:  Diagnosis Date   Anemia    many years ago   Arthritis    Headache    rare   Hypertension     Past Surgical History:  Procedure Laterality Date   ankle surgery for fracture Right 01-19-2014   COLONOSCOPY WITH PROPOFOL N/A 06/07/2014   Procedure: COLONOSCOPY WITH PROPOFOL;  Surgeon: Charolett Bumpers, MD;  Location: [facility];  Service: Endoscopy;  Laterality: N/A;   GALLBLADDER SURGERY     TUBAL LIGATION      (Not in a hospital admission)  Allergies  Allergen Reactions   Erythromycin Swelling    Other Reaction(s): rash/swelling   Penicillins Hives    Other Reaction(s): rash/swelling   Streptomycin Swelling    Other Reaction(s): rash/swelling   Sulfa Antibiotics Nausea And Vomiting   Tetracycline Hcl     Other Reaction(s): rash/swelling   Tetracyclines & Related Swelling    Social History   Tobacco Use   Smoking status: Former    Current packs/day: 0.00    Average packs/day: 0.3 packs/day for 10.0 years (2.5 ttl pk-yrs)    Types: Cigarettes    Start date: 06/07/1999    Quit date: 06/06/2009    Years since quitting: 13.8   Smokeless tobacco: Never  Substance Use Topics   Alcohol use: Yes    Comment: very rare    Family History  Problem Relation Age of Onset   Breast cancer Maternal Aunt    Breast cancer Cousin    Breast cancer Sister      Objective:   Patient Vitals for the past 8  hrs:  BP Temp Pulse Resp SpO2 Height Weight  04/12/23 1104 132/83 (!) 95.5 F (35.3 C) 72 18 100 % -- --  04/12/23 1102 132/62 (!) 95.5 F (35.3 C) 77 19 100 % -- --  04/12/23 1100 123/69 (!) 95.5 F (35.3 C) 69 17 100 % -- --  04/12/23 1058 124/80 (!) 95.5 F (35.3 C) 77 17 100 % -- --  04/12/23 1055 -- (!) 95.5 F (35.3 C) 80 17 100 % -- --  04/12/23 1054 134/72 (!) 95.5 F (35.3 C) 81 (!) 21 100 % -- --  04/12/23 1052 (!) 127/97 (!) 95.5 F (35.3 C) 80 (!) 25 100 % -- --  04/12/23 1050 137/69 -- 72 20 100 % -- --  04/12/23 1048 -- (!) 95.5 F (35.3 C) 85 17 100 % -- --  04/12/23 1047 -- (!) 95.5 F (35.3 C) 88 (!) 21 100 % -- --  04/12/23 1045 124/64 (!) 95.5 F (35.3 C) 76 (!) 23 100 % -- --  04/12/23 1044 130/68 (!) 95.4 F (35.2 C) 77 18 100 % -- --  04/12/23 1042 124/80 -- 80 (!) 23 100 % -- --  04/12/23 1041 -- (!) 95.1 F (35.1 C) 85 17 100 % -- --  04/12/23 1040 128/71 --  84 (!) 24 100 % -- --  04/12/23 1038 123/72 -- 83 (!) 26 100 % -- --  04/12/23 1036 126/72 -- 86 20 100 % -- --  04/12/23 1034 129/74 -- 86 16 100 % -- --  04/12/23 1033 132/82 -- 87 (!) 22 100 % -- --  04/12/23 1030 -- -- 88 18 100 % -- --  04/12/23 1028 (!) 139/117 -- 82 (!) 24 100 % -- --  04/12/23 1024 (!) 177/94 -- 76 13 100 % -- --  04/12/23 1021 (!) 165/103 -- -- -- -- -- --  04/12/23 1018 (!) 165/112 -- -- -- -- -- --  04/12/23 1003 (!) 160/94 -- 92 (!) 23 100 % -- --  04/12/23 1001 -- -- -- -- -- 5\' 2"  (1.575 m) --  04/12/23 1000 (!) 177/92 -- 88 (!) 23 100 % -- 90.2 kg   No intake/output data recorded. Total I/O In: 9.1 [I.V.:9.1] Out: 70 [Urine:50; Emesis/NG output:20]   Intubated, eyes closed L pupill 3mm, reactive. R pupil 2mm, reactive.  MAEs  Not localizing to pain  Assessment:   This is a 82yo female with intracranial hemorrhage with ventricular involvement presumably secondary to ruptured ACOM aneurysm.    Plan:   -R EVD placed at bedside w/o complication. Set to  20cm above EAC.  -Frequent neuro checks -Plan for diagnostic angiogram -SBP goal <150 -Call w/ questions/concerns.   Artist Bloom Margaree Mackintosh, PA-C

## 2023-04-12 NOTE — Progress Notes (Signed)
Pt was transported to CT scan and back without complications.  

## 2023-04-12 NOTE — Progress Notes (Signed)
 An USGPIV (ultrasound guided PIV) has been placed for short-term vasopressor infusion. A correctly placed ivWatch must be used when administering Vasopressors. Should this treatment be needed beyond 24 hours, central line access should be obtained.  It will be the responsibility of the bedside nurse to follow best practice to prevent extravasations.

## 2023-04-12 NOTE — ED Notes (Signed)
Paper consent signed and placed in medical records 

## 2023-04-12 NOTE — Consult Note (Addendum)
NEUROLOGY CONSULTATION NOTE   Date of service: April 12, 2023 [name] MRN:  [id number] DOB:  05-13-1941 Reason for consult: "code stroke" Requesting Provider: Bedelia Person, MD  History of Present Illness  Wendy Fleming is a 82 y.o. female who has a past medical history of Anemia, Arthritis, Headache, and Hypertension. Who was BIB EMS after being found unresponsive, code stroke activated in ED. Per EMS, husband found her unresponsive on the floor. EMS states she was semi-responsive with them. Per ED physician, patient had minimal movement to painful stimuli on the left only. Patient vomited on arrival and she was intubated for airway protection before going to CT. BP slightly elevated in the 160s/170s, CBG 176. On assessment, patient was just intubated with sluggish pupils, no other reflexes noted. NIH 27. Taken emergently to CT and CTA which showed an ACOM aneurysm presumably ruptured and the source of large IVH, hydrocephalus and possible transpendymal interstitial edema.  Discussed with Dr. Rodena Medin, Neurosurgery contacted for admission.    ROS   Unable to assess due to patient's altered mental status.   Past History   Past Medical History:  Diagnosis Date   Anemia    many years ago   Arthritis    Headache    rare   Hypertension    Past Surgical History:  Procedure Laterality Date   ankle surgery for fracture Right 01-19-2014   COLONOSCOPY WITH PROPOFOL N/A 06/07/2014   Procedure: COLONOSCOPY WITH PROPOFOL;  Surgeon: Charolett Bumpers, MD;  Location: [facility];  Service: Endoscopy;  Laterality: N/A;   GALLBLADDER SURGERY     TUBAL LIGATION     Family History  Problem Relation Age of Onset   Breast cancer Maternal Aunt    Breast cancer Cousin    Breast cancer Sister    Social History   Socioeconomic History   Marital status: Married    Spouse name: Not on file   Number of children: Not on file   Years of education: Not on file    Highest education level: Not on file  Occupational History   Not on file  Tobacco Use   Smoking status: Former    Current packs/day: 0.00    Average packs/day: 0.3 packs/day for 10.0 years (2.5 ttl pk-yrs)    Types: Cigarettes    Start date: 06/07/1999    Quit date: 06/06/2009    Years since quitting: 13.8   Smokeless tobacco: Never  Substance and Sexual Activity   Alcohol use: Yes    Comment: very rare   Drug use: No   Sexual activity: Never  Other Topics Concern   Not on file  Social History Narrative   Not on file   Social Determinants of Health   Financial Resource Strain: Not on file  Food Insecurity: Not on file  Transportation Needs: Not on file  Physical Activity: Not on file  Stress: Not on file  Social Connections: Not on file   Allergies  Allergen Reactions   Erythromycin Swelling   Penicillins Hives   Streptomycin Swelling   Sulfa Antibiotics Nausea And Vomiting   Tetracyclines & Related Swelling    Medications  (Not in a hospital admission)    Vitals   Vitals:   04/12/23 1050 04/12/23 1052 04/12/23 1054 04/12/23 1055  BP: 137/69 (!) 127/97 134/72   Pulse: 72 80 81 80  Resp: 20 (!) 25 (!) 21 17  Temp:  (!) 95.5 F (35.3 C) (!) 95.5 F (35.3  C) (!) 95.5 F (35.3 C)  SpO2: 100% 100% 100% 100%  Weight:      Height:         Body mass index is 36.37 kg/m.  Physical Exam   General: Critically ill, intubated.  HENT: ETT in place CV: Sinus tahcycardia on monitor.  Pulmonary: Intubated, mechanically ventilated with full support   Neurologic Examination   Patient is intubated (succinylcholine given for RSI). Eyes do not open to voice or noxious stimuli. With forced opening, pupils 4 and sluggish, dysconjugate. No corneal, cough or gag. No response to noxious stimuli. No spontaneous movement. Does not follow commands.    Labs   CBC:  Recent Labs  Lab 04/12/23 0957 04/12/23 1008  WBC 11.5*  --   NEUTROABS 6.5  --   HGB 13.4 14.3   HCT 42.8 42.0  MCV 92.4  --   PLT 240  --     Basic Metabolic Panel:  Lab Results  Component Value Date   NA 139 04/12/2023   K 3.7 04/12/2023   CO2 20 (L) 04/12/2023   GLUCOSE 191 (H) 04/12/2023   BUN 14 04/12/2023   CREATININE 0.80 04/12/2023   CALCIUM 9.0 04/12/2023   GFRNONAA >60 04/12/2023   GFRAA >90 06/07/2012   Lipid Panel: No results found for: "LDLCALC" HgbA1c: No results found for: "HGBA1C" Urine Drug Screen: No results found for: "LABOPIA", "COCAINSCRNUR", "LABBENZ", "AMPHETMU", "THCU", "LABBARB"  Alcohol Level     Component Value Date/Time   ETH <10 04/12/2023 1008    CT Head without contrast/CT angio Head and Neck with contrast(Personally reviewed):  Large acute hemorrhage with the lateral, third and fourth ventricles. Associated hydrocephalus with possible transpendymal interstitial edema.  11x23mm ACA aneurysm, presumably ruptured and source of ICH No LVO    Impression   Wendy Fleming is a 82 y.o. female with PMH significant for hypertension and headache. Her neurologic examination is notable for negative corneal/cough/gag reflexes, no response to noxious stimuli, currently intubated for airway protection. Imaging shows a large IVH, likely due to [facility] aneurysm rupture.   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   Recommendations  - SBP goal <160 - cleviprex gtt ordered - admit to neurosurgery - neurology will be available as needed ______________________________________________________________________   Thank you for the opportunity to take part in the care of this patient. If you have any further questions, please contact the neurology consultation attending.   NEUROHOSPITALIST ADDENDUM Performed a face to face diagnostic evaluation.   I have reviewed the contents of history and physical exam as documented by PA/ARNP/Resident and agree with above documentation.  I have discussed and  formulated the above plan as documented. Edits to the note have been made as needed.  Impression/Key exam findings/Plan: presents with sudden unresponsiveness and intubated in the ED. Pupils reactive post intubation but rest of the brainstem reflexes are absent. CT Head with extensive IVH, some SAH and developing hydrocephalus. CTA with large Acomm aneurysm that is presumed ruptured.  Started on cleviprex and ED team requested to consult neurosurgery team. Not much for [place] to do. We will signoff. Please feel free to contact us with any questions or concerns.  This is not a primary ICH but a aneurysmal SAH with IVH. The ICH score does not apply.  This patient is critically ill and at significant risk of neurological worsening, death and care requires constant monitoring of vital signs, hemodynamics,respiratory and cardiac monitoring, neurological assessment,  discussion with family, other specialists and medical decision making of high complexity. I spent 40 minutes of neurocritical care time  in the care of  this patient. This was time spent independent of any time provided by nurse practitioner or PA.  Erick Blinks [facility] 04/12/2023  6:33 PM  Erick Blinks, MD [facility] [phone]   If 7pm to 7am, please call on call as listed on AMION.

## 2023-04-12 NOTE — Progress Notes (Signed)
  NEUROSURGERY PROGRESS NOTE   History reviewed with patient's husband/friend and in EMR per Dr. Maisie Fus.  Briefly, the patient was found down by her husband earlier this morning after he went for about a 30-minute walk.  She was brought in by EMS and intubated for airway protection.  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  Per the husband, the patient does have a history of medically controlled hypertension.  He does not report any other significant medical problem including diabetes, history of heart disease or stroke, or known lung, liver, kidney disease.  She does have a history of osteoarthritis.  She is not on any blood thinners or antiplatelet agents.  She is a non-smoker. There is no known family history of intracranial aneurysms or unexplained intracranial hemorrhage.   EXAM:  BP (!) 138/92   Pulse 68   Temp (!) 97.2 F (36.2 C)   Resp 20   Ht 5\' 2"  (1.575 m)   Wt 90.2 kg   SpO2 100%   BMI 36.37 kg/m   No eye opening to pain Pupils 3mm, reactive Breathing over vent Localizes BUE, R>L Moves BLE spontaneously EVD in place, patent with bloody CSF  IMAGING: CT scan of the head was personally reviewed and demonstrates diffuse basal subarachnoid hemorrhage.  There is also significant intraventricular hemorrhage with ventriculomegaly.  CT angiogram was also personally reviewed and demonstrates a dominant left A1.  There is a inferiorly and posteriorly projecting aneurysm arising from the left A1 A2 junction.  Aneurysm measures approximately 11 mm in maximal dimension.  Interestingly, there is also significant asymmetric contrast opacification within the left internal jugular vein, transverse and sigmoid sinuses as well as the perivertebral venous plexus.  IMPRESSION:  82 y.o. 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  After review of the CT angiogram I suspect we can likely coil the aneurysm.  I have reviewed the situation with the patient's husband and family friend who is a Engineer, civil (consulting) in the emergency department.  I have reviewed with them the details of the endovascular procedure above as well as the possible need for craniotomy.  We have also discussed the details of the open surgical procedure.  We have discussed the risks associated with both procedures including primarily risk of stroke or further hemorrhage leading to worsening neurologic function, coma, death.  We have also discussed risk of arterial dissection, groin hematoma, and contrast nephropathy as well as risk of seizures and infection.  All their questions were answered.  The patient's husband provided informed consent to proceed as above.   Lisbeth Renshaw, MD [facility]

## 2023-04-12 NOTE — Progress Notes (Signed)
1245:  Patient transferred from ED to 4NICU. Informed by neurosurgeon that patient will head to IR immediately.    1300:  Patient transfer to IR.   1600:  Patient returned from IR.  Neurosurgeon at bedside and will update family.  Blood pressure parameter changed per neurosurgeon, systolic less than 160.

## 2023-04-12 NOTE — TOC CM/SW Note (Signed)
Transition of Care [facility]) - Inpatient Brief Assessment   Patient Details  Name: XIOMARA STRAKA MRN: [id number] Date of Birth: Jan 11, 1941  Transition of Care [facility]) CM/SW Contact:    Mearl Latin, LCSW Phone Number: 04/12/2023, 3:19 PM   Clinical Narrative: Patient admitted from home with spouse and is currently intubated. Please place [facility] consult if needs arise.     Transition of Care Asessment: Insurance and Status: Insurance coverage has been reviewed Patient has primary care physician: Yes Home environment has been reviewed: From home Prior level of function:: Independent Prior/Current Home Services: No current home services Social Determinants of Health Reivew: SDOH reviewed no interventions necessary Readmission risk has been reviewed: Yes Transition of care needs: no transition of care needs at this time

## 2023-04-12 NOTE — ED Provider Notes (Signed)
[place] EMERGENCY DEPARTMENT AT [facility] Provider Note   CSN: [id number] Arrival date & time: 04/12/23  4132     History  No chief complaint on file.   Wendy Fleming is a 82 y.o. female.  82 year old female with prior history detailed below presents for evaluation.  Patient arrives with EMS transport from home.  Patient reportedly was last known well around 8 AM.  Husband last witnessed the patient in her normal state of health at that time.  Husband left the patient alone and then returned to find her minimally responsive.  EMS reports the patient was minimally responsive and then became significantly more obtunded during transport.  Patient did vomit once during transport and then again on arrival.  EMS is concerned for possible and likely aspiration event.  Patient is obtunded and unresponsive.  She is nonverbal.  Eyes do not open with verbal or painful stimulation.  Patient does not localize to painful stimulation.  Patient is actively vomiting on arrival to the room.  Patient intubated on arrival for protection of airway.  Code stroke initiated on arrival.  The history is provided by the patient, medical records and the EMS personnel.       Home Medications Prior to Admission medications   Medication Sig Start Date End Date Taking? Authorizing Provider  acetaminophen (TYLENOL) 325 MG tablet Take 650 mg by mouth every 6 (six) hours as needed (pain).    [provider]  cholecalciferol (VITAMIN D) 1000 UNITS tablet Take 1,000 Units by mouth every morning.     [provider]  Glucosamine HCl (GLUCOSAMINE PO) Take by mouth.    [provider]  Multiple Vitamin (MULTIVITAMIN WITH MINERALS) TABS Take 1 tablet by mouth every morning.     [provider]  Naproxen Sodium (ALEVE) 220 MG CAPS Take 1 capsule by mouth once as needed (pain.).    [provider]  PRESCRIPTION MEDICATION Apply 1 application topically 2 (two)  times a week. Compounded cream for ankle pain.    [provider]  trandolapril (MAVIK) 4 MG tablet Take 4 mg by mouth every morning.     [provider]      Allergies    Erythromycin, Penicillins, Streptomycin, Sulfa antibiotics, and Tetracyclines & related    Review of Systems   Review of Systems  Unable to perform ROS: Acuity of condition    Physical Exam Updated Vital Signs There were no vitals taken for this visit. Physical Exam Vitals and nursing note reviewed.  Constitutional:      General: She is not in acute distress.    Appearance: She is well-developed.     Comments: Obtunded, vomiting actively.  No eye-opening with verbal stimulation.  No significant localization with painful stimulation.  Patient with shallow breathing and snoring sounds.  Minimal active movement of the left upper extremity with painful stimulation.  HENT:     Head: Normocephalic and atraumatic.  Eyes:     Conjunctiva/sclera: Conjunctivae normal.     Comments: Pupils sluggish but approximately 3 to 4 mm bilaterally.  Cardiovascular:     Rate and Rhythm: Normal rate and regular rhythm.     Heart sounds: Normal heart sounds.  Pulmonary:     Effort: No respiratory distress.     Comments: Sonorous breath sounds.  Actively vomiting. Abdominal:     General: There is no distension.     Palpations: Abdomen is soft.     Tenderness: There is no abdominal  tenderness.  Musculoskeletal:        General: No deformity. Normal range of motion.     Cervical back: Normal range of motion and neck supple.  Skin:    General: Skin is warm and dry.  Neurological:     Comments: Obtunded, no response to verbal stimulation.  Minimal movement of the left upper extremity with painful stimulation to that extremity.  Otherwise no localization.  No active movement of all 4 extremities without painful stimulation.     ED Results / Procedures / Treatments   Labs (all labs ordered are listed, but  only abnormal results are displayed) Labs Reviewed  CBC - Abnormal; Notable for the following components:      Result Value   WBC 11.5 (*)    All other components within normal limits  I-STAT CHEM 8, ED - Abnormal; Notable for the following components:   Glucose, Bld 191 (*)    All other components within normal limits  CBG MONITORING, ED - Abnormal; Notable for the following components:   Glucose-Capillary 176 (*)    All other components within normal limits  DIFFERENTIAL  ETHANOL  PROTIME-INR  APTT  COMPREHENSIVE METABOLIC PANEL  RAPID URINE DRUG SCREEN, HOSP PERFORMED  URINALYSIS, ROUTINE W REFLEX MICROSCOPIC    EKG None  Radiology No results found.  Procedures Date/Time: 04/12/2023 10:18 AM  Performed by: Wynetta Fines, MDPre-anesthesia Checklist: Emergency Drugs available Oxygen Delivery Method: Ambu bag Preoxygenation: Pre-oxygenation with 100% oxygen Induction Type: IV induction Ventilation: Mask ventilation with difficulty Laryngoscope Size: Glidescope and 4 Grade View: Grade I Tube size: 7.5 mm Number of attempts: 1 Placement Confirmation: ETT inserted through vocal cords under direct vision Tube secured with: ETT holder        Medications Ordered in ED Medications  labetalol (NORMODYNE) injection 20 mg (has no administration in time range)    And  clevidipine (CLEVIPREX) infusion 0.5 mg/mL (has no administration in time range)  propofol (DIPRIVAN) 1000 MG/100ML infusion (has no administration in time range)    ED Course/ Medical Decision Making/ A&P                                 Medical Decision Making Amount and/or Complexity of Data Reviewed Labs: ordered. Radiology: ordered.  Risk Prescription drug management. Decision regarding hospitalization.    Medical Screen Complete  This patient presented to the ED with complaint of AMS.  This complaint involves an extensive number of treatment options. The initial differential  diagnosis includes, but is not limited to, intracranial hemorrhage, CVA, metabolic abnormality, etc.  This presentation is: Acute, Self-Limited, Previously Undiagnosed, Uncertain Prognosis, Complicated, Systemic Symptoms, and Threat to Life/Bodily Function  Patient with acute onset of altered mental status.  Last known well was 8 AM.  On arrival the patient is obtunded and vomiting.  Patient intubated for airway protection.  CT imaging reveals evidence of likely ruptured ACOM aneurysm.  Both neurology and neurosurgery involved in patient's care.  Patient to be admitted to neurosurgery.  Neurosurgery request critical care consult for vent management.  Husband made aware of the ED workup findings.    Additional history obtained:  Additional history obtained from EMS and Spouse External records from outside sources obtained and reviewed including prior ED visits and prior Inpatient records.    Lab Tests:  I ordered and personally interpreted labs.  The pertinent results include: CBC, BMP, INR, EtOH   Imaging  Studies ordered:  I ordered imaging studies including CT head, CT angio head and neck, chest x-ray, CT C-spine I independently visualized and interpreted obtained imaging which showed intracranial hemorrhage, likely ruptured ACOM aneurysm I agree with the radiologist interpretation.   Cardiac Monitoring:  The patient was maintained on a cardiac monitor.  I personally viewed and interpreted the cardiac monitor which showed an underlying rhythm of: NSR   Medicines ordered:  I ordered medication including RSI medications, propofol, Cleviprex for intubation, sedation, hypertension Reevaluation of the patient after these medicines showed that the patient: stayed the same  Problem List / ED Course:  AMS, intracranial hemorrhage   Reevaluation:  After the interventions noted above, I reevaluated the patient and found that they have: stayed the  same  Disposition:  After consideration of the diagnostic results and the patients response to treatment, I feel that the patent would benefit from admission.    CRITICAL CARE Performed by: Wynetta Fines   Total critical care time: 45 minutes  Critical care time was exclusive of separately billable procedures and treating other patients.  Critical care was necessary to treat or prevent imminent or life-threatening deterioration.  Critical care was time spent personally by me on the following activities: development of treatment plan with patient and/or surrogate as well as nursing, discussions with consultants, evaluation of patient's response to treatment, examination of patient, obtaining history from patient or surrogate, ordering and performing treatments and interventions, ordering and review of laboratory studies, ordering and review of radiographic studies, pulse oximetry and re-evaluation of patient's condition.         Final Clinical Impression(s) / ED Diagnoses Final diagnoses:  Altered mental status, unspecified altered mental status type    Rx / DC Orders ED Discharge Orders     None         Wynetta Fines, MD 04/12/23 1109

## 2023-04-12 NOTE — ED Notes (Signed)
Spoke with 4NICU, room to change.

## 2023-04-12 NOTE — Anesthesia Preprocedure Evaluation (Addendum)
Anesthesia Evaluation  Patient identified by MRN, date of birth, ID band Patient awake    Reviewed: Allergy & Precautions, H&P , NPO status , Patient's Chart, lab work & pertinent test results  Airway Mallampati: Intubated       Dental no notable dental hx. (+) Teeth Intact   Pulmonary neg pulmonary ROS Intubated and sedated   Pulmonary exam normal breath sounds clear to auscultation       Cardiovascular hypertension, Pt. on medications  Rhythm:Regular Rate:Normal     Neuro/Psych  Headaches CVA, Residual Symptoms  negative psych ROS   GI/Hepatic negative GI ROS, Neg liver ROS,,,  Endo/Other  negative endocrine ROS    Renal/GU negative Renal ROS  negative genitourinary   Musculoskeletal  (+) Arthritis , Osteoarthritis,    Abdominal   Peds  Hematology  (+) Blood dyscrasia, anemia   Anesthesia Other Findings   Reproductive/Obstetrics negative OB ROS                             Anesthesia Physical Anesthesia Plan  ASA: 4 and emergent  Anesthesia Plan: General   Post-op Pain Management:    Induction: Intravenous  PONV Risk Score and Plan: 3  Airway Management Planned: Oral ETT  Additional Equipment: Arterial line  Intra-op Plan:   Post-operative Plan: Post-operative intubation/ventilation  Informed Consent: I have reviewed the patients History and Physical, chart, labs and discussed the procedure including the risks, benefits and alternatives for the proposed anesthesia with the patient or authorized representative who has indicated his/her understanding and acceptance.     Dental advisory given  Plan Discussed with: CRNA  Anesthesia Plan Comments:        Anesthesia Quick Evaluation

## 2023-04-12 NOTE — Anesthesia Procedure Notes (Signed)
Arterial Line Insertion Start/End8/30/2024 1:40 PM, 04/12/2023 1:47 PM Performed by: Dorie Rank, CRNA, CRNA  Preanesthetic checklist: patient identified, IV checked, site marked, risks and benefits discussed, surgical consent, monitors and equipment checked, pre-op evaluation, timeout performed and anesthesia consent Left, radial was placed Catheter size: 20 G Hand hygiene performed  and maximum sterile barriers used   Attempts: 1 Procedure performed without using ultrasound guided technique. Following insertion, dressing applied.

## 2023-04-12 NOTE — Progress Notes (Signed)
SLP Cancellation Note  Patient Details Name: Wendy Fleming MRN: [id number] DOB: 1940-11-30   Cancelled treatment:       Reason Eval/Treat Not Completed: Patient not medically ready. Pt intubated. Will continue to follow to assess readiness for evaluation.   Gwynneth Aliment, M.A., CF-SLP Speech Language Pathology, Acute Rehabilitation Services  Secure Chat preferred [phone]  04/12/2023, 3:24 PM

## 2023-04-12 NOTE — Op Note (Signed)
  NEUROSURGERY BRIEF OPERATIVE  NOTE   PREOP DX: Subarachnoid Hemorrhage  POSTOP DX: Same  PROCEDURE: Diagnostic cerebral angiogram, Coil embolization Left Acom aneurysm  SURGEON: Dr. Lisbeth Renshaw, MD  ANESTHESIA: GETA  APPROACH: Right trans-femoral  EBL: Minimal  SPECIMENS: None  COMPLICATIONS: None  CONDITION: Stable to ICU  FINDINGS (Full report in [facility]): 1. Successful coil embolization of left Acom aneurysm   Lisbeth Renshaw, MD [facility]

## 2023-04-13 ENCOUNTER — Inpatient Hospital Stay (HOSPITAL_COMMUNITY): Payer: Medicare HMO

## 2023-04-13 ENCOUNTER — Encounter (HOSPITAL_COMMUNITY): Payer: Self-pay | Admitting: Neurosurgery

## 2023-04-13 DIAGNOSIS — I609 Nontraumatic subarachnoid hemorrhage, unspecified: Secondary | ICD-10-CM

## 2023-04-13 DIAGNOSIS — J9601 Acute respiratory failure with hypoxia: Secondary | ICD-10-CM | POA: Diagnosis not present

## 2023-04-13 DIAGNOSIS — G911 Obstructive hydrocephalus: Secondary | ICD-10-CM

## 2023-04-13 LAB — GLUCOSE, CAPILLARY
Glucose-Capillary: 124 mg/dL — ABNORMAL HIGH (ref 70–99)
Glucose-Capillary: 127 mg/dL — ABNORMAL HIGH (ref 70–99)
Glucose-Capillary: 129 mg/dL — ABNORMAL HIGH (ref 70–99)

## 2023-04-13 LAB — POCT I-STAT 7, (LYTES, BLD GAS, ICA,H+H)
Acid-base deficit: 5 mmol/L — ABNORMAL HIGH (ref 0.0–2.0)
Bicarbonate: 20.3 mmol/L (ref 20.0–28.0)
Calcium, Ion: 1.23 mmol/L (ref 1.15–1.40)
HCT: 35 % — ABNORMAL LOW (ref 36.0–46.0)
Hemoglobin: 11.9 g/dL — ABNORMAL LOW (ref 12.0–15.0)
O2 Saturation: 99 %
Potassium: 3.3 mmol/L — ABNORMAL LOW (ref 3.5–5.1)
Sodium: 141 mmol/L (ref 135–145)
TCO2: 22 mmol/L (ref 22–32)
pCO2 arterial: 39.9 mmHg (ref 32–48)
pH, Arterial: 7.315 — ABNORMAL LOW (ref 7.35–7.45)
pO2, Arterial: 153 mmHg — ABNORMAL HIGH (ref 83–108)

## 2023-04-13 LAB — MAGNESIUM: Magnesium: 1.7 mg/dL (ref 1.7–2.4)

## 2023-04-13 LAB — PHOSPHORUS: Phosphorus: 3.1 mg/dL (ref 2.5–4.6)

## 2023-04-13 MED ORDER — OSMOLITE 1.5 CAL PO LIQD: 1000.0000 mL | ORAL | Status: DC

## 2023-04-13 MED ORDER — LEVETIRACETAM IN NACL 500 MG/100ML IV SOLN
500.0000 mg | Freq: Two times a day (BID) | INTRAVENOUS | Status: DC
  Administered 2023-04-13 – 2023-04-17 (×8): 500 mg via INTRAVENOUS
  Filled 2023-04-13 (×8): qty 100

## 2023-04-13 MED ORDER — VITAL HIGH PROTEIN PO LIQD: 1000.0000 mL | ORAL | Status: DC

## 2023-04-13 MED ORDER — PROSOURCE TF20 ENFIT COMPATIBL EN LIQD
60.0000 mL | Freq: Every day | ENTERAL | Status: DC
  Administered 2023-04-13 – 2023-04-30 (×18): 60 mL
  Filled 2023-04-13 (×18): qty 60

## 2023-04-13 MED ORDER — MAGNESIUM SULFATE 2 GM/50ML IV SOLN
2.0000 g | Freq: Once | INTRAVENOUS | Status: AC
  Administered 2023-04-13: 2 g via INTRAVENOUS
  Filled 2023-04-13: qty 50

## 2023-04-13 MED ORDER — FENTANYL CITRATE PF 50 MCG/ML IJ SOSY: 25.0000 ug | PREFILLED_SYRINGE | INTRAMUSCULAR | Status: DC | PRN

## 2023-04-13 MED ORDER — SODIUM CHLORIDE 0.9 % IV SOLN
2.0000 g | INTRAVENOUS | Status: AC
  Administered 2023-04-13 – 2023-04-16 (×4): 2 g via INTRAVENOUS
  Filled 2023-04-13 (×4): qty 20

## 2023-04-13 NOTE — Progress Notes (Signed)
Transcranial Doppler  Date POD PCO2 HCT BP  MCA ACA PCA OPHT SIPH VERT Basilar  8/31     Right  Left   *  *   *  *   *  31   14  18    *  18   -24  -16   *           Right  Left                                            Right  Left                                             Right  Left                                             Right  Left                                            Right  Left                                            Right  Left                                        *unable to insonate   MCA = Middle Cerebral Artery      OPHT = Opthalmic Artery     BASILAR = Basilar Artery   ACA = Anterior Cerebral Artery     SIPH = Carotid Siphon PCA = Posterior Cerebral Artery   VERT = Verterbral Artery                   Normal MCA = 62+\-12 ACA = 50+\-12 PCA = 42+\-23

## 2023-04-13 NOTE — Progress Notes (Signed)
EVD level to 10 mmHg per Dr. Maisie Fus.

## 2023-04-13 NOTE — Progress Notes (Signed)
   04/13/23 0806  Daily Weaning Assessment  Daily Assessment of Readiness to Wean Wean protocol criteria met (SBT performed)  SBT Method CPAP 5 cm H20 and PS 5 cm H20 (PS 10)  Weaning Start Time 0806   Pt placed on wean 10/5 and is tolerating fairly well. Maintaining Mve 5.0.

## 2023-04-13 NOTE — Procedures (Signed)
PREOP DX: Hydrocephalus  POSTOP DX: Hydrocephalus  PROCEDURE: Right frontal ventriculostomy   SURGEON: Dr. Hoyt Koch, MD  ANESTHESIA: IV Sedation (versed and fentanyl) with Local  EBL: Minimal  SPECIMENS: None  COMPLICATIONS: None  CONDITION: Hemodynamically stable  INDICATIONS: Mrs. Wendy Fleming is a 82 y.o. 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  Family wished to proceed with EVD placement after discussion of risks, benefits, alternatives, and expected convalescence.  PROCEDURE IN DETAIL: After consent was obtained from the patient's family, skin of the right frontal scalp was clipped, prepped and draped in the usual sterile fashion.  A timeout was performed.  Scalp was then infiltrated with local anesthetic with epinephrine.  Skin incision was made sharply, and twist drill burr hole was made.  The dura was then incised, and the ventricular catheter was passed in first attempt into the right lateral ventricle.  Good CSF flow was obtained.  The catheter was then tunneled subcutaneously and connected to a drainage system and the skin incision closed.  The drain was then secured in place.  FINDINGS: 1. Opening pressure high, quickly normalized 2. bloody CSF

## 2023-04-13 NOTE — Progress Notes (Addendum)
Subjective: NAEs o/n  Objective: Vital signs in last 24 hours: Temp:  [95.1 F (35.1 C)-99.3 F (37.4 C)] 98.3 F (36.8 C) (08/31 0800) Pulse Rate:  [53-92] 62 (08/31 0900) Resp:  [10-32] 21 (08/31 0900) BP: (85-177)/(49-117) 102/64 (08/31 0900) SpO2:  [98 %-100 %] 100 % (08/31 0900) FiO2 (%):  [40 %-100 %] 40 % (08/31 0806) Weight:  [90.2 kg] 90.2 kg (08/30 1000)  Intake/Output from previous day: 08/30 0701 - 08/31 0700 In: 1728.9 [I.V.:1628.9; IV Piggyback:100] Out: 2367 [Urine:2200; Emesis/NG output:20; Drains:97; Blood:50] Intake/Output this shift: Total I/O In: 58.3 [I.V.:58.3] Out: 8 [Drains:8]  Intubated Eyes open to stim Pupils 2 mm bilaterally reactive Weak localization bilaterally EVD in place  Lab Results: Recent Labs    04/12/23 0957 04/12/23 1008 04/12/23 1305 04/12/23 1750  WBC 11.5*  --   --   --   HGB 13.4   < > 14.3 11.9*  HCT 42.8   < > 42.0 35.0*  PLT 240  --   --   --    < > = values in this interval not displayed.   BMET Recent Labs    04/12/23 0957 04/12/23 1008 04/12/23 1128 04/12/23 1305 04/12/23 1750  NA 136 139   < > 139 142  K 3.6 3.7   < > 3.9 3.4*  CL 104 105  --   --   --   CO2 20*  --   --   --   --   GLUCOSE 196* 191*  --   --   --   BUN 13 14  --   --   --   CREATININE 0.88 0.80  --   --   --   CALCIUM 9.0  --   --   --   --    < > = values in this interval not displayed.    Studies/Results: DG Chest Port 1 View  Result Date: 04/13/2023 CLINICAL DATA:  Intubation EXAM: PORTABLE CHEST 1 VIEW COMPARISON:  Yesterday FINDINGS: Endotracheal tube with tip between the clavicular heads and carina. An enteric tube reaches the stomach. Interstitial opacity asymmetric to the right with equivocal Kerley lines. Opacity with volume loss left base. Mild cardiomegaly distorted by rotation. No pneumothorax. IMPRESSION: 1. Stable hardware positioning. 2. Unchanged retrocardiac infiltrate. Electronically Signed   By: Tiburcio Pea M.D.    On: 04/13/2023 06:13   DG Abd 1 View  Result Date: 04/12/2023 CLINICAL DATA:  Enteric catheter placement EXAM: ABDOMEN - 1 VIEW COMPARISON:  None Available. FINDINGS: Frontal view of the lower chest and upper abdomen demonstrates enteric catheter passing below diaphragm tip projecting over the gastric antrum. There is a relative paucity of bowel gas. Lung bases are clear. IMPRESSION: 1. Enteric catheter tip projecting over the gastric antrum. Electronically Signed   By: Sharlet Salina M.D.   On: 04/12/2023 17:46   IR Transcath/Emboliz  Result Date: 04/12/2023 PROCEDURE: DIAGNOSTIC CEREBRAL ANGIOGRAM COIL EMBOLIZATION OF ANTERIOR COMMUNICATING ARTERY ANEURYSM HISTORY: The patient is a 82 year old woman presenting to the hospital after being found down by her husband. CT scan demonstrated subarachnoid hemorrhage and ventricular hemorrhage. 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 She therefore presents for diagnostic cerebral angiogram and possible aneurysm embolization after external ventricular drain was placed in the ICU. ACCESS: The technical aspects of the procedure as well as its potential risks and benefits were reviewed with the patient's husband and a family friend. These risks included but were not  limited to stroke, intracranial hemorrhage, bleeding, infection, allergic reaction, damage to organs or vital structures, stroke, non-diagnostic procedure, and the catastrophic outcomes of heart attack, coma, and death. With an understanding of these risks, informed consent was obtained and witnessed. The patient was placed in the supine position on the angiography table and the skin of right groin prepped in the usual sterile fashion. The procedure was performed under general anesthesia. A 5-French sheath was introduced in the right common femoral artery using Seldinger technique. MEDICATIONS: HEPARIN: 0 Units total. CONTRAST:  OMNIPAQUE IOHEXOL 300 MG/ML   SOLNcc, Omnipaque 300 FLUOROSCOPY TIME:  FLUOROSCOPY TIME: See IR records TECHNIQUE: CATHETERS AND WIRES 5-French Simmons 2 glide catheter 180 cm 0.035" glidewire 180 cm 0.035 stiff glidewire 6-French NeuronMax guide sheath 6-French Berenstein Select JB-1 catheter 6-French Simmons 2 select catheter 0.058" CatV guidecatheter 150 cm XT 27 microcatheter Synchro 2 select standard microwire Excelsior XT-17 microcatheter COILS USED Target XL 360 soft 8 mm x 30 cm Target XL 360 soft 7 mm x 20 cm Target XL 360 soft 5 mm x 15 cm Target XL 360 soft 3 mm x 9 cm Target XL 360 soft 3 mm x 6 cm X 3 VESSELS CATHETERIZED Right common carotid Left internal carotid Left vertebral Right common femoral Right anterior cerebral artery VESSELS STUDIED Right common carotid, head Left internal carotid, head Left internal carotid, three-dimensional rotational angiogram Left vertebral Left internal carotid artery, head (during embolization) Left internal carotid artery, head (immediate post-embolization) Left internal carotid artery, head (final control) Right common femoral PROCEDURAL NARRATIVE A 5-Fr Simmons 2 glide catheter was advanced over a 0.035 glidewire into the aortic arch. The above vessels were then sequentially catheterized and cervical / cerebral angiograms taken. This included three-dimensional rotational angiogram with the catheter within the left internal carotid artery. After review of images, the catheter was removed without incident. The 5-Fr sheath was then exchanged over the glidewire for an 8-Fr sheath. Under real-time fluoroscopy, the guide sheath was advanced over the [facility] select catheter and glidewire into the descending aorta. Attempt was made to catheterize the left internal carotid artery which has a common origin with the right internal carotid. 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 Due to the arch configuration, I was unable to selectively catheterize the left internal carotid with  a Berenstein catheter. The Gar Ponto was therefore removed and the [facility] 2 catheter was introduced. The secondary curve was reformed over the aortic arch. The left common carotid artery was then selected. Multiple attempts were made to advance the neuron max guide sheath over the [facility] guide catheter and Glidewire. Ultimately, use of the stiff Glidewire placed into the distal cervical internal carotid artery allowed advancement of the neuron max guide sheath into the proximal cervical left internal carotid artery. The Select catheter was removed and the 058 guide catheter was coaxially introduced over the 27 microcatheter and microwire. The microcatheter was then advanced under roadmap guidance into the right A1 segment. The guide catheter was then tracked over the microcatheter to its final position in the proximal cavernous right internal carotid artery. The 27 microcatheter was then removed and the coiling catheter introduced over the microwire. The catheter was then navigated into the aneurysm lumen over the microwire. The wire was then removed. The above coils were then sequentially deployed with progressive exclusion of the aneurysm. Cerebral angiography was performed during and immediately post embolization. The coiling catheter was then removed and the guide catheter withdrawn into the  cervical internal carotid artery. Final control angiography was then performed from the guide catheter. The guide catheter and guide sheath were then synchronously removed without incident. FINDINGS: Right common carotid, head: Injection reveals the presence of a widely patent ICA and MCA as well as all segments and their branches. The right A1 is noted to be hypoplastic. No aneurysms, AVMs, or high-flow fistulas are seen. No vasospasm of the right carotid circulation is seen. The parenchymal and venous phases are normal. The venous sinuses are widely patent. Left internal carotid, head: Injection reveals the presence of  a widely patent ICA, A1, and M1 segments and their branches. The left A1 is noted to be hypoplastic with filling of both A2 segments from this left-sided injection. 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 There is an aneurysms projecting posteriorly and inferiorly arising from the left A1 A2 junction. This aneurysm measures approximately 8 mm craniocaudal, 11 mm medial-lateral, and 8.2 mm antero posterior. Overall morphology is better delineated on the three-dimensional rotational angiogram. The parenchymal and venous phases are normal. The venous sinuses are widely patent. Left internal carotid, 3D rotation 3-dimensional rotational angiographic images were reconstructed on an independent workstation for review. These further delineate the above-described anterior communicating artery aneurysms. Of note, the aneurysms neck appears to be relatively narrow, and essentially distinct from the origin of the bilateral A2 segments. Left vertebral: Injection reveals the presence of a widely patent and dominant left vertebral artery. This leads to a widely patent basilar artery that terminates in right P1, while the left P1 is hypoplastic. The basilar apex is normal. There is no vasospasm of the posterior circulation. No aneurysms, AVMs, or high-flow fistulas are seen. The parenchymal and venous phases are normal. The venous sinuses are widely patent. Left internal carotid artery, head (during embolization): Injection reveals the presence of a widely patent ICA that leads to a patent ACA and MCA. Coil mass within the aneurysm is stable, without coil prolapse or filling defect to suggest thrombus. The distal A2 segments remain patent during embolization. Left internal carotid artery, head (immediate post-embolization): Injection reveals the presence of a widely patent ICA that leads to a patent ACA and MCA. Coil mass within the aneurysm is stable, without coil prolapse or filling defect to suggest  thrombus. There is a small amount of filling of the left lateral portion of the aneurysms, with marked contrast stasis within this segment of the aneurysms. Left internal carotid artery, head (final control): Injection reveals the presence of a widely patent ICA that leads to a patent ACA and MCA. No thrombus is visualized. Coil mass is seen within the aneurysm and is in stable position. No branch occlusions are seen. Capillary phase does not demonstrate any perfusion deficits. Venous sinuses are patent. Right femoral: Normal vessel. No significant atherosclerotic disease. Arterial sheath in adequate position. DISPOSITION: Upon completion of the study, the femoral sheath was removed and hemostasis obtained using a 8-Fr Angio-Seal closure device. Good proximal and distal lower extremity pulses were documented upon achievement of hemostasis. The procedure was well tolerated and no early complications were observed. The patient was transferred to the neuro intensive care unit for further care. IMPRESSION: 1. 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 The preliminary results of this procedure were shared with the patient's family. Electronically Signed   By: Lisbeth Renshaw   On: 04/12/2023 15:55   IR ANGIO VERTEBRAL SEL VERTEBRAL UNI L MOD SED  Result Date: 04/12/2023 PROCEDURE: DIAGNOSTIC CEREBRAL ANGIOGRAM COIL EMBOLIZATION OF ANTERIOR COMMUNICATING ARTERY ANEURYSM HISTORY: The patient is a 82 year old woman presenting to the hospital after being found down by her husband. CT scan demonstrated subarachnoid hemorrhage and ventricular hemorrhage. 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 She therefore presents for diagnostic cerebral angiogram and possible aneurysm embolization after external ventricular drain was placed in the ICU. ACCESS: The technical aspects of the procedure as well as its potential risks and benefits were  reviewed with the patient's husband and a family friend. These risks included but were not limited to stroke, intracranial hemorrhage, bleeding, infection, allergic reaction, damage to organs or vital structures, stroke, non-diagnostic procedure, and the catastrophic outcomes of heart attack, coma, and death. With an understanding of these risks, informed consent was obtained and witnessed. The patient was placed in the supine position on the angiography table and the skin of right groin prepped in the usual sterile fashion. The procedure was performed under general anesthesia. A 5-French sheath was introduced in the right common femoral artery using Seldinger technique. MEDICATIONS: HEPARIN: 0 Units total. CONTRAST:  OMNIPAQUE IOHEXOL 300 MG/ML  SOLNcc, Omnipaque 300 FLUOROSCOPY TIME:  FLUOROSCOPY TIME: See IR records TECHNIQUE: CATHETERS AND WIRES 5-French Simmons 2 glide catheter 180 cm 0.035" glidewire 180 cm 0.035 stiff glidewire 6-French NeuronMax guide sheath 6-French Berenstein Select JB-1 catheter 6-French Simmons 2 select catheter 0.058" CatV guidecatheter 150 cm XT 27 microcatheter Synchro 2 select standard microwire Excelsior XT-17 microcatheter COILS USED Target XL 360 soft 8 mm x 30 cm Target XL 360 soft 7 mm x 20 cm Target XL 360 soft 5 mm x 15 cm Target XL 360 soft 3 mm x 9 cm Target XL 360 soft 3 mm x 6 cm X 3 VESSELS CATHETERIZED Right common carotid Left internal carotid Left vertebral Right common femoral Right anterior cerebral artery VESSELS STUDIED Right common carotid, head Left internal carotid, head Left internal carotid, three-dimensional rotational angiogram Left vertebral Left internal carotid artery, head (during embolization) Left internal carotid artery, head (immediate post-embolization) Left internal carotid artery, head (final control) Right common femoral PROCEDURAL NARRATIVE A 5-Fr Simmons 2 glide catheter was advanced over a 0.035 glidewire into the aortic arch. The above  vessels were then sequentially catheterized and cervical / cerebral angiograms taken. This included three-dimensional rotational angiogram with the catheter within the left internal carotid artery. After review of images, the catheter was removed without incident. The 5-Fr sheath was then exchanged over the glidewire for an 8-Fr sheath. Under real-time fluoroscopy, the guide sheath was advanced over the [facility] select catheter and glidewire into the descending aorta. Attempt was made to catheterize the left internal carotid artery which has a common origin with the right internal carotid. 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 Due to the arch configuration, I was unable to selectively catheterize the left internal carotid with a Berenstein catheter. The Gar Ponto was therefore removed and the [facility] 2 catheter was introduced. The secondary curve was reformed over the aortic arch. The left common carotid artery was then selected. Multiple attempts were made to advance the neuron max guide sheath over the [facility] guide catheter and Glidewire. Ultimately, use of the stiff Glidewire placed into the distal cervical internal carotid artery allowed advancement of the neuron max guide sheath into the proximal cervical left internal carotid artery. The Select catheter was removed and the 058 guide catheter was coaxially introduced over the 27 microcatheter  and microwire. The microcatheter was then advanced under roadmap guidance into the right A1 segment. The guide catheter was then tracked over the microcatheter to its final position in the proximal cavernous right internal carotid artery. The 27 microcatheter was then removed and the coiling catheter introduced over the microwire. The catheter was then navigated into the aneurysm lumen over the microwire. The wire was then removed. The above coils were then sequentially deployed with progressive exclusion of the aneurysm. Cerebral angiography  was performed during and immediately post embolization. The coiling catheter was then removed and the guide catheter withdrawn into the cervical internal carotid artery. Final control angiography was then performed from the guide catheter. The guide catheter and guide sheath were then synchronously removed without incident. FINDINGS: Right common carotid, head: Injection reveals the presence of a widely patent ICA and MCA as well as all segments and their branches. The right A1 is noted to be hypoplastic. No aneurysms, AVMs, or high-flow fistulas are seen. No vasospasm of the right carotid circulation is seen. The parenchymal and venous phases are normal. The venous sinuses are widely patent. Left internal carotid, head: Injection reveals the presence of a widely patent ICA, A1, and M1 segments and their branches. The left A1 is noted to be hypoplastic with filling of both A2 segments from this left-sided injection. 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 There is an aneurysms projecting posteriorly and inferiorly arising from the left A1 A2 junction. This aneurysm measures approximately 8 mm craniocaudal, 11 mm medial-lateral, and 8.2 mm antero posterior. Overall morphology is better delineated on the three-dimensional rotational angiogram. The parenchymal and venous phases are normal. The venous sinuses are widely patent. Left internal carotid, 3D rotation 3-dimensional rotational angiographic images were reconstructed on an independent workstation for review. These further delineate the above-described anterior communicating artery aneurysms. Of note, the aneurysms neck appears to be relatively narrow, and essentially distinct from the origin of the bilateral A2 segments. Left vertebral: Injection reveals the presence of a widely patent and dominant left vertebral artery. This leads to a widely patent basilar artery that terminates in right P1, while the left P1 is hypoplastic. The basilar  apex is normal. There is no vasospasm of the posterior circulation. No aneurysms, AVMs, or high-flow fistulas are seen. The parenchymal and venous phases are normal. The venous sinuses are widely patent. Left internal carotid artery, head (during embolization): Injection reveals the presence of a widely patent ICA that leads to a patent ACA and MCA. Coil mass within the aneurysm is stable, without coil prolapse or filling defect to suggest thrombus. The distal A2 segments remain patent during embolization. Left internal carotid artery, head (immediate post-embolization): Injection reveals the presence of a widely patent ICA that leads to a patent ACA and MCA. Coil mass within the aneurysm is stable, without coil prolapse or filling defect to suggest thrombus. There is a small amount of filling of the left lateral portion of the aneurysms, with marked contrast stasis within this segment of the aneurysms. Left internal carotid artery, head (final control): Injection reveals the presence of a widely patent ICA that leads to a patent ACA and MCA. No thrombus is visualized. Coil mass is seen within the aneurysm and is in stable position. No branch occlusions are seen. Capillary phase does not demonstrate any perfusion deficits. Venous sinuses are patent. Right femoral: Normal vessel. No significant atherosclerotic disease. Arterial sheath in adequate position. DISPOSITION: Upon completion of the study, the femoral  sheath was removed and hemostasis obtained using a 8-Fr Angio-Seal closure device. Good proximal and distal lower extremity pulses were documented upon achievement of hemostasis. The procedure was well tolerated and no early complications were observed. The patient was transferred to the neuro intensive care unit for further care. IMPRESSION: 1. 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 The preliminary results of this procedure were shared with the patient's  family. Electronically Signed   By: Lisbeth Renshaw   On: 04/12/2023 15:55   IR Angiogram Follow Up Study  Result Date: 04/12/2023 PROCEDURE: DIAGNOSTIC CEREBRAL ANGIOGRAM COIL EMBOLIZATION OF ANTERIOR COMMUNICATING ARTERY ANEURYSM HISTORY: The patient is a 82 year old woman presenting to the hospital after being found down by her husband. CT scan demonstrated subarachnoid hemorrhage and ventricular hemorrhage. 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 She therefore presents for diagnostic cerebral angiogram and possible aneurysm embolization after external ventricular drain was placed in the ICU. ACCESS: The technical aspects of the procedure as well as its potential risks and benefits were reviewed with the patient's husband and a family friend. These risks included but were not limited to stroke, intracranial hemorrhage, bleeding, infection, allergic reaction, damage to organs or vital structures, stroke, non-diagnostic procedure, and the catastrophic outcomes of heart attack, coma, and death. With an understanding of these risks, informed consent was obtained and witnessed. The patient was placed in the supine position on the angiography table and the skin of right groin prepped in the usual sterile fashion. The procedure was performed under general anesthesia. A 5-French sheath was introduced in the right common femoral artery using Seldinger technique. MEDICATIONS: HEPARIN: 0 Units total. CONTRAST:  OMNIPAQUE IOHEXOL 300 MG/ML  SOLNcc, Omnipaque 300 FLUOROSCOPY TIME:  FLUOROSCOPY TIME: See IR records TECHNIQUE: CATHETERS AND WIRES 5-French Simmons 2 glide catheter 180 cm 0.035" glidewire 180 cm 0.035 stiff glidewire 6-French NeuronMax guide sheath 6-French Berenstein Select JB-1 catheter 6-French Simmons 2 select catheter 0.058" CatV guidecatheter 150 cm XT 27 microcatheter Synchro 2 select standard microwire Excelsior XT-17 microcatheter COILS USED Target XL 360  soft 8 mm x 30 cm Target XL 360 soft 7 mm x 20 cm Target XL 360 soft 5 mm x 15 cm Target XL 360 soft 3 mm x 9 cm Target XL 360 soft 3 mm x 6 cm X 3 VESSELS CATHETERIZED Right common carotid Left internal carotid Left vertebral Right common femoral Right anterior cerebral artery VESSELS STUDIED Right common carotid, head Left internal carotid, head Left internal carotid, three-dimensional rotational angiogram Left vertebral Left internal carotid artery, head (during embolization) Left internal carotid artery, head (immediate post-embolization) Left internal carotid artery, head (final control) Right common femoral PROCEDURAL NARRATIVE A 5-Fr Simmons 2 glide catheter was advanced over a 0.035 glidewire into the aortic arch. The above vessels were then sequentially catheterized and cervical / cerebral angiograms taken. This included three-dimensional rotational angiogram with the catheter within the left internal carotid artery. After review of images, the catheter was removed without incident. The 5-Fr sheath was then exchanged over the glidewire for an 8-Fr sheath. Under real-time fluoroscopy, the guide sheath was advanced over the [facility] select catheter and glidewire into the descending aorta. Attempt was made to catheterize the left internal carotid artery which has a common origin with the right internal carotid. 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 Due to the arch configuration, I was unable to selectively catheterize the left internal carotid  with a Berenstein catheter. The Gar Ponto was therefore removed and the [facility] 2 catheter was introduced. The secondary curve was reformed over the aortic arch. The left common carotid artery was then selected. Multiple attempts were made to advance the neuron max guide sheath over the [facility] guide catheter and Glidewire. Ultimately, use of the stiff Glidewire placed into the distal cervical internal carotid artery allowed advancement of the  neuron max guide sheath into the proximal cervical left internal carotid artery. The Select catheter was removed and the 058 guide catheter was coaxially introduced over the 27 microcatheter and microwire. The microcatheter was then advanced under roadmap guidance into the right A1 segment. The guide catheter was then tracked over the microcatheter to its final position in the proximal cavernous right internal carotid artery. The 27 microcatheter was then removed and the coiling catheter introduced over the microwire. The catheter was then navigated into the aneurysm lumen over the microwire. The wire was then removed. The above coils were then sequentially deployed with progressive exclusion of the aneurysm. Cerebral angiography was performed during and immediately post embolization. The coiling catheter was then removed and the guide catheter withdrawn into the cervical internal carotid artery. Final control angiography was then performed from the guide catheter. The guide catheter and guide sheath were then synchronously removed without incident. FINDINGS: Right common carotid, head: Injection reveals the presence of a widely patent ICA and MCA as well as all segments and their branches. The right A1 is noted to be hypoplastic. No aneurysms, AVMs, or high-flow fistulas are seen. No vasospasm of the right carotid circulation is seen. The parenchymal and venous phases are normal. The venous sinuses are widely patent. Left internal carotid, head: Injection reveals the presence of a widely patent ICA, A1, and M1 segments and their branches. The left A1 is noted to be hypoplastic with filling of both A2 segments from this left-sided injection. 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 There is an aneurysms projecting posteriorly and inferiorly arising from the left A1 A2 junction. This aneurysm measures approximately 8 mm craniocaudal, 11 mm medial-lateral, and 8.2 mm antero posterior. Overall  morphology is better delineated on the three-dimensional rotational angiogram. The parenchymal and venous phases are normal. The venous sinuses are widely patent. Left internal carotid, 3D rotation 3-dimensional rotational angiographic images were reconstructed on an independent workstation for review. These further delineate the above-described anterior communicating artery aneurysms. Of note, the aneurysms neck appears to be relatively narrow, and essentially distinct from the origin of the bilateral A2 segments. Left vertebral: Injection reveals the presence of a widely patent and dominant left vertebral artery. This leads to a widely patent basilar artery that terminates in right P1, while the left P1 is hypoplastic. The basilar apex is normal. There is no vasospasm of the posterior circulation. No aneurysms, AVMs, or high-flow fistulas are seen. The parenchymal and venous phases are normal. The venous sinuses are widely patent. Left internal carotid artery, head (during embolization): Injection reveals the presence of a widely patent ICA that leads to a patent ACA and MCA. Coil mass within the aneurysm is stable, without coil prolapse or filling defect to suggest thrombus. The distal A2 segments remain patent during embolization. Left internal carotid artery, head (immediate post-embolization): Injection reveals the presence of a widely patent ICA that leads to a patent ACA and MCA. Coil mass within the aneurysm is stable, without coil prolapse or filling defect to suggest thrombus. There is a small amount  of filling of the left lateral portion of the aneurysms, with marked contrast stasis within this segment of the aneurysms. Left internal carotid artery, head (final control): Injection reveals the presence of a widely patent ICA that leads to a patent ACA and MCA. No thrombus is visualized. Coil mass is seen within the aneurysm and is in stable position. No branch occlusions are seen. Capillary phase does not  demonstrate any perfusion deficits. Venous sinuses are patent. Right femoral: Normal vessel. No significant atherosclerotic disease. Arterial sheath in adequate position. DISPOSITION: Upon completion of the study, the femoral sheath was removed and hemostasis obtained using a 8-Fr Angio-Seal closure device. Good proximal and distal lower extremity pulses were documented upon achievement of hemostasis. The procedure was well tolerated and no early complications were observed. The patient was transferred to the neuro intensive care unit for further care. IMPRESSION: 1. 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 The preliminary results of this procedure were shared with the patient's family. Electronically Signed   By: Lisbeth Renshaw   On: 04/12/2023 15:55   IR Angiogram Follow Up Study  Result Date: 04/12/2023 PROCEDURE: DIAGNOSTIC CEREBRAL ANGIOGRAM COIL EMBOLIZATION OF ANTERIOR COMMUNICATING ARTERY ANEURYSM HISTORY: The patient is a 82 year old woman presenting to the hospital after being found down by her husband. CT scan demonstrated subarachnoid hemorrhage and ventricular hemorrhage. 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 She therefore presents for diagnostic cerebral angiogram and possible aneurysm embolization after external ventricular drain was placed in the ICU. ACCESS: The technical aspects of the procedure as well as its potential risks and benefits were reviewed with the patient's husband and a family friend. These risks included but were not limited to stroke, intracranial hemorrhage, bleeding, infection, allergic reaction, damage to organs or vital structures, stroke, non-diagnostic procedure, and the catastrophic outcomes of heart attack, coma, and death. With an understanding of these risks, informed consent was obtained and witnessed. The patient was placed in the supine position on the angiography table  and the skin of right groin prepped in the usual sterile fashion. The procedure was performed under general anesthesia. A 5-French sheath was introduced in the right common femoral artery using Seldinger technique. MEDICATIONS: HEPARIN: 0 Units total. CONTRAST:  OMNIPAQUE IOHEXOL 300 MG/ML  SOLNcc, Omnipaque 300 FLUOROSCOPY TIME:  FLUOROSCOPY TIME: See IR records TECHNIQUE: CATHETERS AND WIRES 5-French Simmons 2 glide catheter 180 cm 0.035" glidewire 180 cm 0.035 stiff glidewire 6-French NeuronMax guide sheath 6-French Berenstein Select JB-1 catheter 6-French Simmons 2 select catheter 0.058" CatV guidecatheter 150 cm XT 27 microcatheter Synchro 2 select standard microwire Excelsior XT-17 microcatheter COILS USED Target XL 360 soft 8 mm x 30 cm Target XL 360 soft 7 mm x 20 cm Target XL 360 soft 5 mm x 15 cm Target XL 360 soft 3 mm x 9 cm Target XL 360 soft 3 mm x 6 cm X 3 VESSELS CATHETERIZED Right common carotid Left internal carotid Left vertebral Right common femoral Right anterior cerebral artery VESSELS STUDIED Right common carotid, head Left internal carotid, head Left internal carotid, three-dimensional rotational angiogram Left vertebral Left internal carotid artery, head (during embolization) Left internal carotid artery, head (immediate post-embolization) Left internal carotid artery, head (final control) Right common femoral PROCEDURAL NARRATIVE A 5-Fr Simmons 2 glide catheter was advanced over a 0.035 glidewire into the aortic arch. The above vessels were then sequentially catheterized and cervical / cerebral angiograms taken. This included three-dimensional rotational  angiogram with the catheter within the left internal carotid artery. After review of images, the catheter was removed without incident. The 5-Fr sheath was then exchanged over the glidewire for an 8-Fr sheath. Under real-time fluoroscopy, the guide sheath was advanced over the [facility] select catheter and glidewire into the  descending aorta. Attempt was made to catheterize the left internal carotid artery which has a common origin with the right internal carotid. 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 Due to the arch configuration, I was unable to selectively catheterize the left internal carotid with a Berenstein catheter. The [place] was therefore removed and the [facility] 2 catheter was introduced. The secondary curve was reformed over the aortic arch. The left common carotid artery was then selected. Multiple attempts were made to advance the neuron max guide sheath over the [facility] guide catheter and Glidewire. Ultimately, use of the stiff Glidewire placed into the distal cervical internal carotid artery allowed advancement of the neuron max guide sheath into the proximal cervical left internal carotid artery. The Select catheter was removed and the 058 guide catheter was coaxially introduced over the 27 microcatheter and microwire. The microcatheter was then advanced under roadmap guidance into the right A1 segment. The guide catheter was then tracked over the microcatheter to its final position in the proximal cavernous right internal carotid artery. The 27 microcatheter was then removed and the coiling catheter introduced over the microwire. The catheter was then navigated into the aneurysm lumen over the microwire. The wire was then removed. The above coils were then sequentially deployed with progressive exclusion of the aneurysm. Cerebral angiography was performed during and immediately post embolization. The coiling catheter was then removed and the guide catheter withdrawn into the cervical internal carotid artery. Final control angiography was then performed from the guide catheter. The guide catheter and guide sheath were then synchronously removed without incident. FINDINGS: Right common carotid, head: Injection reveals the presence of a widely patent ICA and MCA as well as all segments and their  branches. The right A1 is noted to be hypoplastic. No aneurysms, AVMs, or high-flow fistulas are seen. No vasospasm of the right carotid circulation is seen. The parenchymal and venous phases are normal. The venous sinuses are widely patent. Left internal carotid, head: Injection reveals the presence of a widely patent ICA, A1, and M1 segments and their branches. The left A1 is noted to be hypoplastic with filling of both A2 segments from this left-sided injection. 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 There is an aneurysms projecting posteriorly and inferiorly arising from the left A1 A2 junction. This aneurysm measures approximately 8 mm craniocaudal, 11 mm medial-lateral, and 8.2 mm antero posterior. Overall morphology is better delineated on the three-dimensional rotational angiogram. The parenchymal and venous phases are normal. The venous sinuses are widely patent. Left internal carotid, 3D rotation 3-dimensional rotational angiographic images were reconstructed on an independent workstation for review. These further delineate the above-described anterior communicating artery aneurysms. Of note, the aneurysms neck appears to be relatively narrow, and essentially distinct from the origin of the bilateral A2 segments. Left vertebral: Injection reveals the presence of a widely patent and dominant left vertebral artery. This leads to a widely patent basilar artery that terminates in right P1, while the left P1 is hypoplastic. The basilar apex is normal. There is no vasospasm of the posterior circulation. No aneurysms, AVMs, or high-flow fistulas are seen. The parenchymal and venous phases are normal. The  venous sinuses are widely patent. Left internal carotid artery, head (during embolization): Injection reveals the presence of a widely patent ICA that leads to a patent ACA and MCA. Coil mass within the aneurysm is stable, without coil prolapse or filling defect to suggest thrombus. The  distal A2 segments remain patent during embolization. Left internal carotid artery, head (immediate post-embolization): Injection reveals the presence of a widely patent ICA that leads to a patent ACA and MCA. Coil mass within the aneurysm is stable, without coil prolapse or filling defect to suggest thrombus. There is a small amount of filling of the left lateral portion of the aneurysms, with marked contrast stasis within this segment of the aneurysms. Left internal carotid artery, head (final control): Injection reveals the presence of a widely patent ICA that leads to a patent ACA and MCA. No thrombus is visualized. Coil mass is seen within the aneurysm and is in stable position. No branch occlusions are seen. Capillary phase does not demonstrate any perfusion deficits. Venous sinuses are patent. Right femoral: Normal vessel. No significant atherosclerotic disease. Arterial sheath in adequate position. DISPOSITION: Upon completion of the study, the femoral sheath was removed and hemostasis obtained using a 8-Fr Angio-Seal closure device. Good proximal and distal lower extremity pulses were documented upon achievement of hemostasis. The procedure was well tolerated and no early complications were observed. The patient was transferred to the neuro intensive care unit for further care. IMPRESSION: 1. 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 The preliminary results of this procedure were shared with the patient's family. Electronically Signed   By: Lisbeth Renshaw   On: 04/12/2023 15:55   IR Angiogram Follow Up Study  Result Date: 04/12/2023 PROCEDURE: DIAGNOSTIC CEREBRAL ANGIOGRAM COIL EMBOLIZATION OF ANTERIOR COMMUNICATING ARTERY ANEURYSM HISTORY: The patient is a 82 year old woman presenting to the hospital after being found down by her husband. CT scan demonstrated subarachnoid hemorrhage and ventricular hemorrhage. Her CT angiogram did reveal a large  anterior communicating artery aneurysms. She therefore presents for diagnostic cerebral angiogram and possible aneurysm embolization after external ventricular drain was placed in the ICU. ACCESS: The technical aspects of the procedure as well as its potential risks and benefits were reviewed with the patient's husband and a family friend. These risks included but were not limited to stroke, intracranial hemorrhage, bleeding, infection, allergic reaction, damage to organs or vital structures, stroke, non-diagnostic procedure, and the catastrophic outcomes of heart attack, coma, and death. With an understanding of these risks, informed consent was obtained and witnessed. The patient was placed in the supine position on the angiography table and the skin of right groin prepped in the usual sterile fashion. The procedure was performed under general anesthesia. A 5-French sheath was introduced in the right common femoral artery using Seldinger technique. MEDICATIONS: HEPARIN: 0 Units total. CONTRAST:  OMNIPAQUE IOHEXOL 300 MG/ML  SOLNcc, Omnipaque 300 FLUOROSCOPY TIME:  FLUOROSCOPY TIME: See IR records TECHNIQUE: CATHETERS AND WIRES 5-French Simmons 2 glide catheter 180 cm 0.035" glidewire 180 cm 0.035 stiff glidewire 6-French NeuronMax guide sheath 6-French Berenstein Select JB-1 catheter 6-French Simmons 2 select catheter 0.058" CatV guidecatheter 150 cm XT 27 microcatheter Synchro 2 select standard microwire Excelsior XT-17 microcatheter COILS USED Target XL 360 soft 8 mm x 30 cm Target XL 360 soft 7 mm x 20 cm Target XL 360 soft 5 mm x 15 cm Target XL 360 soft 3 mm x 9 cm Target XL 360 soft 3 mm x  6 cm X 3 VESSELS CATHETERIZED Right common carotid Left internal carotid Left vertebral Right common femoral Right anterior cerebral artery VESSELS STUDIED Right common carotid, head Left internal carotid, head Left internal carotid, three-dimensional rotational angiogram Left vertebral Left internal carotid artery,  head (during embolization) Left internal carotid artery, head (immediate post-embolization) Left internal carotid artery, head (final control) Right common femoral PROCEDURAL NARRATIVE A 5-Fr Simmons 2 glide catheter was advanced over a 0.035 glidewire into the aortic arch. The above vessels were then sequentially catheterized and cervical / cerebral angiograms taken. This included three-dimensional rotational angiogram with the catheter within the left internal carotid artery. After review of images, the catheter was removed without incident. The 5-Fr sheath was then exchanged over the glidewire for an 8-Fr sheath. Under real-time fluoroscopy, the guide sheath was advanced over the [facility] select catheter and glidewire into the descending aorta. Attempt was made to catheterize the left internal carotid artery which has a common origin with the right internal carotid. 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 Due to the arch configuration, I was unable to selectively catheterize the left internal carotid with a Berenstein catheter. The Gar [place] was therefore removed and the [facility] 2 catheter was introduced. The secondary curve was reformed over the aortic arch. The left common carotid artery was then selected. Multiple attempts were made to advance the neuron max guide sheath over the [facility] guide catheter and Glidewire. Ultimately, use of the stiff Glidewire placed into the distal cervical internal carotid artery allowed advancement of the neuron max guide sheath into the proximal cervical left internal carotid artery. The Select catheter was removed and the 058 guide catheter was coaxially introduced over the 27 microcatheter and microwire. The microcatheter was then advanced under roadmap guidance into the right A1 segment. The guide catheter was then tracked over the microcatheter to its final position in the proximal cavernous right internal carotid artery. The 27 microcatheter was  then removed and the coiling catheter introduced over the microwire. The catheter was then navigated into the aneurysm lumen over the microwire. The wire was then removed. The above coils were then sequentially deployed with progressive exclusion of the aneurysm. Cerebral angiography was performed during and immediately post embolization. The coiling catheter was then removed and the guide catheter withdrawn into the cervical internal carotid artery. Final control angiography was then performed from the guide catheter. The guide catheter and guide sheath were then synchronously removed without incident. FINDINGS: Right common carotid, head: Injection reveals the presence of a widely patent ICA and MCA as well as all segments and their branches. The right A1 is noted to be hypoplastic. No aneurysms, AVMs, or high-flow fistulas are seen. No vasospasm of the right carotid circulation is seen. The parenchymal and venous phases are normal. The venous sinuses are widely patent. Left internal carotid, head: Injection reveals the presence of a widely patent ICA, A1, and M1 segments and their branches. The left A1 is noted to be hypoplastic with filling of both A2 segments from this left-sided injection. 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 There is an aneurysms projecting posteriorly and inferiorly arising from the left A1 A2 junction. This aneurysm measures approximately 8 mm craniocaudal, 11 mm medial-lateral, and 8.2 mm antero posterior. Overall morphology is better delineated on the three-dimensional rotational angiogram. The parenchymal and venous phases are normal. The venous sinuses are widely patent. Left internal carotid, 3D rotation 3-dimensional rotational angiographic images were reconstructed on  an independent workstation for review. These further delineate the above-described anterior communicating artery aneurysms. Of note, the aneurysms neck appears to be relatively narrow, and  essentially distinct from the origin of the bilateral A2 segments. Left vertebral: Injection reveals the presence of a widely patent and dominant left vertebral artery. This leads to a widely patent basilar artery that terminates in right P1, while the left P1 is hypoplastic. The basilar apex is normal. There is no vasospasm of the posterior circulation. No aneurysms, AVMs, or high-flow fistulas are seen. The parenchymal and venous phases are normal. The venous sinuses are widely patent. Left internal carotid artery, head (during embolization): Injection reveals the presence of a widely patent ICA that leads to a patent ACA and MCA. Coil mass within the aneurysm is stable, without coil prolapse or filling defect to suggest thrombus. The distal A2 segments remain patent during embolization. Left internal carotid artery, head (immediate post-embolization): Injection reveals the presence of a widely patent ICA that leads to a patent ACA and MCA. Coil mass within the aneurysm is stable, without coil prolapse or filling defect to suggest thrombus. There is a small amount of filling of the left lateral portion of the aneurysms, with marked contrast stasis within this segment of the aneurysms. Left internal carotid artery, head (final control): Injection reveals the presence of a widely patent ICA that leads to a patent ACA and MCA. No thrombus is visualized. Coil mass is seen within the aneurysm and is in stable position. No branch occlusions are seen. Capillary phase does not demonstrate any perfusion deficits. Venous sinuses are patent. Right femoral: Normal vessel. No significant atherosclerotic disease. Arterial sheath in adequate position. DISPOSITION: Upon completion of the study, the femoral sheath was removed and hemostasis obtained using a 8-Fr Angio-Seal closure device. Good proximal and distal lower extremity pulses were documented upon achievement of hemostasis. The procedure was well tolerated and no early  complications were observed. The patient was transferred to the neuro intensive care unit for further care. IMPRESSION: 1. 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 The preliminary results of this procedure were shared with the patient's family. Electronically Signed   By: Lisbeth Renshaw   On: 04/12/2023 15:55   IR Angiogram Follow Up Study  Result Date: 04/12/2023 PROCEDURE: DIAGNOSTIC CEREBRAL ANGIOGRAM COIL EMBOLIZATION OF ANTERIOR COMMUNICATING ARTERY ANEURYSM HISTORY: The patient is a 82 year old woman presenting to the hospital after being found down by her husband. CT scan demonstrated subarachnoid hemorrhage and ventricular hemorrhage. 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 She therefore presents for diagnostic cerebral angiogram and possible aneurysm embolization after external ventricular drain was placed in the ICU. ACCESS: The technical aspects of the procedure as well as its potential risks and benefits were reviewed with the patient's husband and a family friend. These risks included but were not limited to stroke, intracranial hemorrhage, bleeding, infection, allergic reaction, damage to organs or vital structures, stroke, non-diagnostic procedure, and the catastrophic outcomes of heart attack, coma, and death. With an understanding of these risks, informed consent was obtained and witnessed. The patient was placed in the supine position on the angiography table and the skin of right groin prepped in the usual sterile fashion. The procedure was performed under general anesthesia. A 5-French sheath was introduced in the right common femoral artery using Seldinger technique. MEDICATIONS: HEPARIN: 0 Units total. CONTRAST:  OMNIPAQUE IOHEXOL 300 MG/ML  SOLNcc, Omnipaque 300 FLUOROSCOPY TIME:  FLUOROSCOPY TIME: See IR records TECHNIQUE: CATHETERS AND WIRES 5-French Simmons 2 glide catheter 180 cm 0.035"  glidewire 180 cm 0.035 stiff glidewire 6-French NeuronMax guide sheath 6-French Berenstein Select JB-1 catheter 6-French Simmons 2 select catheter 0.058" CatV guidecatheter 150 cm XT 27 microcatheter Synchro 2 select standard microwire Excelsior XT-17 microcatheter COILS USED Target XL 360 soft 8 mm x 30 cm Target XL 360 soft 7 mm x 20 cm Target XL 360 soft 5 mm x 15 cm Target XL 360 soft 3 mm x 9 cm Target XL 360 soft 3 mm x 6 cm X 3 VESSELS CATHETERIZED Right common carotid Left internal carotid Left vertebral Right common femoral Right anterior cerebral artery VESSELS STUDIED Right common carotid, head Left internal carotid, head Left internal carotid, three-dimensional rotational angiogram Left vertebral Left internal carotid artery, head (during embolization) Left internal carotid artery, head (immediate post-embolization) Left internal carotid artery, head (final control) Right common femoral PROCEDURAL NARRATIVE A 5-Fr Simmons 2 glide catheter was advanced over a 0.035 glidewire into the aortic arch. The above vessels were then sequentially catheterized and cervical / cerebral angiograms taken. This included three-dimensional rotational angiogram with the catheter within the left internal carotid artery. After review of images, the catheter was removed without incident. The 5-Fr sheath was then exchanged over the glidewire for an 8-Fr sheath. Under real-time fluoroscopy, the guide sheath was advanced over the [facility] select catheter and glidewire into the descending aorta. Attempt was made to catheterize the left internal carotid artery which has a common origin with the right internal carotid. 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 Due to the arch configuration, I was unable to selectively catheterize the left internal carotid with a Berenstein catheter. The Gar Ponto was therefore removed and the [facility] 2 catheter was introduced. The secondary curve was reformed over the aortic  arch. The left common carotid artery was then selected. Multiple attempts were made to advance the neuron max guide sheath over the [facility] guide catheter and Glidewire. Ultimately, use of the stiff Glidewire placed into the distal cervical internal carotid artery allowed advancement of the neuron max guide sheath into the proximal cervical left internal carotid artery. The Select catheter was removed and the 058 guide catheter was coaxially introduced over the 27 microcatheter and microwire. The microcatheter was then advanced under roadmap guidance into the right A1 segment. The guide catheter was then tracked over the microcatheter to its final position in the proximal cavernous right internal carotid artery. The 27 microcatheter was then removed and the coiling catheter introduced over the microwire. The catheter was then navigated into the aneurysm lumen over the microwire. The wire was then removed. The above coils were then sequentially deployed with progressive exclusion of the aneurysm. Cerebral angiography was performed during and immediately post embolization. The coiling catheter was then removed and the guide catheter withdrawn into the cervical internal carotid artery. Final control angiography was then performed from the guide catheter. The guide catheter and guide sheath were then synchronously removed without incident. FINDINGS: Right common carotid, head: Injection reveals the presence of a widely patent ICA and MCA as well as all segments and their branches. The right A1 is noted to be hypoplastic. No aneurysms, AVMs, or high-flow fistulas are seen. No vasospasm of the right carotid circulation is seen. The parenchymal and venous phases are normal. The venous sinuses are widely patent. Left internal carotid, head: Injection reveals the presence of a widely patent ICA, A1, and M1  segments and their branches. The left A1 is noted to be hypoplastic with filling of both A2 segments from this  left-sided injection. 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 There is an aneurysms projecting posteriorly and inferiorly arising from the left A1 A2 junction. This aneurysm measures approximately 8 mm craniocaudal, 11 mm medial-lateral, and 8.2 mm antero posterior. Overall morphology is better delineated on the three-dimensional rotational angiogram. The parenchymal and venous phases are normal. The venous sinuses are widely patent. Left internal carotid, 3D rotation 3-dimensional rotational angiographic images were reconstructed on an independent workstation for review. These further delineate the above-described anterior communicating artery aneurysms. Of note, the aneurysms neck appears to be relatively narrow, and essentially distinct from the origin of the bilateral A2 segments. Left vertebral: Injection reveals the presence of a widely patent and dominant left vertebral artery. This leads to a widely patent basilar artery that terminates in right P1, while the left P1 is hypoplastic. The basilar apex is normal. There is no vasospasm of the posterior circulation. No aneurysms, AVMs, or high-flow fistulas are seen. The parenchymal and venous phases are normal. The venous sinuses are widely patent. Left internal carotid artery, head (during embolization): Injection reveals the presence of a widely patent ICA that leads to a patent ACA and MCA. Coil mass within the aneurysm is stable, without coil prolapse or filling defect to suggest thrombus. The distal A2 segments remain patent during embolization. Left internal carotid artery, head (immediate post-embolization): Injection reveals the presence of a widely patent ICA that leads to a patent ACA and MCA. Coil mass within the aneurysm is stable, without coil prolapse or filling defect to suggest thrombus. There is a small amount of filling of the left lateral portion of the aneurysms, with marked contrast stasis within this segment of the  aneurysms. Left internal carotid artery, head (final control): Injection reveals the presence of a widely patent ICA that leads to a patent ACA and MCA. No thrombus is visualized. Coil mass is seen within the aneurysm and is in stable position. No branch occlusions are seen. Capillary phase does not demonstrate any perfusion deficits. Venous sinuses are patent. Right femoral: Normal vessel. No significant atherosclerotic disease. Arterial sheath in adequate position. DISPOSITION: Upon completion of the study, the femoral sheath was removed and hemostasis obtained using a 8-Fr Angio-Seal closure device. Good proximal and distal lower extremity pulses were documented upon achievement of hemostasis. The procedure was well tolerated and no early complications were observed. The patient was transferred to the neuro intensive care unit for further care. IMPRESSION: 1. 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 The preliminary results of this procedure were shared with the patient's family. Electronically Signed   By: Lisbeth Renshaw   On: 04/12/2023 15:55   IR Angiogram Follow Up Study  Result Date: 04/12/2023 PROCEDURE: DIAGNOSTIC CEREBRAL ANGIOGRAM COIL EMBOLIZATION OF ANTERIOR COMMUNICATING ARTERY ANEURYSM HISTORY: The patient is a 82 year old woman presenting to the hospital after being found down by her husband. CT scan demonstrated subarachnoid hemorrhage and ventricular hemorrhage. 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 She therefore presents for diagnostic cerebral angiogram and possible aneurysm embolization after external ventricular drain was placed in the ICU. ACCESS: The technical aspects of the procedure as well as its potential risks and benefits were reviewed with the patient's husband and a family friend. These risks included but were not limited to stroke, intracranial hemorrhage, bleeding,  infection, allergic  reaction, damage to organs or vital structures, stroke, non-diagnostic procedure, and the catastrophic outcomes of heart attack, coma, and death. With an understanding of these risks, informed consent was obtained and witnessed. The patient was placed in the supine position on the angiography table and the skin of right groin prepped in the usual sterile fashion. The procedure was performed under general anesthesia. A 5-French sheath was introduced in the right common femoral artery using Seldinger technique. MEDICATIONS: HEPARIN: 0 Units total. CONTRAST:  OMNIPAQUE IOHEXOL 300 MG/ML  SOLNcc, Omnipaque 300 FLUOROSCOPY TIME:  FLUOROSCOPY TIME: See IR records TECHNIQUE: CATHETERS AND WIRES 5-French Simmons 2 glide catheter 180 cm 0.035" glidewire 180 cm 0.035 stiff glidewire 6-French NeuronMax guide sheath 6-French Berenstein Select JB-1 catheter 6-French Simmons 2 select catheter 0.058" CatV guidecatheter 150 cm XT 27 microcatheter Synchro 2 select standard microwire Excelsior XT-17 microcatheter COILS USED Target XL 360 soft 8 mm x 30 cm Target XL 360 soft 7 mm x 20 cm Target XL 360 soft 5 mm x 15 cm Target XL 360 soft 3 mm x 9 cm Target XL 360 soft 3 mm x 6 cm X 3 VESSELS CATHETERIZED Right common carotid Left internal carotid Left vertebral Right common femoral Right anterior cerebral artery VESSELS STUDIED Right common carotid, head Left internal carotid, head Left internal carotid, three-dimensional rotational angiogram Left vertebral Left internal carotid artery, head (during embolization) Left internal carotid artery, head (immediate post-embolization) Left internal carotid artery, head (final control) Right common femoral PROCEDURAL NARRATIVE A 5-Fr Simmons 2 glide catheter was advanced over a 0.035 glidewire into the aortic arch. The above vessels were then sequentially catheterized and cervical / cerebral angiograms taken. This included three-dimensional rotational angiogram with the catheter within  the left internal carotid artery. After review of images, the catheter was removed without incident. The 5-Fr sheath was then exchanged over the glidewire for an 8-Fr sheath. Under real-time fluoroscopy, the guide sheath was advanced over the [facility] select catheter and glidewire into the descending aorta. Attempt was made to catheterize the left internal carotid artery which has a common origin with the right internal carotid. 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 Due to the arch configuration, I was unable to selectively catheterize the left internal carotid with a Berenstein catheter. The Gar Ponto was therefore removed and the [facility] 2 catheter was introduced. The secondary curve was reformed over the aortic arch. The left common carotid artery was then selected. Multiple attempts were made to advance the neuron max guide sheath over the [facility] guide catheter and Glidewire. Ultimately, use of the stiff Glidewire placed into the distal cervical internal carotid artery allowed advancement of the neuron max guide sheath into the proximal cervical left internal carotid artery. The Select catheter was removed and the 058 guide catheter was coaxially introduced over the 27 microcatheter and microwire. The microcatheter was then advanced under roadmap guidance into the right A1 segment. The guide catheter was then tracked over the microcatheter to its final position in the proximal cavernous right internal carotid artery. The 27 microcatheter was then removed and the coiling catheter introduced over the microwire. The catheter was then navigated into the aneurysm lumen over the microwire. The wire was then removed. The above coils were then sequentially deployed with progressive exclusion of the aneurysm. Cerebral angiography was performed during and immediately post embolization. The coiling catheter was then removed and the guide catheter withdrawn into the cervical internal carotid  artery. Final  control angiography was then performed from the guide catheter. The guide catheter and guide sheath were then synchronously removed without incident. FINDINGS: Right common carotid, head: Injection reveals the presence of a widely patent ICA and MCA as well as all segments and their branches. The right A1 is noted to be hypoplastic. No aneurysms, AVMs, or high-flow fistulas are seen. No vasospasm of the right carotid circulation is seen. The parenchymal and venous phases are normal. The venous sinuses are widely patent. Left internal carotid, head: Injection reveals the presence of a widely patent ICA, A1, and M1 segments and their branches. The left A1 is noted to be hypoplastic with filling of both A2 segments from this left-sided injection. 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 There is an aneurysms projecting posteriorly and inferiorly arising from the left A1 A2 junction. This aneurysm measures approximately 8 mm craniocaudal, 11 mm medial-lateral, and 8.2 mm antero posterior. Overall morphology is better delineated on the three-dimensional rotational angiogram. The parenchymal and venous phases are normal. The venous sinuses are widely patent. Left internal carotid, 3D rotation 3-dimensional rotational angiographic images were reconstructed on an independent workstation for review. These further delineate the above-described anterior communicating artery aneurysms. Of note, the aneurysms neck appears to be relatively narrow, and essentially distinct from the origin of the bilateral A2 segments. Left vertebral: Injection reveals the presence of a widely patent and dominant left vertebral artery. This leads to a widely patent basilar artery that terminates in right P1, while the left P1 is hypoplastic. The basilar apex is normal. There is no vasospasm of the posterior circulation. No aneurysms, AVMs, or high-flow fistulas are seen. The parenchymal and venous phases are  normal. The venous sinuses are widely patent. Left internal carotid artery, head (during embolization): Injection reveals the presence of a widely patent ICA that leads to a patent ACA and MCA. Coil mass within the aneurysm is stable, without coil prolapse or filling defect to suggest thrombus. The distal A2 segments remain patent during embolization. Left internal carotid artery, head (immediate post-embolization): Injection reveals the presence of a widely patent ICA that leads to a patent ACA and MCA. Coil mass within the aneurysm is stable, without coil prolapse or filling defect to suggest thrombus. There is a small amount of filling of the left lateral portion of the aneurysms, with marked contrast stasis within this segment of the aneurysms. Left internal carotid artery, head (final control): Injection reveals the presence of a widely patent ICA that leads to a patent ACA and MCA. No thrombus is visualized. Coil mass is seen within the aneurysm and is in stable position. No branch occlusions are seen. Capillary phase does not demonstrate any perfusion deficits. Venous sinuses are patent. Right femoral: Normal vessel. No significant atherosclerotic disease. Arterial sheath in adequate position. DISPOSITION: Upon completion of the study, the femoral sheath was removed and hemostasis obtained using a 8-Fr Angio-Seal closure device. Good proximal and distal lower extremity pulses were documented upon achievement of hemostasis. The procedure was well tolerated and no early complications were observed. The patient was transferred to the neuro intensive care unit for further care. IMPRESSION: 1. 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 The preliminary results of this procedure were shared with the patient's family. Electronically Signed   By: Lisbeth Renshaw   On: 04/12/2023 15:55   IR Angiogram Follow Up Study  Result Date: 04/12/2023 PROCEDURE: DIAGNOSTIC  CEREBRAL ANGIOGRAM COIL EMBOLIZATION  OF ANTERIOR COMMUNICATING ARTERY ANEURYSM HISTORY: The patient is a 82 year old woman presenting to the hospital after being found down by her husband. CT scan demonstrated subarachnoid hemorrhage and ventricular hemorrhage. 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 She therefore presents for diagnostic cerebral angiogram and possible aneurysm embolization after external ventricular drain was placed in the ICU. ACCESS: The technical aspects of the procedure as well as its potential risks and benefits were reviewed with the patient's husband and a family friend. These risks included but were not limited to stroke, intracranial hemorrhage, bleeding, infection, allergic reaction, damage to organs or vital structures, stroke, non-diagnostic procedure, and the catastrophic outcomes of heart attack, coma, and death. With an understanding of these risks, informed consent was obtained and witnessed. The patient was placed in the supine position on the angiography table and the skin of right groin prepped in the usual sterile fashion. The procedure was performed under general anesthesia. A 5-French sheath was introduced in the right common femoral artery using Seldinger technique. MEDICATIONS: HEPARIN: 0 Units total. CONTRAST:  OMNIPAQUE IOHEXOL 300 MG/ML  SOLNcc, Omnipaque 300 FLUOROSCOPY TIME:  FLUOROSCOPY TIME: See IR records TECHNIQUE: CATHETERS AND WIRES 5-French Simmons 2 glide catheter 180 cm 0.035" glidewire 180 cm 0.035 stiff glidewire 6-French NeuronMax guide sheath 6-French Berenstein Select JB-1 catheter 6-French Simmons 2 select catheter 0.058" CatV guidecatheter 150 cm XT 27 microcatheter Synchro 2 select standard microwire Excelsior XT-17 microcatheter COILS USED Target XL 360 soft 8 mm x 30 cm Target XL 360 soft 7 mm x 20 cm Target XL 360 soft 5 mm x 15 cm Target XL 360 soft 3 mm x 9 cm Target XL 360 soft 3 mm x 6 cm X 3 VESSELS  CATHETERIZED Right common carotid Left internal carotid Left vertebral Right common femoral Right anterior cerebral artery VESSELS STUDIED Right common carotid, head Left internal carotid, head Left internal carotid, three-dimensional rotational angiogram Left vertebral Left internal carotid artery, head (during embolization) Left internal carotid artery, head (immediate post-embolization) Left internal carotid artery, head (final control) Right common femoral PROCEDURAL NARRATIVE A 5-Fr Simmons 2 glide catheter was advanced over a 0.035 glidewire into the aortic arch. The above vessels were then sequentially catheterized and cervical / cerebral angiograms taken. This included three-dimensional rotational angiogram with the catheter within the left internal carotid artery. After review of images, the catheter was removed without incident. The 5-Fr sheath was then exchanged over the glidewire for an 8-Fr sheath. Under real-time fluoroscopy, the guide sheath was advanced over the [facility] select catheter and glidewire into the descending aorta. Attempt was made to catheterize the left internal carotid artery which has a common origin with the right internal carotid. 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 Due to the arch configuration, I was unable to selectively catheterize the left internal carotid with a Berenstein catheter. The Gar Ponto was therefore removed and the [facility] 2 catheter was introduced. The secondary curve was reformed over the aortic arch. The left common carotid artery was then selected. Multiple attempts were made to advance the neuron max guide sheath over the [facility] guide catheter and Glidewire. Ultimately, use of the stiff Glidewire placed into the distal cervical internal carotid artery allowed advancement of the neuron max guide sheath into the proximal cervical left internal carotid artery. The Select catheter was removed and the 058 guide catheter was coaxially  introduced over the 27 microcatheter and microwire. The microcatheter was then advanced under roadmap  guidance into the right A1 segment. The guide catheter was then tracked over the microcatheter to its final position in the proximal cavernous right internal carotid artery. The 27 microcatheter was then removed and the coiling catheter introduced over the microwire. The catheter was then navigated into the aneurysm lumen over the microwire. The wire was then removed. The above coils were then sequentially deployed with progressive exclusion of the aneurysm. Cerebral angiography was performed during and immediately post embolization. The coiling catheter was then removed and the guide catheter withdrawn into the cervical internal carotid artery. Final control angiography was then performed from the guide catheter. The guide catheter and guide sheath were then synchronously removed without incident. FINDINGS: Right common carotid, head: Injection reveals the presence of a widely patent ICA and MCA as well as all segments and their branches. The right A1 is noted to be hypoplastic. No aneurysms, AVMs, or high-flow fistulas are seen. No vasospasm of the right carotid circulation is seen. The parenchymal and venous phases are normal. The venous sinuses are widely patent. Left internal carotid, head: Injection reveals the presence of a widely patent ICA, A1, and M1 segments and their branches. The left A1 is noted to be hypoplastic with filling of both A2 segments from this left-sided injection. 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 There is an aneurysms projecting posteriorly and inferiorly arising from the left A1 A2 junction. This aneurysm measures approximately 8 mm craniocaudal, 11 mm medial-lateral, and 8.2 mm antero posterior. Overall morphology is better delineated on the three-dimensional rotational angiogram. The parenchymal and venous phases are normal. The venous sinuses are widely  patent. Left internal carotid, 3D rotation 3-dimensional rotational angiographic images were reconstructed on an independent workstation for review. These further delineate the above-described anterior communicating artery aneurysms. Of note, the aneurysms neck appears to be relatively narrow, and essentially distinct from the origin of the bilateral A2 segments. Left vertebral: Injection reveals the presence of a widely patent and dominant left vertebral artery. This leads to a widely patent basilar artery that terminates in right P1, while the left P1 is hypoplastic. The basilar apex is normal. There is no vasospasm of the posterior circulation. No aneurysms, AVMs, or high-flow fistulas are seen. The parenchymal and venous phases are normal. The venous sinuses are widely patent. Left internal carotid artery, head (during embolization): Injection reveals the presence of a widely patent ICA that leads to a patent ACA and MCA. Coil mass within the aneurysm is stable, without coil prolapse or filling defect to suggest thrombus. The distal A2 segments remain patent during embolization. Left internal carotid artery, head (immediate post-embolization): Injection reveals the presence of a widely patent ICA that leads to a patent ACA and MCA. Coil mass within the aneurysm is stable, without coil prolapse or filling defect to suggest thrombus. There is a small amount of filling of the left lateral portion of the aneurysms, with marked contrast stasis within this segment of the aneurysms. Left internal carotid artery, head (final control): Injection reveals the presence of a widely patent ICA that leads to a patent ACA and MCA. No thrombus is visualized. Coil mass is seen within the aneurysm and is in stable position. No branch occlusions are seen. Capillary phase does not demonstrate any perfusion deficits. Venous sinuses are patent. Right femoral: Normal vessel. No significant atherosclerotic disease. Arterial sheath in  adequate position. DISPOSITION: Upon completion of the study, the femoral sheath was removed and hemostasis obtained using a 8-Fr  Angio-Seal closure device. Good proximal and distal lower extremity pulses were documented upon achievement of hemostasis. The procedure was well tolerated and no early complications were observed. The patient was transferred to the neuro intensive care unit for further care. IMPRESSION: 1. 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 The preliminary results of this procedure were shared with the patient's family. Electronically Signed   By: Lisbeth Renshaw   On: 04/12/2023 15:55   IR Angiogram Follow Up Study  Result Date: 04/12/2023 PROCEDURE: DIAGNOSTIC CEREBRAL ANGIOGRAM COIL EMBOLIZATION OF ANTERIOR COMMUNICATING ARTERY ANEURYSM HISTORY: The patient is a 82 year old woman presenting to the hospital after being found down by her husband. CT scan demonstrated subarachnoid hemorrhage and ventricular hemorrhage. 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 She therefore presents for diagnostic cerebral angiogram and possible aneurysm embolization after external ventricular drain was placed in the ICU. ACCESS: The technical aspects of the procedure as well as its potential risks and benefits were reviewed with the patient's husband and a family friend. These risks included but were not limited to stroke, intracranial hemorrhage, bleeding, infection, allergic reaction, damage to organs or vital structures, stroke, non-diagnostic procedure, and the catastrophic outcomes of heart attack, coma, and death. With an understanding of these risks, informed consent was obtained and witnessed. The patient was placed in the supine position on the angiography table and the skin of right groin prepped in the usual sterile fashion. The procedure was performed under general anesthesia. A 5-French sheath was introduced  in the right common femoral artery using Seldinger technique. MEDICATIONS: HEPARIN: 0 Units total. CONTRAST:  OMNIPAQUE IOHEXOL 300 MG/ML  SOLNcc, Omnipaque 300 FLUOROSCOPY TIME:  FLUOROSCOPY TIME: See IR records TECHNIQUE: CATHETERS AND WIRES 5-French Simmons 2 glide catheter 180 cm 0.035" glidewire 180 cm 0.035 stiff glidewire 6-French NeuronMax guide sheath 6-French Berenstein Select JB-1 catheter 6-French Simmons 2 select catheter 0.058" CatV guidecatheter 150 cm XT 27 microcatheter Synchro 2 select standard microwire Excelsior XT-17 microcatheter COILS USED Target XL 360 soft 8 mm x 30 cm Target XL 360 soft 7 mm x 20 cm Target XL 360 soft 5 mm x 15 cm Target XL 360 soft 3 mm x 9 cm Target XL 360 soft 3 mm x 6 cm X 3 VESSELS CATHETERIZED Right common carotid Left internal carotid Left vertebral Right common femoral Right anterior cerebral artery VESSELS STUDIED Right common carotid, head Left internal carotid, head Left internal carotid, three-dimensional rotational angiogram Left vertebral Left internal carotid artery, head (during embolization) Left internal carotid artery, head (immediate post-embolization) Left internal carotid artery, head (final control) Right common femoral PROCEDURAL NARRATIVE A 5-Fr Simmons 2 glide catheter was advanced over a 0.035 glidewire into the aortic arch. The above vessels were then sequentially catheterized and cervical / cerebral angiograms taken. This included three-dimensional rotational angiogram with the catheter within the left internal carotid artery. After review of images, the catheter was removed without incident. The 5-Fr sheath was then exchanged over the glidewire for an 8-Fr sheath. Under real-time fluoroscopy, the guide sheath was advanced over the [facility] select catheter and glidewire into the descending aorta. Attempt was made to catheterize the left internal carotid artery which has a common origin with the right internal carotid. 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 Due to the arch configuration, I was unable to selectively catheterize the left internal carotid with a Berenstein catheter. The Gar P[place] was therefore removed  and the [facility] 2 catheter was introduced. The secondary curve was reformed over the aortic arch. The left common carotid artery was then selected. Multiple attempts were made to advance the neuron max guide sheath over the [facility] guide catheter and Glidewire. Ultimately, use of the stiff Glidewire placed into the distal cervical internal carotid artery allowed advancement of the neuron max guide sheath into the proximal cervical left internal carotid artery. The Select catheter was removed and the 058 guide catheter was coaxially introduced over the 27 microcatheter and microwire. The microcatheter was then advanced under roadmap guidance into the right A1 segment. The guide catheter was then tracked over the microcatheter to its final position in the proximal cavernous right internal carotid artery. The 27 microcatheter was then removed and the coiling catheter introduced over the microwire. The catheter was then navigated into the aneurysm lumen over the microwire. The wire was then removed. The above coils were then sequentially deployed with progressive exclusion of the aneurysm. Cerebral angiography was performed during and immediately post embolization. The coiling catheter was then removed and the guide catheter withdrawn into the cervical internal carotid artery. Final control angiography was then performed from the guide catheter. The guide catheter and guide sheath were then synchronously removed without incident. FINDINGS: Right common carotid, head: Injection reveals the presence of a widely patent ICA and MCA as well as all segments and their branches. The right A1 is noted to be hypoplastic. No aneurysms, AVMs, or high-flow fistulas are seen. No vasospasm of the right carotid circulation is seen.  The parenchymal and venous phases are normal. The venous sinuses are widely patent. Left internal carotid, head: Injection reveals the presence of a widely patent ICA, A1, and M1 segments and their branches. The left A1 is noted to be hypoplastic with filling of both A2 segments from this left-sided injection. 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 There is an aneurysms projecting posteriorly and inferiorly arising from the left A1 A2 junction. This aneurysm measures approximately 8 mm craniocaudal, 11 mm medial-lateral, and 8.2 mm antero posterior. Overall morphology is better delineated on the three-dimensional rotational angiogram. The parenchymal and venous phases are normal. The venous sinuses are widely patent. Left internal carotid, 3D rotation 3-dimensional rotational angiographic images were reconstructed on an independent workstation for review. These further delineate the above-described anterior communicating artery aneurysms. Of note, the aneurysms neck appears to be relatively narrow, and essentially distinct from the origin of the bilateral A2 segments. Left vertebral: Injection reveals the presence of a widely patent and dominant left vertebral artery. This leads to a widely patent basilar artery that terminates in right P1, while the left P1 is hypoplastic. The basilar apex is normal. There is no vasospasm of the posterior circulation. No aneurysms, AVMs, or high-flow fistulas are seen. The parenchymal and venous phases are normal. The venous sinuses are widely patent. Left internal carotid artery, head (during embolization): Injection reveals the presence of a widely patent ICA that leads to a patent ACA and MCA. Coil mass within the aneurysm is stable, without coil prolapse or filling defect to suggest thrombus. The distal A2 segments remain patent during embolization. Left internal carotid artery, head (immediate post-embolization): Injection reveals the presence of a  widely patent ICA that leads to a patent ACA and MCA. Coil mass within the aneurysm is stable, without coil prolapse or filling defect to suggest thrombus. There is a small amount of filling of the left lateral portion of the  aneurysms, with marked contrast stasis within this segment of the aneurysms. Left internal carotid artery, head (final control): Injection reveals the presence of a widely patent ICA that leads to a patent ACA and MCA. No thrombus is visualized. Coil mass is seen within the aneurysm and is in stable position. No branch occlusions are seen. Capillary phase does not demonstrate any perfusion deficits. Venous sinuses are patent. Right femoral: Normal vessel. No significant atherosclerotic disease. Arterial sheath in adequate position. DISPOSITION: Upon completion of the study, the femoral sheath was removed and hemostasis obtained using a 8-Fr Angio-Seal closure device. Good proximal and distal lower extremity pulses were documented upon achievement of hemostasis. The procedure was well tolerated and no early complications were observed. The patient was transferred to the neuro intensive care unit for further care. IMPRESSION: 1. 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 The preliminary results of this procedure were shared with the patient's family. Electronically Signed   By: Lisbeth Renshaw   On: 04/12/2023 15:55   IR 3D Independent Annabell Sabal  Result Date: 04/12/2023 PROCEDURE: DIAGNOSTIC CEREBRAL ANGIOGRAM COIL EMBOLIZATION OF ANTERIOR COMMUNICATING ARTERY ANEURYSM HISTORY: The patient is a 82 year old woman presenting to the hospital after being found down by her husband. CT scan demonstrated subarachnoid hemorrhage and ventricular hemorrhage. 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 She therefore presents for diagnostic cerebral angiogram and possible aneurysm embolization after external ventricular  drain was placed in the ICU. ACCESS: The technical aspects of the procedure as well as its potential risks and benefits were reviewed with the patient's husband and a family friend. These risks included but were not limited to stroke, intracranial hemorrhage, bleeding, infection, allergic reaction, damage to organs or vital structures, stroke, non-diagnostic procedure, and the catastrophic outcomes of heart attack, coma, and death. With an understanding of these risks, informed consent was obtained and witnessed. The patient was placed in the supine position on the angiography table and the skin of right groin prepped in the usual sterile fashion. The procedure was performed under general anesthesia. A 5-French sheath was introduced in the right common femoral artery using Seldinger technique. MEDICATIONS: HEPARIN: 0 Units total. CONTRAST:  OMNIPAQUE IOHEXOL 300 MG/ML  SOLNcc, Omnipaque 300 FLUOROSCOPY TIME:  FLUOROSCOPY TIME: See IR records TECHNIQUE: CATHETERS AND WIRES 5-French Simmons 2 glide catheter 180 cm 0.035" glidewire 180 cm 0.035 stiff glidewire 6-French NeuronMax guide sheath 6-French Berenstein Select JB-1 catheter 6-French Simmons 2 select catheter 0.058" CatV guidecatheter 150 cm XT 27 microcatheter Synchro 2 select standard microwire Excelsior XT-17 microcatheter COILS USED Target XL 360 soft 8 mm x 30 cm Target XL 360 soft 7 mm x 20 cm Target XL 360 soft 5 mm x 15 cm Target XL 360 soft 3 mm x 9 cm Target XL 360 soft 3 mm x 6 cm X 3 VESSELS CATHETERIZED Right common carotid Left internal carotid Left vertebral Right common femoral Right anterior cerebral artery VESSELS STUDIED Right common carotid, head Left internal carotid, head Left internal carotid, three-dimensional rotational angiogram Left vertebral Left internal carotid artery, head (during embolization) Left internal carotid artery, head (immediate post-embolization) Left internal carotid artery, head (final control) Right common  femoral PROCEDURAL NARRATIVE A 5-Fr Simmons 2 glide catheter was advanced over a 0.035 glidewire into the aortic arch. The above vessels were then sequentially catheterized and cervical / cerebral angiograms taken. This included three-dimensional rotational angiogram with the catheter within the left internal carotid artery.  After review of images, the catheter was removed without incident. The 5-Fr sheath was then exchanged over the glidewire for an 8-Fr sheath. Under real-time fluoroscopy, the guide sheath was advanced over the [facility] select catheter and glidewire into the descending aorta. Attempt was made to catheterize the left internal carotid artery which has a common origin with the right internal carotid. 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 Due to the arch configuration, I was unable to selectively catheterize the left internal carotid with a Berenstein catheter. The Gar Ponto was therefore removed and the [facility] 2 catheter was introduced. The secondary curve was reformed over the aortic arch. The left common carotid artery was then selected. Multiple attempts were made to advance the neuron max guide sheath over the [facility] guide catheter and Glidewire. Ultimately, use of the stiff Glidewire placed into the distal cervical internal carotid artery allowed advancement of the neuron max guide sheath into the proximal cervical left internal carotid artery. The Select catheter was removed and the 058 guide catheter was coaxially introduced over the 27 microcatheter and microwire. The microcatheter was then advanced under roadmap guidance into the right A1 segment. The guide catheter was then tracked over the microcatheter to its final position in the proximal cavernous right internal carotid artery. The 27 microcatheter was then removed and the coiling catheter introduced over the microwire. The catheter was then navigated into the aneurysm lumen over the microwire. The wire was  then removed. The above coils were then sequentially deployed with progressive exclusion of the aneurysm. Cerebral angiography was performed during and immediately post embolization. The coiling catheter was then removed and the guide catheter withdrawn into the cervical internal carotid artery. Final control angiography was then performed from the guide catheter. The guide catheter and guide sheath were then synchronously removed without incident. FINDINGS: Right common carotid, head: Injection reveals the presence of a widely patent ICA and MCA as well as all segments and their branches. The right A1 is noted to be hypoplastic. No aneurysms, AVMs, or high-flow fistulas are seen. No vasospasm of the right carotid circulation is seen. The parenchymal and venous phases are normal. The venous sinuses are widely patent. Left internal carotid, head: Injection reveals the presence of a widely patent ICA, A1, and M1 segments and their branches. The left A1 is noted to be hypoplastic with filling of both A2 segments from this left-sided injection. 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 There is an aneurysms projecting posteriorly and inferiorly arising from the left A1 A2 junction. This aneurysm measures approximately 8 mm craniocaudal, 11 mm medial-lateral, and 8.2 mm antero posterior. Overall morphology is better delineated on the three-dimensional rotational angiogram. The parenchymal and venous phases are normal. The venous sinuses are widely patent. Left internal carotid, 3D rotation 3-dimensional rotational angiographic images were reconstructed on an independent workstation for review. These further delineate the above-described anterior communicating artery aneurysms. Of note, the aneurysms neck appears to be relatively narrow, and essentially distinct from the origin of the bilateral A2 segments. Left vertebral: Injection reveals the presence of a widely patent and dominant left vertebral  artery. This leads to a widely patent basilar artery that terminates in right P1, while the left P1 is hypoplastic. The basilar apex is normal. There is no vasospasm of the posterior circulation. No aneurysms, AVMs, or high-flow fistulas are seen. The parenchymal and venous phases are normal. The venous sinuses are widely patent. Left internal carotid artery, head (  during embolization): Injection reveals the presence of a widely patent ICA that leads to a patent ACA and MCA. Coil mass within the aneurysm is stable, without coil prolapse or filling defect to suggest thrombus. The distal A2 segments remain patent during embolization. Left internal carotid artery, head (immediate post-embolization): Injection reveals the presence of a widely patent ICA that leads to a patent ACA and MCA. Coil mass within the aneurysm is stable, without coil prolapse or filling defect to suggest thrombus. There is a small amount of filling of the left lateral portion of the aneurysms, with marked contrast stasis within this segment of the aneurysms. Left internal carotid artery, head (final control): Injection reveals the presence of a widely patent ICA that leads to a patent ACA and MCA. No thrombus is visualized. Coil mass is seen within the aneurysm and is in stable position. No branch occlusions are seen. Capillary phase does not demonstrate any perfusion deficits. Venous sinuses are patent. Right femoral: Normal vessel. No significant atherosclerotic disease. Arterial sheath in adequate position. DISPOSITION: Upon completion of the study, the femoral sheath was removed and hemostasis obtained using a 8-Fr Angio-Seal closure device. Good proximal and distal lower extremity pulses were documented upon achievement of hemostasis. The procedure was well tolerated and no early complications were observed. The patient was transferred to the neuro intensive care unit for further care. IMPRESSION: 1. Successful coil embolization of a  ruptured left A1 A2 junction aneurysm, as described above. The preliminary results of this procedure were shared with the patient's family. Electronically Signed   By: Lisbeth Renshaw   On: 04/12/2023 15:55   DG Chest Port 1 View  Result Date: 04/12/2023 CLINICAL DATA:  Post intubation. EXAM: PORTABLE CHEST 1 VIEW COMPARISON:  None Available. FINDINGS: Endotracheal tube is 2.2 cm above the carina. Nasogastric tube extends into the abdomen but the tip is beyond the image. Retrocardiac densities could represent some atelectasis or focal consolidation. Upper lungs are clear. Negative for a pneumothorax. Heart size is upper limits of normal. Atherosclerotic calcifications at the aortic arch. IMPRESSION: 1. Endotracheal tube is appropriately positioned. 2. Retrocardiac densities could represent atelectasis or focal consolidation. Electronically Signed   By: Richarda Overlie M.D.   On: 04/12/2023 11:41   CT C-SPINE NO CHARGE  Result Date: 04/12/2023 CLINICAL DATA:  Provided history: Patient found minimally responsive, pupil sluggish, sonorous breathing, conduit, minimal movement of left upper extremity with painful stimulation (otherwise no localization), no active movement of extremities. EXAM: CT CERVICAL SPINE WITHOUT CONTRAST TECHNIQUE: Multidetector CT imaging of the cervical spine was performed without intravenous contrast. Multiplanar CT image reconstructions were also generated. RADIATION DOSE REDUCTION: This exam was performed according to the departmental dose-optimization program which includes automated exposure control, adjustment of the mA and/or kV according to patient size and/or use of iterative reconstruction technique. COMPARISON:  None. FINDINGS: Alignment: Levocurvature of the cervical spine. Slight grade 1 anterolisthesis at C2-C3 and C3-C4. Skull base and vertebrae: The basion-dental and atlanto-dental intervals are maintained.No evidence of acute fracture to the cervical spine. Soft tissues  and spinal canal: No prevertebral fluid or swelling. No visible canal hematoma. Disc levels: Cervical spondylosis with multilevel disc space narrowing, disc bulges/central disc protrusions, endplate spurring, uncovertebral hypertrophy and facet arthrosis. Disc space narrowing is greatest at C4-C5 and C5-C6 (moderate-to-advanced at these levels). No appreciable high-grade spinal canal stenosis. Multilevel bony neural foraminal narrowing. Upper chest: Mild patchy atelectasis within the imaged left upper lobe. No visible pneumothorax IMPRESSION: 1. No evidence of  an acute cervical spine fracture. 2. Levocurvature of the cervical spine. 3. Mild grade 1 anterolisthesis at C2-C3 and C3-C4. 4. Cervical spondylosis as described. Electronically Signed   By: Jackey Loge D.O.   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 Electronically Signed   By: Jackey Loge D.O.   On: 04/12/2023 11:27   Result Date: 04/12/2023 CLINICAL DATA:  Code stroke. Neuro deficit, acute, stroke suspected. EXAM: CT ANGIOGRAPHY HEAD AND NECK TECHNIQUE: Multidetector CT imaging of the head and neck was performed using the standard protocol during bolus administration of intravenous contrast. Multiplanar CT image reconstructions and MIPs were obtained to evaluate the vascular anatomy. Carotid stenosis measurements (when applicable) are obtained utilizing NASCET criteria, using the distal internal carotid diameter as the denominator. RADIATION DOSE REDUCTION: This exam was performed according to the departmental dose-optimization program which includes automated exposure control, adjustment of the mA and/or kV according to patient size and/or use of iterative reconstruction technique. CONTRAST:  75mL OMNIPAQUE IOHEXOL 350 MG/ML SOLN COMPARISON:  None FINDINGS: CT HEAD FINDINGS Brain: Large-volume acute hemorrhage  within the lateral, third and fourth ventricles. Ventricular prominence consistent with hydrocephalus. Hypodensity surrounding both lateral ventricles, which at least partly reflects chronic small vessel ischemic disease. However, a superimposed component of transependymal interstitial edema may be present. Multifocal acute subarachnoid hemorrhage, most notably within the interhemispheric fissure anteriorly, within the left greater than right MCA cisterns, suprasellar cistern, interpeduncular cistern and prepontine and premedullary cisterns. Patchy and ill-defined hypoattenuation within the cerebral white matter, nonspecific but compatible with moderate chronic small vessel ischemic disease. No demarcated cortical infarct. No evidence of an intracranial mass. No midline shift. Vascular: No hyperdense vessel. Atherosclerotic calcifications. Skull: No calvarial fracture. 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 Sinuses/Orbits: No mass or acute finding within the imaged orbits. 12 mm mucous retention cyst within the left maxillary sinus. Review of the MIP images confirms the above findings These results were called by telephone at the time of interpretation on 04/12/2023 at 10:20 am to provider Dr. Derry Lory, who verbally acknowledged these results. CTA NECK FINDINGS Aortic arch: Common origin of the right and left common carotid arteries. Plaque within the visualized aortic arch and proximal major branch vessels of the neck. No hemodynamically significant innominate or proximal subclavian artery stenosis. Right carotid system: CCA and ICA patent within the neck without stenosis. Mild atherosclerotic plaque about the carotid bifurcation. Left carotid system: CCA and ICA patent within the neck without stenosis. Not sclerotic plaque within the distal CCA and about the carotid bifurcation. Tortuosity of the cervical ICA. Vertebral arteries: Streak/beam  hardening artifact arising from venous reflux of contrast partially obscures the right vertebral artery V1 and distal V2 segments. Within this limitation, the vertebral arteries are patent within the neck without stenosis. The left vertebral artery is dominant. Skeleton: Levocurvature of the cervical spine. Cervical spondylosis. No acute fracture or aggressive osseous lesion. Other neck: No neck mass or cervical lymphadenopathy. Upper chest: Minimal patchy atelectasis within imaged portions of the left upper lobe Review of the MIP images confirms the above findings CTA HEAD FINDINGS Anterior circulation: The intracranial internal carotid arteries are patent. Nonstenotic atherosclerotic plaque within both vessels. The M1 middle cerebral arteries are patent. No M2 proximal branch occlusion or high-grade proximal stenosis. The anterior cerebral arteries are patent. The right anterior cerebral  artery A1 segment is markedly hypoplastic or absent. 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 Posterior circulation: The intracranial vertebral arteries are patent. The basilar artery is patent. The posterior cerebral arteries are patent. The left PCA is fetal in origin. A small right posterior communicating artery is present. Venous sinuses: Within the limitations of contrast timing, no convincing thrombus. Anatomic variants: As described. Review of the MIP images confirms the above findings CTA head impression #1 called by telephone at the time of interpretation on 04/12/2023 at 10:20 am to provider Dr. Derry Lory, who verbally acknowledged these results. IMPRESSION: Non-contrast head CT: 1. Large-volume acute hemorrhage within the lateral, third and fourth ventricles. Associated hydrocephalus with possible transependymal interstitial edema. 2. Multifocal acute subarachnoid hemorrhage, greatest within the interhemispheric fissure anteriorly, within the left greater than right MCA cisterns, suprasellar cistern,  interpeduncular cistern and prepontine and premedullary cisterns. 3. Moderate chronic small vessel ischemic changes within the cerebral white matter. CTA neck: 1. The common carotid and internal carotid arteries are patent within the neck without stenosis. Mild atherosclerotic plaque bilaterally, as described. 2. Streak/beam hardening artifact arising from venous reflux of contrast obscures portions of the right vertebral artery V1 and distal V2 segments. Within this limitation, the vertebral arteries are patent within the neck without stenosis. 3. Aortic Atherosclerosis (ICD10-I70.0). CTA head: 1. 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 2. No intracranial large vessel occlusion or proximal high-grade arterial stenosis. Electronically Signed: By: Jackey Loge D.O. On: 04/12/2023 10:54   CT ANGIO HEAD NECK W WO CM (CODE STROKE)  Addendum Date: 04/12/2023   ADDENDUM REPORT: 04/12/2023 11:27 ADDENDUM: Aberrant right subclavian artery (anatomic variant). Electronically Signed   By: Jackey Loge D.O.   On: 04/12/2023 11:27   Result Date: 04/12/2023 CLINICAL DATA:  Code stroke. Neuro deficit, acute, stroke suspected. EXAM: CT ANGIOGRAPHY HEAD AND NECK TECHNIQUE: Multidetector CT imaging of the head and neck was performed using the standard protocol during bolus administration of intravenous contrast. Multiplanar CT image reconstructions and MIPs were obtained to evaluate the vascular anatomy. Carotid stenosis measurements (when applicable) are obtained utilizing NASCET criteria, using the distal internal carotid diameter as the denominator. RADIATION DOSE REDUCTION: This exam was performed according to the departmental dose-optimization program which includes automated exposure control, adjustment of the mA and/or kV according to patient size and/or use of iterative reconstruction technique. CONTRAST:  75mL OMNIPAQUE  IOHEXOL 350 MG/ML SOLN COMPARISON:  None FINDINGS: CT HEAD FINDINGS Brain: Large-volume acute hemorrhage within the lateral, third and fourth ventricles. Ventricular prominence consistent with hydrocephalus. Hypodensity surrounding both lateral ventricles, which at least partly reflects chronic small vessel ischemic disease. However, a superimposed component of transependymal interstitial edema may be present. Multifocal acute subarachnoid hemorrhage, most notably within the interhemispheric fissure anteriorly, within the left greater than right MCA cisterns, suprasellar cistern, interpeduncular cistern and prepontine and premedullary cisterns. Patchy and ill-defined hypoattenuation within the cerebral white matter, nonspecific but compatible with moderate chronic small vessel ischemic disease. No demarcated cortical infarct. No evidence of an intracranial mass. No midline shift. Vascular: No hyperdense vessel. Atherosclerotic calcifications. Skull: No calvarial fracture. 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 Sinuses/Orbits: No mass or acute finding within the imaged orbits. 12 mm mucous retention cyst within the left maxillary sinus. Review of the MIP images confirms the above findings These results were called by telephone at the  time of interpretation on 04/12/2023 at 10:20 am to provider Dr. Derry Lory, who verbally acknowledged these results. CTA NECK FINDINGS Aortic arch: Common origin of the right and left common carotid arteries. Plaque within the visualized aortic arch and proximal major branch vessels of the neck. No hemodynamically significant innominate or proximal subclavian artery stenosis. Right carotid system: CCA and ICA patent within the neck without stenosis. Mild atherosclerotic plaque about the carotid bifurcation. Left carotid system: CCA and ICA patent within the neck without stenosis. Not sclerotic plaque within the distal CCA  and about the carotid bifurcation. Tortuosity of the cervical ICA. Vertebral arteries: Streak/beam hardening artifact arising from venous reflux of contrast partially obscures the right vertebral artery V1 and distal V2 segments. Within this limitation, the vertebral arteries are patent within the neck without stenosis. The left vertebral artery is dominant. Skeleton: Levocurvature of the cervical spine. Cervical spondylosis. No acute fracture or aggressive osseous lesion. Other neck: No neck mass or cervical lymphadenopathy. Upper chest: Minimal patchy atelectasis within imaged portions of the left upper lobe Review of the MIP images confirms the above findings CTA HEAD FINDINGS Anterior circulation: The intracranial internal carotid arteries are patent. Nonstenotic atherosclerotic plaque within both vessels. The M1 middle cerebral arteries are patent. No M2 proximal branch occlusion or high-grade proximal stenosis. The anterior cerebral arteries are patent. The right anterior cerebral artery A1 segment is markedly hypoplastic or absent. 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 Posterior circulation: The intracranial vertebral arteries are patent. The basilar artery is patent. The posterior cerebral arteries are patent. The left PCA is fetal in origin. A small right posterior communicating artery is present. Venous sinuses: Within the limitations of contrast timing, no convincing thrombus. Anatomic variants: As described. Review of the MIP images confirms the above findings CTA head impression #1 called by telephone at the time of interpretation on 04/12/2023 at 10:20 am to provider Dr. Derry Lory, who verbally acknowledged these results. IMPRESSION: Non-contrast head CT: 1. Large-volume acute hemorrhage within the lateral, third and fourth ventricles. Associated hydrocephalus with possible transependymal interstitial edema. 2. Multifocal acute subarachnoid hemorrhage, greatest within the  interhemispheric fissure anteriorly, within the left greater than right MCA cisterns, suprasellar cistern, interpeduncular cistern and prepontine and premedullary cisterns. 3. Moderate chronic small vessel ischemic changes within the cerebral white matter. CTA neck: 1. The common carotid and internal carotid arteries are patent within the neck without stenosis. Mild atherosclerotic plaque bilaterally, as described. 2. Streak/beam hardening artifact arising from venous reflux of contrast obscures portions of the right vertebral artery V1 and distal V2 segments. Within this limitation, the vertebral arteries are patent within the neck without stenosis. 3. Aortic Atherosclerosis (ICD10-I70.0). CTA head: 1. 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 2. No intracranial large vessel occlusion or proximal high-grade arterial stenosis. Electronically Signed: By: Jackey Loge D.O. 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

## 2023-04-13 NOTE — Progress Notes (Signed)
PT Cancellation Note  Patient Details Name: PERCIE MOUNTFORD MRN: [id number] DOB: 1941/08/07   Cancelled Treatment:    Reason Eval/Treat Not Completed: Patient not medically ready. RN requesting hold on PT today. Pt currently intubated and with EVD. Will plan to follow-up tomorrow or Monday as able.   Virgil Benedict, PT, DPT Acute Rehabilitation Services  Office: [phone]    Bettina Gavia 04/13/2023, 7:42 AM

## 2023-04-13 NOTE — Progress Notes (Addendum)
eLink Physician-Brief Progress Note [name] DOB: [birth year] MRN: [id number]   Date of Service  04/13/2023  HPI/Events of Note  Pt intermittently agitated, pulling at lines and tubes.   eICU Interventions  Placed order for bilateral soft wrist restraints.  Will continue to monitor closely.         Adhya Cocco M DELA CRUZ 04/13/2023, 8:10 PM  8:45 PM Pt becoming increasingly agitated.  Currently off sedation.  Placed order for PRN fentanyl.  Will follow response.

## 2023-04-13 NOTE — Progress Notes (Signed)
NAME:  Wendy Fleming, MRN:  [id number], DOB:  [birth year], LOS: 1 ADMISSION DATE:  04/12/2023, CONSULTATION DATE:  04/12/23 REFERRING MD:  NSGY, CHIEF COMPLAINT:  AMS   History of Present Illness:  82 year old woman w/ hx of HTN presented after husband found her down minimally responsive.  Vomited multiple times en route to ER.  Intubated for airway protection in ER.  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  NSGY to place EVD and determine best method of securing aneurysm.  PCCM to consult for vent management.  Hunt Hess 3/4 mFS 2 (thin, +IVH)  Pertinent  Medical History  HTN Anemia  Significant Hospital Events: Including procedures, antibiotic start and stop dates in addition to other pertinent events   8/30 admit 8/31 Tolerating SBT, able to follow simple commands   Interim History / Subjective:  No acute issues overnight   Objective   Blood pressure 102/64, pulse 62, temperature 98.3 F (36.8 C), temperature source Axillary, resp. rate (!) 21, height 5\' 2"  (1.575 m), weight 90.2 kg, SpO2 100%.    Vent Mode: PSV;CPAP FiO2 (%):  [40 %] 40 % Set Rate:  [28 bmp-32 bmp] 32 bmp Vt Set:  [400 mL-500 mL] 400 mL PEEP:  [5 cmH20] 5 cmH20 Pressure Support:  [10 cmH20] 10 cmH20 Plateau Pressure:  [12 cmH20-21 cmH20] 14 cmH20   Intake/Output Summary (Last 24 hours) at 04/13/2023 1003 Last data filed at 04/13/2023 0900 Gross per 24 hour  Intake 1787.22 ml  Output 2375 ml  Net -587.78 ml   Filed Weights   04/12/23 1000  Weight: 90.2 kg    Examination: General: Acute on chronically ill appearing elderly female lying in bed on mechanical ventilation, in NAD HEENT: ETT, MM pink/moist, PERRL,  Neuro: Alert and able to follow simple commands on left  CV: s1s2 regular rate and rhythm, no murmur, rubs, or gallops,  PULM:  Clear to ascultation no increased work of breathing, tolerating SBT GI: soft, bowel sounds active in all 4 quadrants, non-tender,  non-distended, tolerating TF Extremities: warm/dry, no edema  Skin: no rashes or lesions  Resolved Hospital Problem list   N/A  Assessment & Plan:  Nontraumatic subarachnoid hemorrhage -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  HH 3/4, mFS 2 P: Management per NSGY  Maintain neuro protective measures; goal for eurothermia, euglycemia, eunatermia, normoxia, and PCO2 goal of 35-40 Nutrition and bowel regiment  Seizure precautions  AEDs per neurology  Aspirations precautions  Nimodipine per NSGY   Acute hypoxemic respiratory failure  -Due to encephalopathy from above as well as presumed aspiration Aspiration pneumonia  P: Continue ventilator support with lung protective strategies  Wean PEEP and FiO2 for sats greater than 90%. Head of bed elevated 30 degrees. Plateau pressures less than 30 cm H20.  Follow intermittent chest x-ray and ABG.   SAT/SBT as tolerated, mentation preclude extubation  Ensure adequate pulmonary hygiene  Follow cultures  VAP bundle in place  PAD protocol Continue Zosyn    Best Practice (right click and "Reselect all SmartList Selections" daily)   Diet/type: NPO DVT prophylaxis: SCD GI prophylaxis: PPI Lines: N/A Foley:  Yes, and it is still needed Code Status:  full code Last date of multidisciplinary goals of care discussion [per primary]  Critical care time:    Performed by: Jaylynn Mcaleer D. Harris  Total critical care time: 38 minutes  Critical care time was exclusive of separately billable procedures and treating other patients.  Critical  care was necessary to treat or prevent imminent or life-threatening deterioration.  Critical care was time spent personally by me on the following activities: development of treatment plan with patient and/or surrogate as well as nursing, discussions with consultants, evaluation of patient's response to treatment, examination of  patient, obtaining history from patient or surrogate, ordering and performing treatments and interventions, ordering and review of laboratory studies, ordering and review of radiographic studies, pulse oximetry and re-evaluation of patient's condition.  Nitzia Perren D. Harris, NP-C [facility] Personal contact information can be found on Amion  If no contact or response made please call [phone] 04/13/2023, 10:10 AM

## 2023-04-13 NOTE — Progress Notes (Signed)
OT Cancellation Note  Patient Details Name: Wendy Fleming MRN: [id number] DOB: 1940-09-14   Cancelled Treatment:    Reason Eval/Treat Not Completed: Patient not medically ready. RN requesting hold on OT today. Pt currently intubated and with EVD. Will plan to follow-up tomorrow or Monday as able.   Evern Bio Zanya Lindo 04/13/2023, 9:20 AM  Nyoka Cowden OTR/L Acute Rehabilitation Services Office: [phone]

## 2023-04-13 NOTE — Anesthesia Postprocedure Evaluation (Signed)
Anesthesia Post Note  Patient: Wendy Fleming  Procedure(s) Performed: IR WITH ANESTHESIA     Patient location during evaluation: SICU Anesthesia Type: General Level of consciousness: sedated Pain management: pain level controlled Vital Signs Assessment: post-procedure vital signs reviewed and stable Respiratory status: patient remains intubated per anesthesia plan Cardiovascular status: stable Postop Assessment: no apparent nausea or vomiting Anesthetic complications: no   No notable events documented.  Last Vitals:  Vitals:   04/13/23 0600 04/13/23 0700  BP: (!) 91/53 104/63  Pulse: 60 60  Resp: (!) 32 (!) 32  Temp:    SpO2: 99% 100%    Last Pain:  Vitals:   04/13/23 0400  TempSrc: Axillary   Pain Goal:                   Collene Schlichter

## 2023-04-13 NOTE — Progress Notes (Addendum)
Initial Nutrition Assessment  DOCUMENTATION CODES:   Obesity unspecified  INTERVENTION:  Initiate tube feeding via OGT : At 1800 start Osmolite 1.5 at 20ml, if pt tolerates without emesis advance to goal rate of 35 ml/h at 2200 (840 ml per day) Prosource TF20 60 ml once daily  Provides 1340 kcal, 73 gm protein, 640 ml free water daily  NUTRITION DIAGNOSIS:   Inadequate oral intake related to acute illness as evidenced by NPO status.  GOAL:   Patient will meet greater than or equal to 90% of their needs  MONITOR:   Diet advancement, Vent status, Labs, Weight trends, TF tolerance  REASON FOR ASSESSMENT:   Consult Enteral/tube feeding initiation and management  ASSESSMENT:   Pt admitted after being found minimally responsive d/t ruptured Acom with developing hydrocephalus. No significant PMH listed.  8/30 - R EVD placed; s/p coil embolization L acom aneurysm; OGT (side port gastric)  Pt noted to have had multiple episodes of emesis yesterday during EMS transport to hospital. Per RN pt had 1 large episode of emesis this morning. Reached out to CCM regarding TF and emesis, if no further episodes of emesis, can initiate TF tonight. Discussed with RN.   Patient is currently intubated on ventilator support MV: 8.2 L/min Temp (24hrs), Avg:97.7 F (36.5 C), Min:97 F (36.1 C), Max:99.3 F (37.4 C) MAP 91  Propofol: 16.24 ml/hr (provide 429 kcal/d)  Limited weight history on file to assess for weight loss/gain.  Admit weight: 90.2 kg  Medications: colace (not given), IV protonix Drips: NaCl @ 73ml/hr Abx Neo d/c  Labs: potassium 3.4, ionized Ca 1.09  UOP: 2.2L x24 hours EVD: 97ml x24 hours + 15ml x4 hours  NUTRITION - FOCUSED PHYSICAL EXAM: RD working remotely. Deferred to follow up.   Diet Order:   Diet Order             Diet NPO time specified  Diet effective now                   EDUCATION NEEDS:   No education needs have been identified at this  time  Skin:  Skin Assessment: Reviewed RN Assessment  Last BM:  PTA  Height:   Ht Readings from Last 1 Encounters:  04/12/23 5\' 2"  (1.575 m)    Weight:   Wt Readings from Last 1 Encounters:  04/12/23 90.2 kg    Ideal Body Weight:  50 kg  BMI:  Body mass index is 36.37 kg/m.  Estimated Nutritional Needs:   Kcal:  1300-1500  Protein:  65-80g  Fluid:  >/=1.5L  Drusilla Kanner, RDN, LDN Clinical Nutrition

## 2023-04-14 ENCOUNTER — Inpatient Hospital Stay (HOSPITAL_COMMUNITY): Payer: Medicare HMO

## 2023-04-14 ENCOUNTER — Other Ambulatory Visit (HOSPITAL_COMMUNITY): Payer: Medicare HMO

## 2023-04-14 DIAGNOSIS — I6389 Other cerebral infarction: Secondary | ICD-10-CM

## 2023-04-14 DIAGNOSIS — I5030 Unspecified diastolic (congestive) heart failure: Secondary | ICD-10-CM | POA: Diagnosis not present

## 2023-04-14 DIAGNOSIS — I082 Rheumatic disorders of both aortic and tricuspid valves: Secondary | ICD-10-CM | POA: Diagnosis not present

## 2023-04-14 DIAGNOSIS — J9601 Acute respiratory failure with hypoxia: Secondary | ICD-10-CM | POA: Diagnosis not present

## 2023-04-14 DIAGNOSIS — I609 Nontraumatic subarachnoid hemorrhage, unspecified: Secondary | ICD-10-CM | POA: Diagnosis not present

## 2023-04-14 LAB — GLUCOSE, CAPILLARY
Glucose-Capillary: 114 mg/dL — ABNORMAL HIGH (ref 70–99)
Glucose-Capillary: 119 mg/dL — ABNORMAL HIGH (ref 70–99)
Glucose-Capillary: 121 mg/dL — ABNORMAL HIGH (ref 70–99)
Glucose-Capillary: 123 mg/dL — ABNORMAL HIGH (ref 70–99)
Glucose-Capillary: 140 mg/dL — ABNORMAL HIGH (ref 70–99)
Glucose-Capillary: 144 mg/dL — ABNORMAL HIGH (ref 70–99)

## 2023-04-14 LAB — CBC
HCT: 34.8 % — ABNORMAL LOW (ref 36.0–46.0)
Hemoglobin: 11 g/dL — ABNORMAL LOW (ref 12.0–15.0)
MCH: 28.9 pg (ref 26.0–34.0)
MCHC: 31.6 g/dL (ref 30.0–36.0)
MCV: 91.6 fL (ref 80.0–100.0)
Platelets: 176 10*3/uL (ref 150–400)
RBC: 3.8 MIL/uL — ABNORMAL LOW (ref 3.87–5.11)
RDW: 14.1 % (ref 11.5–15.5)
WBC: 8.3 10*3/uL (ref 4.0–10.5)
nRBC: 0 % (ref 0.0–0.2)

## 2023-04-14 LAB — ECHOCARDIOGRAM COMPLETE
AR max vel: 1.85 cm2
AV Area VTI: 1.67 cm2
AV Area mean vel: 1.63 cm2
AV Mean grad: 9 mmHg
AV Peak grad: 15.8 mmHg
Ao pk vel: 1.99 m/s
Area-P 1/2: 3.93 cm2
Height: 62 in
MV M vel: 4.23 m/s
MV Peak grad: 71.6 mmHg
P 1/2 time: 359 msec
S' Lateral: 3.3 cm
Weight: 3181.68 [oz_av]

## 2023-04-14 LAB — BASIC METABOLIC PANEL
Anion gap: 9 (ref 5–15)
BUN: 9 mg/dL (ref 8–23)
CO2: 21 mmol/L — ABNORMAL LOW (ref 22–32)
Calcium: 8.8 mg/dL — ABNORMAL LOW (ref 8.9–10.3)
Chloride: 109 mmol/L (ref 98–111)
Creatinine, Ser: 0.75 mg/dL (ref 0.44–1.00)
GFR, Estimated: >60 mL/min (ref >60)
Glucose, Bld: 140 mg/dL — ABNORMAL HIGH (ref 70–99)
Potassium: 3.2 mmol/L — ABNORMAL LOW (ref 3.5–5.1)
Sodium: 139 mmol/L (ref 135–145)

## 2023-04-14 LAB — MAGNESIUM: Magnesium: 2.3 mg/dL (ref 1.7–2.4)

## 2023-04-14 LAB — PHOSPHORUS: Phosphorus: 2.3 mg/dL — ABNORMAL LOW (ref 2.5–4.6)

## 2023-04-14 MED ORDER — SODIUM CHLORIDE 0.9 % IV SOLN: INTRAVENOUS | Status: DC

## 2023-04-14 MED ORDER — ORAL CARE MOUTH RINSE: 15.0000 mL | OROMUCOSAL | Status: DC | PRN

## 2023-04-14 MED ORDER — HEPARIN SODIUM (PORCINE) 5000 UNIT/ML IJ SOLN
5000.0000 [IU] | Freq: Three times a day (TID) | INTRAMUSCULAR | Status: DC
  Administered 2023-04-14 – 2023-04-28 (×41): 5000 [IU] via SUBCUTANEOUS
  Filled 2023-04-14 (×40): qty 1

## 2023-04-14 MED ORDER — POTASSIUM PHOSPHATES 15 MMOLE/5ML IV SOLN
30.0000 mmol | Freq: Once | INTRAVENOUS | Status: AC
  Administered 2023-04-14: 30 mmol via INTRAVENOUS
  Filled 2023-04-14: qty 10

## 2023-04-14 MED ORDER — HYDRALAZINE HCL 10 MG PO TABS: 10.0000 mg | ORAL_TABLET | Freq: Four times a day (QID) | ORAL | Status: DC | PRN

## 2023-04-14 NOTE — Progress Notes (Signed)
eLink Physician-Brief Progress Note [name] DOB: 1941-06-17 MRN: [id number]   Date of Service  04/14/2023  HPI/Events of Note  BSRN requested for A-line removal. Line irritating patient.  Patient was extubated earlier today with no complications Not currently on pressors Adequate PIV per RN  eICU Interventions  Order to DC A-line     Intervention Category Minor Interventions: Routine modifications to care plan (e.g. PRN medications for pain, fever)  Wendy Fleming 04/14/2023, 8:29 PM

## 2023-04-14 NOTE — Progress Notes (Signed)
No new events or problems overnight.  Patient remains intubated.  She awakens and will follow commands bilaterally.  Strength equal.  Ventriculostomy working reasonably well.  Output blood-tinged.  Status post coil embolization of ruptured ACOM aneurysm.  Continue supportive efforts.  Wean towards extubation.  Begin IV fluids.  Check follow-up head CT scan in morning.  Continue ventriculostomy.

## 2023-04-14 NOTE — Progress Notes (Signed)
Echocardiogram 2D Echocardiogram has been performed.  Lucendia Herrlich 04/14/2023, 3:02 PM

## 2023-04-14 NOTE — Evaluation (Signed)
Physical Therapy Evaluation Patient Details Name: Wendy Fleming MRN: [id number] DOB: 1941-02-26 Today's Date: 04/14/2023  History of Present Illness  Pt is an 82 y.o. female who presented 04/12/23 after being found minimally responsive. 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 ETT 8/30 - 9/1. PMH: anemia, arthritis, HTN   Clinical Impression  Pt presents with condition above and deficits mentioned below, see PT Problem List. Pt was minimally verbal only stating "yes" 1x and potentially her husband's name during the session. She also demonstrates deficits in cognition, no family was present during session, and spouse did not answer call from OT to provide PLOF/home info. Per chart review, pt is assumed to have been independent and living with her spouse PTA. Currently, pt displays delayed processing and inconsistency with following simple commands. She also seems to have difficulty crossing midline to look and attend to her R, spontaneously moving her L UE but not her R during session. She also displays deficits in balance, activity tolerance, and strength (R weaker than L). She was able to kick her legs and wiggle her toes bil when cued while sitting EOB, but the L moved better than the R. She did not follow cues to move her legs when supine in bed. At this time, pt is requiring total assist x2 for bed mobility and primarily modA for static sitting balance. As pt has likely had a drastic functional decline, she could greatly benefit from intensive inpatient rehab, > 3 hours/day. Will continue to follow acutely.      If plan is discharge home, recommend the following: Two people to help with walking and/or transfers;Two people to help with bathing/dressing/bathroom;Assistance with cooking/housework;Direct supervision/assist for medications management;Direct supervision/assist for financial management;Assist  for transportation;Help with stairs or ramp for entrance   Can travel by private vehicle        Equipment Recommendations Other (comment) (defer to next venue of care)  Recommendations for Other Services  Rehab consult    Functional Status Assessment Patient has had a recent decline in their functional status and demonstrates the ability to make significant improvements in function in a reasonable and predictable amount of time.     Precautions / Restrictions Precautions Precautions: Fall;Other (comment) Precaution Comments: EVD (clamp for sessions); A-line; fecal system Restrictions Weight Bearing Restrictions: No      Mobility  Bed Mobility Overal bed mobility: Needs Assistance Bed Mobility: Supine to Sit, Sit to Supine     Supine to sit: Total assist, +2 for physical assistance, +2 for safety/equipment, HOB elevated Sit to supine: Total assist, +2 for physical assistance, +2 for safety/equipment, HOB elevated   General bed mobility comments: Total assist x2 to transition pt supine <> sit R EOB using the bed pad to pivot and slide her hips.    Transfers                   General transfer comment: deferred    Ambulation/Gait               General Gait Details: deferred  Stairs            Wheelchair Mobility     Tilt Bed    Modified Rankin (Stroke Patients Only) Modified Rankin (Stroke Patients Only) Pre-Morbid Rankin Score: No symptoms Modified Rankin: Severe disability     Balance Overall balance assessment: Needs assistance Sitting-balance support: No upper extremity supported, Feet supported Sitting balance-Leahy Scale: Poor Sitting balance -  Comments: ModA for static sitting balance, when assistance was removed she would often slowly lose balance posteriorly but seemed to try to react to keep her balance, modA to recover. Intermittent LOB anteriorly though with no attempts to correct it and needing total assist to regain balance  again. Leans to L as well. Moments of CGA for periods up to ~20 sec by the end. Postural control: Posterior lean, Left lateral lean     Standing balance comment: deferred                             Pertinent Vitals/Pain Pain Assessment Pain Assessment: Faces Faces Pain Scale: Hurts little more Pain Location: generalized with mobility Pain Descriptors / Indicators: Grimacing Pain Intervention(s): Limited activity within patient's tolerance, Monitored during session, Repositioned    Home Living Family/patient expects to be discharged to:: Private residence Living Arrangements: Spouse/significant other (assumed, per chart)                 Additional Comments: OT attempted to call husband, but no answer, pt unable to provide info at this time    Prior Function Prior Level of Function : Independent/Modified Independent (assumed, per chart)                     Extremity/Trunk Assessment   Upper Extremity Assessment Upper Extremity Assessment: Defer to OT evaluation    Lower Extremity Assessment Lower Extremity Assessment: RLE deficits/detail;LLE deficits/detail RLE Deficits / Details: R seemed weaker than L, MMT scores of 2+ ankle dorsiflexion and knee extension; denied change in sensation bil, but may benefit from further assessment LLE Deficits / Details: R seemed weaker than L, MMT scores of >/= 3 ankle dorsiflexion and 3- knee extension; denied change in sensation bil, but may benefit from further assessment    Cervical / Trunk Assessment Cervical / Trunk Assessment: Kyphotic  Communication   Communication Communication: Difficulty following commands/understanding;Difficulty communicating thoughts/reduced clarity of speech Following commands: Follows one step commands inconsistently;Follows one step commands with increased time  Cognition Arousal: Alert, Lethargic Behavior During Therapy: Flat affect (intermittent smiling) Overall Cognitive  Status: Impaired/Different from baseline Area of Impairment: Attention, Following commands, Safety/judgement, Awareness, Problem solving                   Current Attention Level: Focused   Following Commands: Follows one step commands inconsistently, Follows one step commands with increased time Safety/Judgement: Decreased awareness of safety, Decreased awareness of deficits Awareness: Intellectual Problem Solving: Slow processing, Decreased initiation, Difficulty sequencing, Requires verbal cues, Requires tactile cues General Comments: Pt opening eyes wide when name called and able to look to L easier than R to attend to therapists. Able to track to midline, but seems to have difficulty going past that to the R. Slow to process simple cues, responding with head shakes/nods no/yes within ~5-10 seconds, but needing >10 seconds at times to initiate movement of extremities when cued. Follows ~20% of cues. Stated "yes" and potentially her husband's name, but otherwise nonverbal.        General Comments General comments (skin integrity, edema, etc.): SpO2 >/= 98% on 3L O2 via [place]; RR in 20s; RN present throughout session and clamped EVD prior to session and notified of end of session to unclamp it    Exercises     Assessment/Plan    PT Assessment Patient needs continued PT services  PT Problem List Decreased strength;Decreased activity tolerance;Decreased balance;Decreased  mobility;Decreased coordination;Decreased cognition;Decreased safety awareness;Cardiopulmonary status limiting activity       PT Treatment Interventions DME instruction;Gait training;Functional mobility training;Stair training;Therapeutic activities;Therapeutic exercise;Balance training;Cognitive remediation;Neuromuscular re-education;Patient/family education    PT Goals (Current goals can be found in the Care Plan section)  Acute Rehab PT Goals Patient Stated Goal: did not state PT Goal Formulation: Patient  unable to participate in goal setting Time For Goal Achievement: 04/28/23 Potential to Achieve Goals: Good    Frequency Min 1X/week     Co-evaluation PT/OT/SLP Co-Evaluation/Treatment: Yes Reason for Co-Treatment: Complexity of the patient's impairments (multi-system involvement);Necessary to address cognition/behavior during functional activity;For patient/therapist safety;To address functional/ADL transfers PT goals addressed during session: Mobility/safety with mobility;Balance         AM-PAC PT "6 Clicks" Mobility  Outcome Measure Help needed turning from your back to your side while in a flat bed without using bedrails?: Total Help needed moving from lying on your back to sitting on the side of a flat bed without using bedrails?: Total Help needed moving to and from a bed to a chair (including a wheelchair)?: Total Help needed standing up from a chair using your arms (e.g., wheelchair or bedside chair)?: Total Help needed to walk in hospital room?: Total Help needed climbing 3-5 steps with a railing? : Total 6 Click Score: 6    End of Session Equipment Utilized During Treatment: Oxygen Activity Tolerance: Patient limited by lethargy;Patient limited by fatigue Patient left: in bed;with call bell/phone within reach;with bed alarm set;with nursing/sitter in room Nurse Communication: Mobility status;Other (comment) (to clamp EVD for session; vitals throughout - RN present and watching throughout session) PT Visit Diagnosis: Muscle weakness (generalized) (M62.81);Difficulty in walking, not elsewhere classified (R26.2);Other symptoms and signs involving the nervous system (R29.898)    Time: 6045-4098 PT Time Calculation (min) (ACUTE ONLY): 22 min   Charges:   PT Evaluation $PT Eval Moderate Complexity: 1 Mod   PT General Charges $$ ACUTE PT VISIT: 1 Visit         Virgil Benedict, PT, DPT Acute Rehabilitation Services  Office: [phone]   Bettina Gavia 04/14/2023,  4:30 PM

## 2023-04-14 NOTE — Evaluation (Signed)
Occupational Therapy Evaluation Patient Details Name: Wendy Fleming MRN: [id number] DOB: 1941-05-20 Today's Date: 04/14/2023   History of Present Illness Pt is an 82 y.o. female who presented 04/12/23 after being found minimally responsive. 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 ETT 8/30 - 9/1. PMH: anemia, arthritis, HTN   Clinical Impression   Patient admitted for the diagnosis above.  Patient very lethargic, unable to ascertain any prior level of function.  Attempted to call spouse with no answer.  Currently patient is total assist with ADL and mobility, but hopefully as patient becomes more alert, OT can get a better assessment.  Given the patient's presumed Independence at home, OT would like to try for AIR for post acute rehab when appropriate.  OT will continue efforts in the acute setting to address deficits.         If plan is discharge home, recommend the following: Assist for transportation;Assistance with cooking/housework;Two people to help with walking and/or transfers;A lot of help with bathing/dressing/bathroom;Direct supervision/assist for medications management;Direct supervision/assist for financial management    Functional Status Assessment  Patient has had a recent decline in their functional status and demonstrates the ability to make significant improvements in function in a reasonable and predictable amount of time.  Equipment Recommendations  None recommended by OT    Recommendations for Other Services       Precautions / Restrictions Precautions Precautions: Fall;Other (comment) Precaution Comments: EVD (clamp for sessions); A-line; fecal system Restrictions Weight Bearing Restrictions: No      Mobility Bed Mobility   Bed Mobility: Supine to Sit, Sit to Supine     Supine to sit: Total assist, +2 for physical assistance, +2 for safety/equipment, HOB  elevated Sit to supine: Total assist, +2 for physical assistance, +2 for safety/equipment, HOB elevated        Transfers Overall transfer level: Needs assistance                 General transfer comment: deferred      Balance Overall balance assessment: Needs assistance Sitting-balance support: No upper extremity supported, Feet supported Sitting balance-Leahy Scale: Poor   Postural control: Posterior lean, Left lateral lean                                 ADL either performed or assessed with clinical judgement   ADL                                         General ADL Comments: Total assist at bedlevel     Vision   Vision Assessment?: Vision impaired- to be further tested in functional context Additional Comments: ? L field preference, ? tracking past midline to the R.  Very lethargic     Perception Perception: Impaired Preception Impairment Details: Inattention/Neglect     Praxis Praxis: Impaired       Pertinent Vitals/Pain Pain Assessment Pain Assessment: Faces Faces Pain Scale: Hurts little more Pain Location: generalized with mobility Pain Descriptors / Indicators: Grimacing Pain Intervention(s): Monitored during session     Extremity/Trunk Assessment Upper Extremity Assessment Upper Extremity Assessment: Generalized weakness;RUE deficits/detail;LUE deficits/detail RUE Deficits / Details: Minimal finger movement noted, nothing to command RUE Sensation: decreased light touch RUE Coordination: decreased fine motor;decreased gross motor LUE Deficits /  Details: Spontaneous AROM to elbow distal, but very weak. LUE Sensation: decreased light touch LUE Coordination: decreased fine motor;decreased gross motor   Lower Extremity Assessment Lower Extremity Assessment: Defer to PT evaluation RLE Deficits / Details: R seemed weaker than L, MMT scores of 2+ ankle dorsiflexion and knee extension; denied change in sensation bil,  but may benefit from further assessment LLE Deficits / Details: R seemed weaker than L, MMT scores of >/= 3 ankle dorsiflexion and 3- knee extension; denied change in sensation bil, but may benefit from further assessment   Cervical / Trunk Assessment Cervical / Trunk Assessment: Kyphotic   Communication Communication Communication: Difficulty following commands/understanding;Difficulty communicating thoughts/reduced clarity of speech Following commands: Follows one step commands inconsistently;Follows one step commands with increased time   Cognition Arousal: Lethargic Behavior During Therapy: Flat affect Overall Cognitive Status: Difficult to assess                     Current Attention Level: Focused   Following Commands: Follows one step commands inconsistently Safety/Judgement: Decreased awareness of safety, Decreased awareness of deficits Awareness: Intellectual Problem Solving: Slow processing, Decreased initiation, Difficulty sequencing, Requires verbal cues, Requires tactile cues       General Comments  SpO2 >/= 98% on 3L O2 via [place]; RR in 20s; RN present throughout session and clamped EVD prior to session and notified of end of session to unclamp it    Exercises     Shoulder Instructions      Home Living Family/patient expects to be discharged to:: Private residence Living Arrangements: Spouse/significant other                               Additional Comments: OT attempted to call husband, but no answer, pt unable to provide info at this time      Prior Functioning/Environment Prior Level of Function : Independent/Modified Independent                        OT Problem List: Decreased strength;Decreased range of motion;Decreased activity tolerance;Impaired balance (sitting and/or standing);Impaired vision/perception;Decreased coordination;Impaired sensation;Pain;Decreased safety awareness;Decreased cognition      OT  Treatment/Interventions: Self-care/ADL training;Therapeutic activities;Therapeutic exercise;Cognitive remediation/compensation;Visual/perceptual remediation/compensation;Neuromuscular education;Patient/family education;Balance training    OT Goals(Current goals can be found in the care plan section) Acute Rehab OT Goals OT Goal Formulation: Patient unable to participate in goal setting Time For Goal Achievement: 04/29/23 Potential to Achieve Goals: Good ADL Goals Pt Will Perform Grooming: with min assist;sitting Additional ADL Goal #1: Patient will sit EOB for up to 10 min with CGA to increase ADL tolerance Additional ADL Goal #2: Patient will follow one step command 50% of the time with Min VC's for ADL activity  OT Frequency: Min 1X/week    Co-evaluation PT/OT/SLP Co-Evaluation/Treatment: Yes Reason for Co-Treatment: Complexity of the patient's impairments (multi-system involvement);Necessary to address cognition/behavior during functional activity;For patient/therapist safety;To address functional/ADL transfers PT goals addressed during session: Mobility/safety with mobility;Balance OT goals addressed during session: ADL's and self-care      AM-PAC OT "6 Clicks" Daily Activity     Outcome Measure Help from another person eating meals?: Total Help from another person taking care of personal grooming?: Total Help from another person toileting, which includes using toliet, bedpan, or urinal?: Total Help from another person bathing (including washing, rinsing, drying)?: Total Help from another person to put on and taking off regular upper body  clothing?: Total Help from another person to put on and taking off regular lower body clothing?: Total 6 Click Score: 6   End of Session Nurse Communication: Mobility status  Activity Tolerance: Patient limited by lethargy;Treatment limited secondary to medical complications (Comment) Patient left: in bed;with call bell/phone within reach;with  bed alarm set  OT Visit Diagnosis: Muscle weakness (generalized) (M62.81);Hemiplegia and hemiparesis Hemiplegia - Right/Left: Right Hemiplegia - dominant/non-dominant: Dominant Hemiplegia - caused by: Nontraumatic intracerebral hemorrhage                Time: 1535-1552 OT Time Calculation (min): 17 min Charges:  OT General Charges $OT Visit: 1 Visit OT Evaluation $OT Eval Moderate Complexity: 1 Mod  04/14/2023  RP, OTR/L  Acute Rehabilitation Services  Office:  [phone]   Suzanna Obey 04/14/2023, 5:33 PM

## 2023-04-14 NOTE — Progress Notes (Signed)
NAME:  Wendy Fleming, MRN:  [id number], DOB:  1941-05-24, LOS: 2 ADMISSION DATE:  04/12/2023, CONSULTATION DATE:  04/12/23 REFERRING MD:  NSGY, CHIEF COMPLAINT:  AMS   History of Present Illness:  82 year old woman w/ hx of HTN presented after husband found her down minimally responsive.  Vomited multiple times en route to ER.  Intubated for airway protection in ER.  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  NSGY to place EVD and determine best method of securing aneurysm.  PCCM to consult for vent management.  [facility] 3/4 mFS 2 (thin, +IVH)  Pertinent  Medical History  HTN Anemia  Significant Hospital Events: Including procedures, antibiotic start and stop dates in addition to other pertinent events   8/30 admit 8/31 Tolerating SBT, able to follow simple commands  9/1 intermittent episodes of agitation overnight, bilateral wrist restraints added  Interim History / Subjective:  Opens eyes to verbal stimuli this a.m. Family updated at bedside  Objective   Blood pressure 129/71, pulse 85, temperature (!) 97.1 F (36.2 C), temperature source Axillary, resp. rate (!) 31, height 5\' 2"  (1.575 m), weight 90.2 kg, SpO2 97%.    Vent Mode: CPAP;PSV FiO2 (%):  [40 %] 40 % Set Rate:  [32 bmp] 32 bmp Vt Set:  [400 mL] 400 mL PEEP:  [5 cmH20] 5 cmH20 Pressure Support:  [8 cmH20] 8 cmH20 Plateau Pressure:  [15 cmH20] 15 cmH20   Intake/Output Summary (Last 24 hours) at 04/14/2023 1128 Last data filed at 04/14/2023 1104 Gross per 24 hour  Intake 720.23 ml  Output 615 ml  Net 105.23 ml   Filed Weights   04/12/23 1000  Weight: 90.2 kg    Examination: General: Acute on chronic ill-appearing elderly female lying in bed in no acute distress HEENT: ETT, MM pink/moist, PERRL,  Neuro: Opens eyes to verbal stimuli, right-sided weakness CV: s1s2 regular rate and rhythm, no murmur, rubs, or gallops,  PULM: Bilateral rhonchi, tolerating SBT currently, no increased work  of breathing GI: soft, bowel sounds active in all 4 quadrants, non-tender, non-distended Extremities: warm/dry, no edema  Skin: no rashes or lesions   Resolved Hospital Problem list   N/A  Assessment & Plan:  Nontraumatic subarachnoid hemorrhage -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  HH 3/4, mFS 2 P: Management per neurosurgery Maintain neuroprotective measures Seizure precautions Aspiration precautions Nimodipine per neurosurgery Repeat imaging per neurosurgery  Acute hypoxemic respiratory failure  -Due to encephalopathy from above as well as presumed aspiration Aspiration pneumonia  P: Tolerating SBT this morning but on auscultation appears to have persistent bilateral rhonchi  Consider trial of extubation, family would want patient reintubated if fails Continue ventilator support with lung protective strategies  Wean PEEP and FiO2 for sats greater than 90%. Head of bed elevated 30 degrees. Plateau pressures less than 30 cm H20.  Follow intermittent chest x-ray and ABG.   SAT/SBT as tolerated, mentation preclude extubation  Ensure adequate pulmonary hygiene  Follow cultures  VAP bundle in place  PAD protocol Continue empiric Zosyn   Hypokalemia P: Trend bmet  Supplement  Best Practice (right click and "Reselect all SmartList Selections" daily)   Diet/type: NPO DVT prophylaxis: SCD GI prophylaxis: PPI Lines: N/A Foley:  Yes, and it is still needed Code Status:  full code Last date of multidisciplinary goals of care discussion [per primary]  Critical care time:    Performed by: Magdaleno Lortie D. Harris  Total critical care time:  38 minutes  Critical care time was exclusive of separately billable procedures and treating other patients.  Critical care was necessary to treat or prevent imminent or life-threatening deterioration.  Critical care was time spent personally by me on the  following activities: development of treatment plan with patient and/or surrogate as well as nursing, discussions with consultants, evaluation of patient's response to treatment, examination of patient, obtaining history from patient or surrogate, ordering and performing treatments and interventions, ordering and review of laboratory studies, ordering and review of radiographic studies, pulse oximetry and re-evaluation of patient's condition.  Lamyra Malcolm D. Harris, NP-C [facility] Personal contact information can be found on Amion  If no contact or response made please call [phone] 04/14/2023, 11:28 AM

## 2023-04-14 NOTE — Progress Notes (Signed)
Spoke with Dr. Marilynne Halsted in Radiology about NG placement after advancement of tube. Per Dr. Marilynne Halsted, NG placement is good for use.  NG is secured at 60 at the nare

## 2023-04-14 NOTE — Procedures (Signed)
Extubation Procedure Note  Patient Details:   Name: Wendy Fleming DOB: 1940-11-14 MRN: [id number]   Airway Documentation:    Vent end date: 04/14/23 Vent end time: 1245   Evaluation  O2 sats: stable throughout Complications: No apparent complications Patient did tolerate procedure well. Bilateral Breath Sounds: Rhonchi, Diminished   No  RT extubated patient to 3L [place] per MD order with RN at bedside. Positive cuff leak noted. Patient tolerated well. No stridor or distress noted at this time. RT will continue to monitor as needed.   Jaquelyn Bitter 04/14/2023, 1:29 PM

## 2023-04-14 DEATH — deceased

## 2023-04-15 ENCOUNTER — Inpatient Hospital Stay (HOSPITAL_COMMUNITY): Payer: Medicare HMO

## 2023-04-15 DIAGNOSIS — I609 Nontraumatic subarachnoid hemorrhage, unspecified: Secondary | ICD-10-CM

## 2023-04-15 LAB — GLUCOSE, CAPILLARY
Glucose-Capillary: 103 mg/dL — ABNORMAL HIGH (ref 70–99)
Glucose-Capillary: 107 mg/dL — ABNORMAL HIGH (ref 70–99)
Glucose-Capillary: 112 mg/dL — ABNORMAL HIGH (ref 70–99)
Glucose-Capillary: 117 mg/dL — ABNORMAL HIGH (ref 70–99)
Glucose-Capillary: 118 mg/dL — ABNORMAL HIGH (ref 70–99)
Glucose-Capillary: 131 mg/dL — ABNORMAL HIGH (ref 70–99)

## 2023-04-15 LAB — BASIC METABOLIC PANEL
Anion gap: 15 (ref 5–15)
BUN: 10 mg/dL (ref 8–23)
CO2: 19 mmol/L — ABNORMAL LOW (ref 22–32)
Calcium: 8.7 mg/dL — ABNORMAL LOW (ref 8.9–10.3)
Chloride: 105 mmol/L (ref 98–111)
Creatinine, Ser: 0.56 mg/dL (ref 0.44–1.00)
GFR, Estimated: >60 mL/min (ref >60)
Glucose, Bld: 118 mg/dL — ABNORMAL HIGH (ref 70–99)
Potassium: 4.2 mmol/L (ref 3.5–5.1)
Sodium: 139 mmol/L (ref 135–145)

## 2023-04-15 LAB — MAGNESIUM: Magnesium: 1.7 mg/dL (ref 1.7–2.4)

## 2023-04-15 LAB — PHOSPHORUS: Phosphorus: 1.7 mg/dL — ABNORMAL LOW (ref 2.5–4.6)

## 2023-04-15 MED ORDER — OSMOLITE 1.5 CAL PO LIQD
1000.0000 mL | ORAL | Status: DC
  Administered 2023-04-15 – 2023-04-30 (×12): 1000 mL
  Filled 2023-04-15: qty 1000

## 2023-04-15 MED ORDER — ORAL CARE MOUTH RINSE: 15.0000 mL | OROMUCOSAL | Status: DC | PRN

## 2023-04-15 MED ORDER — MAGNESIUM SULFATE 2 GM/50ML IV SOLN
2.0000 g | Freq: Once | INTRAVENOUS | Status: AC
  Administered 2023-04-15: 2 g via INTRAVENOUS
  Filled 2023-04-15: qty 50

## 2023-04-15 MED ORDER — SODIUM PHOSPHATES 45 MMOLE/15ML IV SOLN
30.0000 mmol | Freq: Once | INTRAVENOUS | Status: AC
  Administered 2023-04-15: 30 mmol via INTRAVENOUS
  Filled 2023-04-15: qty 10

## 2023-04-15 MED ORDER — ORAL CARE MOUTH RINSE
15.0000 mL | OROMUCOSAL | Status: DC
  Administered 2023-04-16 – 2023-04-23 (×28): 15 mL via OROMUCOSAL

## 2023-04-15 MED ORDER — ADULT MULTIVITAMIN W/MINERALS CH
1.0000 | ORAL_TABLET | Freq: Every day | ORAL | Status: DC
  Administered 2023-04-15 – 2023-04-30 (×16): 1
  Filled 2023-04-15 (×16): qty 1

## 2023-04-15 NOTE — Evaluation (Signed)
Clinical/Bedside Swallow Evaluation Patient Details  Name: Wendy Fleming MRN: [id number] Date of Birth: 02/23/1941  Today's Date: 04/15/2023 Time: SLP Start Time (ACUTE ONLY): 4098 SLP Stop Time (ACUTE ONLY): 0850 SLP Time Calculation (min) (ACUTE ONLY): 13 min  Past Medical History:  Past Medical History:  Diagnosis Date   Anemia    many years ago   Arthritis    Headache    rare   Hypertension    Past Surgical History:  Past Surgical History:  Procedure Laterality Date   ankle surgery for fracture Right 01-19-2014   COLONOSCOPY WITH PROPOFOL N/A 06/07/2014   Procedure: COLONOSCOPY WITH PROPOFOL;  Surgeon: Charolett Bumpers, MD;  Location: [facility];  Service: Endoscopy;  Laterality: N/A;   GALLBLADDER SURGERY     IR 3D INDEPENDENT WKST  04/12/2023   IR ANGIO VERTEBRAL SEL VERTEBRAL UNI L MOD SED  04/12/2023   IR ANGIOGRAM FOLLOW UP STUDY  04/12/2023   IR ANGIOGRAM FOLLOW UP STUDY  04/12/2023   IR ANGIOGRAM FOLLOW UP STUDY  04/12/2023   IR ANGIOGRAM FOLLOW UP STUDY  04/12/2023   IR ANGIOGRAM FOLLOW UP STUDY  04/12/2023   IR ANGIOGRAM FOLLOW UP STUDY  04/12/2023   IR ANGIOGRAM FOLLOW UP STUDY  04/12/2023   IR TRANSCATH/EMBOLIZ  04/12/2023   RADIOLOGY WITH ANESTHESIA N/A 04/12/2023   Procedure: IR WITH ANESTHESIA;  Surgeon: Lisbeth Renshaw, MD;  Location: [facility] OR;  Service: Radiology;  Laterality: N/A;   TUBAL LIGATION     HPI:  Pt is an 82 y.o. female who presented 04/12/23 after being found minimally responsive. 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 ETT 8/30 - 9/1. PMH: anemia, arthritis, HTN    Assessment / Plan / Recommendation  Clinical Impression  Pt presents with signs of an oropharyngeal dysphagia that is likely due to a combination of altered mentation, recent intubation, and acute neurological event. She is sleepy but will have moments of increased alertness that are  sufficient to attempt PO trials. There are times that she does start to fall back asleep with boluses in her mouth (mostly with purees), and needs frequent stimulation to maintain alertness in between trials. Throat clearing associated with thin liquids could be concerning for possible dysphagia. She inconsistently follows commands and her cued cough is subjectively weak. Recommend starting with ice chips after oral care when pt is fully alert. As alertness improves, will determine if instrumental testing is indicated.  SLP Visit Diagnosis: Dysphagia, unspecified (R13.10)    Aspiration Risk  Moderate aspiration risk    Diet Recommendation NPO;Ice chips PRN after oral care    Medication Administration: Via alternative means    Other  Recommendations Oral Care Recommendations: Oral care QID Caregiver Recommendations: Have oral suction available    Recommendations for follow up therapy are one component of a multi-disciplinary discharge planning process, led by the attending physician.  Recommendations may be updated based on patient status, additional functional criteria and insurance authorization.  Follow up Recommendations Acute inpatient rehab (3hours/day)      Assistance Recommended at Discharge    Functional Status Assessment Patient has had a recent decline in their functional status and demonstrates the ability to make significant improvements in function in a reasonable and predictable amount of time.  Frequency and Duration min 2x/week  2 weeks       Prognosis Prognosis for improved oropharyngeal function: Good Barriers to Reach Goals: Cognitive deficits  Swallow Study   General HPI: Pt is an 82 y.o. female who presented 04/12/23 after being found minimally responsive. 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 ETT 8/30 - 9/1. PMH: anemia, arthritis, HTN Type of Study: Bedside  Swallow Evaluation Previous Swallow Assessment: none in chart Diet Prior to this Study: NPO;Large bore NG tube Temperature Spikes Noted: No Respiratory Status: Nasal cannula History of Recent Intubation: Yes Total duration of intubation (days): 2 days Date extubated: 04/14/23 Behavior/Cognition: Lethargic/Drowsy;Cooperative;Requires cueing Oral Cavity Assessment: Other (comment) (small amount of bruising on tongue) Oral Care Completed by SLP: No Oral Cavity - Dentition: Adequate natural dentition Vision: Functional for self-feeding Self-Feeding Abilities: Total assist Patient Positioning: Upright in bed Baseline Vocal Quality: Low vocal intensity Volitional Cough: Weak Volitional Swallow: Unable to elicit    Oral/Motor/Sensory Function Overall Oral Motor/Sensory Function: Other (comment) (difficulty following commands to assess; did protrude tongue at midline)   Ice Chips Ice chips: Impaired Presentation: Spoon Oral Phase Impairments: Poor awareness of bolus   Thin Liquid Thin Liquid: Impaired Presentation: Spoon;Straw Pharyngeal  Phase Impairments: Throat Clearing - Immediate    Nectar Thick Nectar Thick Liquid: Not tested   Honey Thick Honey Thick Liquid: Not tested   Puree Puree: Impaired Presentation: Spoon Oral Phase Impairments: Poor awareness of bolus Pharyngeal Phase Impairments: Suspected delayed Swallow   Solid     Solid: Not tested      Mahala Menghini., M.A. CCC-SLP Acute Rehabilitation Services Office [phone]  Secure chat preferred  04/15/2023,10:12 AM

## 2023-04-15 NOTE — Progress Notes (Signed)
Transcranial Doppler  Date POD PCO2 HCT BP  MCA ACA PCA OPHT SIPH VERT Basilar  8/31 CK     Right  Left   *  *   *  *   *  31   14  18    *  18   -24  -16   *      9/2 RH     Right  Left   43  28   *  *   *  *   11  10   36  -20   -29  -11   -22           Right  Left                                             Right  Left                                             Right  Left                                            Right  Left                                            Right  Left                                        *unable to insonate   MCA = Middle Cerebral Artery      OPHT = Opthalmic Artery     BASILAR = Basilar Artery   ACA = Anterior Cerebral Artery     SIPH = Carotid Siphon PCA = Posterior Cerebral Artery   VERT = Verterbral Artery                   Normal MCA = 62+\-12 ACA = 50+\-12 PCA = 42+\-23   RT Lind[place]ard = 2.7 , LT Lindegaard = 1.6  Jean Rosenthal, RDMS, RVT

## 2023-04-15 NOTE — Progress Notes (Addendum)
Upon initial pt assessment during report, the patient was oriented to person and place. During the 2000 assessment, the pt was unarousable and her R pupil was a 2 and sluggish and her L pupil was a 3 and sluggish. This was a change from earlier when they were both 3 and sluggish. E-link was notified and a STAT head CT was ordered. Pt was taken to CT, will continue to monitor and assess the pt and notify of any additional changes.   Upon pt assessment, her L AC IV was infiltrated. Infusion stopped immediately and pharmacy was contacted. No new orders. IV was removed and will continue to monitor and assess the IV site. Placed IV consult. IV team was unable to gain access and suggested a PICC. Contacted e-link regarding PICC order. Will continue to monitor and assess the pt.

## 2023-04-15 NOTE — Progress Notes (Signed)
Patient tolerated extubation well.  She remains somnolent but will awaken and follow commands bilaterally.  Strength equal bilaterally.  Headache controlled.   Vital signs controlled.  Fluid status positive.  Urine output good.  Labs acceptable aside from some mild hypokalemia which is being replenished.  Ventriculostomy functioning well.  Follow-up head CT scan demonstrates continued large volume intraventricular hemorrhage.  Subarachnoid hemorrhage stable.  Ventriculostomy catheter in good position without evidence of progression of hydrocephalus.  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  Continue external ventricular drainage.

## 2023-04-15 NOTE — Progress Notes (Signed)
Inpatient Rehab Admissions Coordinator:   Per therapy recommendations, patient was screened for CIR candidacy by Megan Salon, MS, CCC-SLP. At this time, Pt. Is total A for bed mobility and has not attempted OOB. She is not at a level to tolerate the intensity of CIR at this time;however,  Pt. may have potential to progress to becoming a potential CIR candidate, so CIR admissions team will follow and monitor for progress and participation with therapies and place consult order if Pt. appears to be an appropriate candidate. Please contact me with any questions.  Megan Salon, MS, CCC-SLP Rehab Admissions Coordinator  [phone] (celll) [phone] (office)

## 2023-04-15 NOTE — Progress Notes (Signed)
Dear Doctor: This patient has been identified as a candidate for PICC for the following reason (s): IV therapy over 48 hours, drug pH or osmolality (causing phlebitis, infiltration in 24 hours), drug extravasation potential with tissue necrosis (KCL, Dilantin, Dopamine, CaCl, MgSO4, chemo vesicant), poor veins/poor circulatory system (CHF, COPD, emphysema, diabetes, steroid use, IV drug abuse, etc.), and incompatible drugs (aminophyllin, TPN, heparin, given with an antibiotic) If you agree, please write an order for the indicated device.  Thank you for supporting the early vascular access assessment program.

## 2023-04-15 NOTE — Progress Notes (Signed)
Arrived to unit. Patient leaving unit for head CT. Instructed nurse to reconsult when patient is back on unit and ready for IV team. VU. Tomasita Morrow, RN VAST

## 2023-04-15 NOTE — Evaluation (Signed)
Speech Language Pathology Evaluation Patient Details Name: Wendy Fleming MRN: [id number] DOB: 1940/11/22 Today's Date: 04/15/2023 Time: 0850-0909 SLP Time Calculation (min) (ACUTE ONLY): 19 min  Problem List:  Patient Active Problem List   Diagnosis Date Noted   ICH (intracerebral hemorrhage) (HCC) 04/12/2023   Past Medical History:  Past Medical History:  Diagnosis Date   Anemia    many years ago   Arthritis    Headache    rare   Hypertension    Past Surgical History:  Past Surgical History:  Procedure Laterality Date   ankle surgery for fracture Right 01-19-2014   COLONOSCOPY WITH PROPOFOL N/A 06/07/2014   Procedure: COLONOSCOPY WITH PROPOFOL;  Surgeon: Charolett Bumpers, MD;  Location: [facility];  Service: Endoscopy;  Laterality: N/A;   GALLBLADDER SURGERY     IR 3D INDEPENDENT WKST  04/12/2023   IR ANGIO VERTEBRAL SEL VERTEBRAL UNI L MOD SED  04/12/2023   IR ANGIOGRAM FOLLOW UP STUDY  04/12/2023   IR ANGIOGRAM FOLLOW UP STUDY  04/12/2023   IR ANGIOGRAM FOLLOW UP STUDY  04/12/2023   IR ANGIOGRAM FOLLOW UP STUDY  04/12/2023   IR ANGIOGRAM FOLLOW UP STUDY  04/12/2023   IR ANGIOGRAM FOLLOW UP STUDY  04/12/2023   IR ANGIOGRAM FOLLOW UP STUDY  04/12/2023   IR TRANSCATH/EMBOLIZ  04/12/2023   RADIOLOGY WITH ANESTHESIA N/A 04/12/2023   Procedure: IR WITH ANESTHESIA;  Surgeon: Lisbeth Renshaw, MD;  Location: [facility] OR;  Service: Radiology;  Laterality: N/A;   TUBAL LIGATION     HPI:  Pt is an 82 y.o. female who presented 04/12/23 after being found minimally responsive. 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 ETT 8/30 - 9/1. PMH: anemia, arthritis, HTN   Assessment / Plan / Recommendation Clinical Impression  Pt is lethargic this morning but did state her full name. She said a few single words and short phrases but with verbal expression limited overall. She inconsistently followed one-step  commands, but this and her sustained attention are likely impacted by decreased alertness. Family reports that PTA pt was living with her spouse and she was very independent. She will benefit from intensive SLP follow up.    SLP Assessment  SLP Recommendation/Assessment: Patient needs continued Speech Lanaguage Pathology Services SLP Visit Diagnosis: Cognitive communication deficit (R41.841)    Recommendations for follow up therapy are one component of a multi-disciplinary discharge planning process, led by the attending physician.  Recommendations may be updated based on patient status, additional functional criteria and insurance authorization.    Follow Up Recommendations  Acute inpatient rehab (3hours/day)    Assistance Recommended at Discharge  Frequent or constant Supervision/Assistance  Functional Status Assessment Patient has had a recent decline in their functional status and demonstrates the ability to make significant improvements in function in a reasonable and predictable amount of time.  Frequency and Duration min 2x/week  2 weeks      SLP Evaluation Cognition  Overall Cognitive Status: Impaired/Different from baseline Arousal/Alertness: Lethargic Orientation Level: Oriented to person;Disoriented to time Attention: Sustained Sustained Attention: Impaired Sustained Attention Impairment: Verbal basic;Functional basic Problem Solving: Impaired Problem Solving Impairment: Functional basic       Comprehension  Auditory Comprehension Overall Auditory Comprehension: Impaired Commands: Impaired One Step Basic Commands: 25-49% accurate Interfering Components: Attention    Expression Expression Primary Mode of Expression: Verbal Verbal Expression Overall Verbal Expression: Impaired Initiation: Impaired Automatic Speech: Name Level of Generative/Spontaneous Verbalization: Word;Phrase  Pragmatics: Impairment Impairments: Eye contact Interfering Components:  Attention Non-Verbal Means of Communication: Not applicable   Oral / Motor  Oral Motor/Sensory Function Overall Oral Motor/Sensory Function: Other (comment) (difficulty following commands to assess; did protrude tongue at midline) Motor Speech Overall Motor Speech:  (needs more assessment)            Mahala Menghini., M.A. CCC-SLP Acute Rehabilitation Services Office [phone]  Secure chat preferred  04/15/2023, 10:23 AM

## 2023-04-15 NOTE — Progress Notes (Signed)
Initial Nutrition Assessment  DOCUMENTATION CODES:   Obesity unspecified  INTERVENTION:   Initiate tube feeding via NG tube: Osmolite 1.5 @ 40 ml/h (960 ml per day) Prosource TF20 60 ml daily  Provides 1520 kcal, 80 gm protein, 729 ml free water daily  MVI daily  NUTRITION DIAGNOSIS:   Inadequate oral intake related to acute illness as evidenced by NPO status. Ongoing.   GOAL:   Patient will meet greater than or equal to 90% of their needs Progressing.   MONITOR:   Diet advancement, Vent status, Labs, Weight trends, TF tolerance  REASON FOR ASSESSMENT:   Consult Enteral/tube feeding initiation and management  ASSESSMENT:   Pt admitted after being found minimally responsive d/t ruptured Acom with developing hydrocephalus. No significant PMH listed.  Pt discussed during ICU rounds and with RN. Pt extubated, MD would like to resume TF via NG tube after pt failed swallow.  Pt will open eyes to voice but falls back asleep.   Medications reviewed and include: colace, nimotop, protonix  NS @ 100 ml/hr  Labs reviewed:  K 3.2 PO 3.1 - > 2.3 (received 30 mmol Kphos x 1 9/1) CBG's: 107-123  ICP: 171 ml   NG tube in place; tip in stomach per xray  NUTRITION - FOCUSED PHYSICAL EXAM:  Flowsheet Row Most Recent Value  Orbital Region No depletion  Upper Arm Region No depletion  Thoracic and Lumbar Region No depletion  Buccal Region No depletion  Temple Region No depletion  Clavicle Bone Region No depletion  Clavicle and Acromion Bone Region No depletion  Scapular Bone Region No depletion  Dorsal Hand No depletion  Patellar Region No depletion  Anterior Thigh Region No depletion  Posterior Calf Region No depletion  Edema (RD Assessment) None  Hair Reviewed  Eyes Reviewed  Mouth Unable to assess  Skin Reviewed  Nails Reviewed       Diet Order:   Diet Order             Diet NPO time specified  Diet effective now                   EDUCATION  NEEDS:   No education needs have been identified at this time  Skin:  Skin Assessment: Reviewed RN Assessment  Last BM:  418 ml documented via FMS  Height:   Ht Readings from Last 1 Encounters:  04/12/23 5\' 2"  (1.575 m)    Weight:   Wt Readings from Last 1 Encounters:  04/12/23 90.2 kg    Ideal Body Weight:  50 kg  BMI:  Body mass index is 36.37 kg/m.  Estimated Nutritional Needs:   Kcal:  1300-1500  Protein:  65-80g  Fluid:  >/=1.5L  Fadil Macmaster P., RD, LDN, CNSC See AMiON for contact information

## 2023-04-15 NOTE — Progress Notes (Signed)
   NAME:  Wendy Fleming, MRN:  [id number], DOB:  1941/07/14, LOS: 3 ADMISSION DATE:  04/12/2023, CONSULTATION DATE:  04/12/23 REFERRING MD:  NSGY, CHIEF COMPLAINT:  AMS   History of Present Illness:  82 year old woman w/ hx of HTN presented after husband found her down minimally responsive.  Vomited multiple times en route to ER.  Intubated for airway protection in ER.  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  NSGY to place EVD and determine best method of securing aneurysm.  PCCM to consult for vent management.  Hunt Hess 3/4 mFS 2 (thin, +IVH)  Pertinent  Medical History  HTN Anemia  Significant Hospital Events: Including procedures, antibiotic start and stop dates in addition to other pertinent events   8/30 admit 8/31 Tolerating SBT, able to follow simple commands  9/1 intermittent episodes of agitation overnight, bilateral wrist restraints added, extubated  Interim History / Subjective:   Has done well since extubation Afebrile CSF drain about 5 to 10 cc/h  Objective   Blood pressure 114/66, pulse 68, temperature 98 F (36.7 C), temperature source Oral, resp. rate (!) 26, height 5\' 2"  (1.575 m), weight 90.2 kg, SpO2 98%.        Intake/Output Summary (Last 24 hours) at 04/15/2023 0931 Last data filed at 04/15/2023 0900 Gross per 24 hour  Intake 2997.98 ml  Output 1195 ml  Net 1802.98 ml   Filed Weights   04/12/23 1000  Weight: 90.2 kg    Examination: General: Acute on chronic ill-appearing elderly female lying in bed in no acute distress HEENT: ETT, MM pink/moist, PERRL,  Neuro: Currently resting, moving all 4 extremities and following commands CV: s1s2 regular rate and rhythm, no murmur, rubs, or gallops,  PULM: Clear breath sounds bilateral, no accessory muscle use GI: soft, bowel sounds active in all 4 quadrants, non-tender, non-distended Extremities: warm/dry, no edema  Skin: no rashes or lesions   Labs 8/31 show hypokalemia, mild  hypophosphatemia, no leukocytosis  Resolved Hospital Problem list   N/A  Assessment & Plan:  Nontraumatic subarachnoid hemorrhage -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  HH 3/4, mFS 2 Head CT 9/2 shows slight improved ventriculomegaly, cytotoxic edema of anterior left basal ganglia consistent with ischemic infarct P: Management per neurosurgery Neurochecks Seizure precautions Aspiration precautions Nimodipine per neurosurgery Watch for vasospasm   Acute hypoxemic respiratory failure  -Due to encephalopathy from above as well as presumed aspiration, extubated 9/1 Aspiration pneumonia  P: Continue empiric Zosyn   Hypokalemia P: Check BMET today  Dysphagia -ice chips  Best Practice (right click and "Reselect all SmartList Selections" daily)   Diet/type: NPO  DVT prophylaxis: SCD GI prophylaxis: PPI Lines: N/A Foley:  Yes, and it is still needed Code Status:  full code Last date of multidisciplinary goals of care discussion [per primary]  Critical care time:    Performed by: Comer Locket Jodene Nam MD. Tonny Bollman. [facility] Pager : [phone]  If no response to pager , please call [phone] until 7 pm After 7:00 pm call Elink  [phone]     04/15/2023, 9:31 AM

## 2023-04-15 NOTE — Progress Notes (Signed)
Received consult for PIV placement. Upon assessment of bilateral arms, no appropriate vein found. Notified nurse to contact MD requesting PICC/CVC. VU. Tomasita Morrow, RN VAST

## 2023-04-15 NOTE — Progress Notes (Addendum)
eLink Physician-Brief Progress Note [name] DOB: [birth year] MRN: [id number]   Date of Service  04/15/2023  HPI/Events of Note  82 year old female that presented with a nontraumatic subarachnoid hemorrhage and subsequent respiratory failure requiring intubation and was extubated on 9/1.  Patient is following commands but appears more lethargic and mental status has been waxing/waning.  New anisocoria on bedside examination-left 3 mm sluggish, 2 right millimeters and sluggish  CT of the head was completed this morning with appropriately coiled aneurysm.  Persistent moderate to large volume IVH and right frontal EVD with improved ventriculomegaly.  Patient has had waxing and waning mental status but it improved by the time I came into the room.  Able to answer questions appropriately.  EVD has remained stable at 10 with roughly 7 to 10 cc of drainage per hour.  Appears more bloody today compared to yesterday per staff observation.  BPs have been stable overall systolic 130-150.  eICU Interventions  Will repeat CT head to ensure no EVD dysfunction or new bleeding.  Unclear whether the patient has having vasospasms although already on pneumonia pain.  Could discuss with neurosurgery about elevating BP goals pending CT findings   2320 -IV team unable to get adequate IV access.  Recommending PICC line.  Will order.  CT head results with minimal overall change.  Reduced hydrocephalus in comparison but no evidence of acute changes to explain changes in mental status.  Continue to monitor for now.  Intervention Category Intermediate Interventions: Change in mental status - evaluation and management  [name] 04/15/2023, 8:27 PM

## 2023-04-16 ENCOUNTER — Other Ambulatory Visit: Payer: Self-pay

## 2023-04-16 ENCOUNTER — Inpatient Hospital Stay (HOSPITAL_COMMUNITY): Payer: Medicare HMO

## 2023-04-16 DIAGNOSIS — I609 Nontraumatic subarachnoid hemorrhage, unspecified: Secondary | ICD-10-CM | POA: Diagnosis not present

## 2023-04-16 DIAGNOSIS — G9340 Encephalopathy, unspecified: Secondary | ICD-10-CM | POA: Diagnosis not present

## 2023-04-16 DIAGNOSIS — R4182 Altered mental status, unspecified: Secondary | ICD-10-CM

## 2023-04-16 LAB — POCT I-STAT 7, (LYTES, BLD GAS, ICA,H+H)
Acid-Base Excess: 0 mmol/L (ref 0.0–2.0)
Bicarbonate: 23.4 mmol/L (ref 20.0–28.0)
Calcium, Ion: 1.21 mmol/L (ref 1.15–1.40)
HCT: 34 % — ABNORMAL LOW (ref 36.0–46.0)
Hemoglobin: 11.6 g/dL — ABNORMAL LOW (ref 12.0–15.0)
O2 Saturation: 97 %
Patient temperature: 98.1
Potassium: 3.8 mmol/L (ref 3.5–5.1)
Sodium: 137 mmol/L (ref 135–145)
TCO2: 24 mmol/L (ref 22–32)
pCO2 arterial: 33.3 mmHg (ref 32–48)
pH, Arterial: 7.454 — ABNORMAL HIGH (ref 7.35–7.45)
pO2, Arterial: 86 mmHg (ref 83–108)

## 2023-04-16 LAB — BASIC METABOLIC PANEL
Anion gap: 11 (ref 5–15)
BUN: 8 mg/dL (ref 8–23)
CO2: 24 mmol/L (ref 22–32)
Calcium: 8.8 mg/dL — ABNORMAL LOW (ref 8.9–10.3)
Chloride: 101 mmol/L (ref 98–111)
Creatinine, Ser: 0.6 mg/dL (ref 0.44–1.00)
GFR, Estimated: >60 mL/min (ref >60)
Glucose, Bld: 137 mg/dL — ABNORMAL HIGH (ref 70–99)
Potassium: 2.8 mmol/L — ABNORMAL LOW (ref 3.5–5.1)
Sodium: 136 mmol/L (ref 135–145)

## 2023-04-16 LAB — GLUCOSE, CAPILLARY
Glucose-Capillary: 114 mg/dL — ABNORMAL HIGH (ref 70–99)
Glucose-Capillary: 118 mg/dL — ABNORMAL HIGH (ref 70–99)
Glucose-Capillary: 129 mg/dL — ABNORMAL HIGH (ref 70–99)
Glucose-Capillary: 131 mg/dL — ABNORMAL HIGH (ref 70–99)
Glucose-Capillary: 135 mg/dL — ABNORMAL HIGH (ref 70–99)
Glucose-Capillary: 144 mg/dL — ABNORMAL HIGH (ref 70–99)

## 2023-04-16 LAB — CBC WITH DIFFERENTIAL/PLATELET
Abs Immature Granulocytes: 0.08 10*3/uL — ABNORMAL HIGH (ref 0.00–0.07)
Basophils Absolute: 0.1 10*3/uL (ref 0.0–0.1)
Basophils Relative: 1 %
Eosinophils Absolute: 0.1 10*3/uL (ref 0.0–0.5)
Eosinophils Relative: 1 %
HCT: 35.9 % — ABNORMAL LOW (ref 36.0–46.0)
Hemoglobin: 11.7 g/dL — ABNORMAL LOW (ref 12.0–15.0)
Immature Granulocytes: 1 %
Lymphocytes Relative: 18 %
Lymphs Abs: 1.6 10*3/uL (ref 0.7–4.0)
MCH: 29.1 pg (ref 26.0–34.0)
MCHC: 32.6 g/dL (ref 30.0–36.0)
MCV: 89.3 fL (ref 80.0–100.0)
Monocytes Absolute: 0.9 10*3/uL (ref 0.1–1.0)
Monocytes Relative: 10 %
Neutro Abs: 6.2 10*3/uL (ref 1.7–7.7)
Neutrophils Relative %: 69 %
Platelets: 202 10*3/uL (ref 150–400)
RBC: 4.02 MIL/uL (ref 3.87–5.11)
RDW: 13.8 % (ref 11.5–15.5)
WBC: 8.9 10*3/uL (ref 4.0–10.5)
nRBC: 0 % (ref 0.0–0.2)

## 2023-04-16 LAB — MAGNESIUM: Magnesium: 2 mg/dL (ref 1.7–2.4)

## 2023-04-16 LAB — PHOSPHORUS: Phosphorus: 2 mg/dL — ABNORMAL LOW (ref 2.5–4.6)

## 2023-04-16 MED ORDER — SODIUM CHLORIDE 0.9% FLUSH
10.0000 mL | Freq: Two times a day (BID) | INTRAVENOUS | Status: DC
  Administered 2023-04-16: 10 mL
  Administered 2023-04-16: 30 mL
  Administered 2023-04-17 – 2023-04-19 (×5): 10 mL
  Administered 2023-04-20: 30 mL
  Administered 2023-04-20 – 2023-04-23 (×7): 10 mL
  Administered 2023-04-24: 30 mL
  Administered 2023-04-24 – 2023-04-25 (×3): 10 mL
  Administered 2023-04-26: 30 mL
  Administered 2023-04-26: 20 mL
  Administered 2023-04-27 – 2023-04-29 (×6): 10 mL
  Administered 2023-04-30: 30 mL
  Administered 2023-05-01 – 2023-05-05 (×8): 10 mL
  Administered 2023-05-05: 20 mL
  Administered 2023-05-06 – 2023-05-18 (×21): 10 mL

## 2023-04-16 MED ORDER — SODIUM CHLORIDE 0.9% FLUSH
10.0000 mL | INTRAVENOUS | Status: DC | PRN
  Administered 2023-05-09: 20 mL

## 2023-04-16 MED ORDER — POTASSIUM PHOSPHATES 15 MMOLE/5ML IV SOLN
30.0000 mmol | Freq: Once | INTRAVENOUS | Status: AC
  Administered 2023-04-16: 30 mmol via INTRAVENOUS
  Filled 2023-04-16: qty 10

## 2023-04-16 MED ORDER — POTASSIUM CHLORIDE 20 MEQ PO PACK
60.0000 meq | PACK | Freq: Once | ORAL | Status: AC
  Administered 2023-04-16: 60 meq
  Filled 2023-04-16: qty 3

## 2023-04-16 MED ORDER — SODIUM PHOSPHATES 45 MMOLE/15ML IV SOLN: 30.0000 mmol | Freq: Once | INTRAVENOUS | Status: DC

## 2023-04-16 NOTE — Progress Notes (Signed)
  NEUROSURGERY PROGRESS NOTE   Pt seen and examined. No issues overnight. Pt reports minimal HA.  EXAM: Temp:  [98 F (36.7 C)-98.8 F (37.1 C)] 98.1 F (36.7 C) (09/03 0800) Pulse Rate:  [59-74] 68 (09/03 0900) Resp:  [15-27] 25 (09/03 0900) BP: (116-160)/(57-111) 150/85 (09/03 0900) SpO2:  [96 %-100 %] 100 % (09/03 0900) Weight:  [87.7 kg] 87.7 kg (09/03 0353) Intake/Output      09/02 0701 09/03 0700 09/03 0701 09/04 0700   I.V. (mL/kg) 2392.1 (27.3) 100 (1.1)   Other 30    NG/GT 724.7 40   IV Piggyback 542.2    Total Intake(mL/kg) 3688.9 (42.1) 140 (1.6)   Urine (mL/kg/hr) 2000 (1)    Drains 154 21   Stool 408    Total Output 2562 21   Net +1126.9 +119        Urine Occurrence 2 x    Stool Occurrence 1 x     Awake, alert, oriented to person, not place or year CN grossly intact Speech dysarthric Follows commands all extremities, at least antigravity but generalized weakness EVD in place, patent  LABS: Lab Results  Component Value Date   CREATININE 0.60 04/16/2023   BUN 8 04/16/2023   NA 136 04/16/2023   K 2.8 (L) 04/16/2023   CL 101 04/16/2023   CO2 24 04/16/2023   Lab Results  Component Value Date   WBC 8.9 04/16/2023   HGB 11.7 (L) 04/16/2023   HCT 35.9 (L) 04/16/2023   MCV 89.3 04/16/2023   PLT 202 04/16/2023    TCD: Date POD PCO2 HCT BP   MCA ACA PCA OPHT SIPH VERT Basilar  8/31 CK         Right  Left   *  *   *  *   *  31   14  18    *  18   -24  -16   *       9/2 RH         Right  Left   43  28   *  *   *  *   11  10   36  -20   -29  -11   -22        IMAGING: CTH reviewed, unchanged IVH and SAH. No acute changes.  IMPRESSION: - 82 y.o. female SAH d# 5 s/p coil embolization Acom aneurysm and EVD for associated HCP, neurologically stable, certainly improved from presentation.  PLAN: - Cont supportive care - Cont Nimotop - Permissive HTN - PT/OT/SLP eval    Lisbeth Renshaw, MD [facility]

## 2023-04-16 NOTE — Progress Notes (Signed)
Wedding ring was removed from Left hand due to swelling.  Daughter was notfied.  Ring was given to spouse, Sam, when he arrived.

## 2023-04-16 NOTE — Progress Notes (Signed)
Peripherally Inserted Central Catheter Placement  The IV Nurse has discussed with the patient and/or persons authorized to consent for the patient, the purpose of this procedure and the potential benefits and risks involved with this procedure.  The benefits include less needle sticks, lab draws from the catheter, and the patient may be discharged home with the catheter. Risks include, but not limited to, infection, bleeding, blood clot (thrombus formation), and puncture of an artery; nerve damage and irregular heartbeat and possibility to perform a PICC exchange if needed/ordered by physician.  Alternatives to this procedure were also discussed.  Bard Power PICC patient education guide, fact sheet on infection prevention and patient information card has been provided to patient /or left at bedside.    PICC Placement Documentation  PICC Triple Lumen 04/16/23 Right Basilic 39 cm 1 cm (Active)  Indication for Insertion or Continuance of Line Vasoactive infusions 04/16/23 1000  Exposed Catheter (cm) 1 cm 04/16/23 1000  Site Assessment Clean, Dry, Intact 04/16/23 1000  Lumen #1 Status Flushed;Blood return noted;Saline locked 04/16/23 1000  Lumen #2 Status Flushed;Blood return noted;Saline locked 04/16/23 1000  Lumen #3 Status Flushed;Blood return noted;Saline locked 04/16/23 1000  Dressing Type Transparent 04/16/23 1000  Dressing Status Antimicrobial disc in place 04/16/23 1000  Line Care Connections checked and tightened 04/16/23 1000  Line Adjustment (NICU/IV Team Only) No 04/16/23 1000  Dressing Intervention New dressing 04/16/23 1000  Dressing Change Due 04/23/23 04/16/23 1000       Wendy Fleming 04/16/2023, 10:14 AM

## 2023-04-16 NOTE — Progress Notes (Signed)
   NAME:  Wendy Fleming, MRN:  [id number], DOB:  Mar 22, 1941, LOS: 4 ADMISSION DATE:  04/12/2023, CONSULTATION DATE:  04/12/23 REFERRING MD:  NSGY, CHIEF COMPLAINT:  AMS   History of Present Illness:  82 year old woman w/ hx of HTN presented after husband found her down minimally responsive.  Vomited multiple times en route to ER.  Intubated for airway protection in ER.  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  NSGY to place EVD and determine best method of securing aneurysm.  PCCM to consult for vent management.  [facility] 3/4 mFS 2 (thin, +IVH)  Pertinent  Medical History  HTN Anemia  Significant Hospital Events: Including procedures, antibiotic start and stop dates in addition to other pertinent events   8/30 EVD placement in ER, admit Coil embolization Left Acom aneurysm  8/31 Tolerating SBT, able to follow simple commands  9/1 intermittent episodes of agitation overnight, bilateral wrist restraints added, extubated 9/2 TCD remaining neg for vasospasm   Interim History / Subjective:  Reports head ache  no distress   Objective   Blood pressure (!) 157/81, pulse 74, temperature 98.3 F (36.8 C), temperature source Axillary, resp. rate (!) 23, height 5\' 2"  (1.575 m), weight 87.7 kg, SpO2 100%.        Intake/Output Summary (Last 24 hours) at 04/16/2023 0720 Last data filed at 04/16/2023 0700 Gross per 24 hour  Intake 3688.92 ml  Output 2556 ml  Net 1132.92 ml   Filed Weights   04/12/23 1000 04/16/23 0353  Weight: 90.2 kg 87.7 kg    Examination:  General 82 year old female resting in bed. No acute distress HENT NCAT PERRL. CSF from EVD clear  Pulm clear currently on 3 lpm Card rrr Abd soft Ext warm and dry  Neuro oriented x 1.  Then at times seems to have some aphasia. Right side weak. 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  S/p coil embolization and EVD placement 8/30  HH 3/4, mFS 2 Head CT 9/2 shows slight improved ventriculomegaly, cytotoxic edema of anterior left basal ganglia consistent with ischemic infarct Plan Management per neurosurgery Cont serial neuro checks Seizure precautions Aspiration precautions Cont nimodipine  Scheduled serial TCDs; watch for neuro decline and evidence of vasospasm  EVD per neuro surgical team  Avoid fevers Keep euglycemic  PT/OT  Acute hypoxemic respiratory failure due to Aspiration pneumonia  Extubated 9/1, sats 100% on  3 lpm  Plan Repeat PCXR today to f/u PNA Wean oxygen  Aspiration precautions Pulm hygiene  Day 5 ceftriaxone w/ today being last dose    Fluid and Electrolyte imbalance: intermittent  Plan  Intermittent check and replace as indicated.   Dysphagia -evaluated by SLP.  Plan Cont tube feeds Ice chips after oral care.  Plan to do more objective study when Mental status improves.  Best Practice (right click and "Reselect all SmartList Selections" daily)   Diet/type: NPO -->tubefeeds DVT prophylaxis: SCD-->Gilbert heparin started 9/1 GI prophylaxis: PPI Lines: N/A Foley:  Yes, and it is still needed Code Status:  full code Last date of multidisciplinary goals of care discussion [per primary]  Critical care time: NA   Simonne Martinet ACNP-BC [facility] Pulmonary/Critical Care Pager # [phone] OR # [phone] if no answer   04/16/2023, 7:20 AM

## 2023-04-16 NOTE — Progress Notes (Signed)
Occupational Therapy Treatment Patient Details Name: Wendy Fleming MRN: [id number] DOB: 12-10-1940 Today's Date: 04/16/2023   History of present illness Pt is an 82 y.o. female who presented 04/12/23 after being found minimally responsive. 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 ETT 8/30 - 9/1. PMH: anemia, arthritis, HTN   OT comments  Pt seen as co-treat with PT to increase mobility, Significant deficits with initiation of movement however once seated EOB, able to sustain midline postural control with CGA to S. While EOB assisted with oral care using a toothette and stated "that feels good". Was able to identify her daughter in a picture and demonstrate increased periods of sustained attention to tasks. +2 assist was used to mobilize [place] the the chair. She stated it "felt good" to be out of bed. Improved ability to participate with therapy, especially once upright. Hopeful pt will continue to improve and benefit from intensive inpatient follow up therapy, >3 hours/day. VSS on 3L. [place]efet message for daughter via phone regarding progress with therapy. Acute OT will continue to follow.        If plan is discharge home, recommend the following:  Assist for transportation;Assistance with cooking/housework;Two people to help with walking and/or transfers;A lot of help with bathing/dressing/bathroom;Direct supervision/assist for medications management;Direct supervision/assist for financial management   Equipment Recommendations  None recommended by OT    Recommendations for Other Services Rehab consult    Precautions / Restrictions Precautions Precautions: Fall;Other (comment) Precaution Comments: EVD (clamp for sessions); rectal foley       Mobility Bed Mobility Overal bed mobility: Needs Assistance Bed Mobility: Supine to Sit     Supine to sit: Total assist, +2 for physical assistance      General bed mobility comments: Diffiuclty initiating movement    Transfers Overall transfer level: Needs assistance Equipment used: 2 person hand held assist Transfers: Sit to/from Stand Sit to Stand: +2 physical assistance, Max assist           General transfer comment: ablet o stand using +2HHA wit hbed pad to facilitate hips foward. Once standing no buckling of knees noted; unable to initiate steps; returned to sitting and Stedy used to [employer] to Insurance risk surveyor via Lift Equipment: [employer] Overall balance assessment: Needs assistance   Sitting balance-Leahy Scale: Fair Sitting balance - Comments: able to sit unsupported with close supervision     Standing balance-Leahy Scale: Poor                             ADL either performed or assessed with clinical judgement   ADL Overall ADL's : Needs assistance/impaired     Grooming: Oral care;Moderate assistance Grooming Details (indicate cue type and reason): able to use toothette to clean mouth; wiped mouth with wet cloth after brushing teeth                             Functional mobility during ADLs: Maximal assistance;+2 for physical assistance      Extremity/Trunk Assessment Upper Extremity Assessment Upper Extremity Assessment: RUE deficits/detail;LUE deficits/detail RUE Deficits / Details: moving RUE less than L however initiating movements LUE Deficits / Details: using functionally to help hold toothette for brushing teeth, hodling pistures; generalized weakness but overall stronger than RUE   Lower Extremity Assessment Lower Extremity Assessment: Defer to PT evaluation  Vision   Vision Assessment?: Vision impaired- to be further tested in functional context Additional Comments: decreased visaul attention; tracking therapist; attending to pictures   Perception Perception Perception: Impaired   Praxis Praxis Praxis: Impaired    Cognition Arousal: Alert,  Lethargic Behavior During Therapy: Flat affect Overall Cognitive Status: Impaired/Different from baseline Area of Impairment: Orientation, Attention, Memory, Following commands, Safety/judgement, Awareness, Problem solving                 Orientation Level: Disoriented to, Time, Situation, Place Current Attention Level: Sustained (moments of sustained attention today)   Following Commands: Follows one step commands inconsistently, Follows one step commands with increased time Safety/Judgement: Decreased awareness of safety, Decreased awareness of deficits Awareness: Intellectual Problem Solving: Decreased initiation, Slow processing, Difficulty sequencing, Requires verbal cues, Requires tactile cues General Comments: backward chaining works well        Exercises      Shoulder Instructions       General Comments      Pertinent Vitals/ Pain       Pain Assessment Pain Assessment: No/denies pain  Home Living                                          Prior Functioning/Environment              Frequency  Min 1X/week        Progress Toward Goals  OT Goals(current goals can now be found in the care plan section)  Progress towards OT goals: Progressing toward goals;OT to reassess next treatment  Acute Rehab OT Goals OT Goal Formulation: Patient unable to participate in goal setting Time For Goal Achievement: 04/29/23 Potential to Achieve Goals: Good ADL Goals Pt Will Perform Grooming: with min assist;sitting Additional ADL Goal #1: Patient will sit EOB for up to 10 min with CGA to increase ADL tolerance Additional ADL Goal #2: Patient will follow one step command 50% of the time with Min VC's for ADL activity  Plan      Co-evaluation    PT/OT/SLP Co-Evaluation/Treatment: Yes Reason for Co-Treatment: Complexity of the patient's impairments (multi-system involvement);Necessary to address cognition/behavior during functional activity;For  patient/therapist safety;To address functional/ADL transfers   OT goals addressed during session: ADL's and self-care      AM-PAC OT "6 Clicks" Daily Activity     Outcome Measure   Help from another person eating meals?: Total Help from another person taking care of personal grooming?: A Lot Help from another person toileting, which includes using toliet, bedpan, or urinal?: Total Help from another person bathing (including washing, rinsing, drying)?: Total Help from another person to put on and taking off regular upper body clothing?: Total Help from another person to put on and taking off regular lower body clothing?: Total 6 Click Score: 7    End of Session Equipment Utilized During Treatment: Gait belt  OT Visit Diagnosis: Muscle weakness (generalized) (M62.81);Hemiplegia and hemiparesis;Other symptoms and signs involving cognitive function;Other symptoms and signs involving the nervous system (R29.898);Other abnormalities of gait and mobility (R26.89) Hemiplegia - Right/Left: Right Hemiplegia - dominant/non-dominant: Dominant Hemiplegia - caused by: Nontraumatic intracerebral hemorrhage   Activity Tolerance Patient tolerated treatment well   Patient Left in chair;with call bell/phone within reach;with chair alarm set;Other (comment) (Maxisky pad in chair)   Nurse Communication Need for lift equipment;Mobility status;Other (comment) (Need to assess EVD position/clamp)  Time: 1444-1530 OT Time Calculation (min): 46 min  Charges: OT General Charges $OT Visit: 1 Visit OT Treatments $Self Care/Home Management : 8-22 mins  Luisa Dago, OT/L   Acute OT Clinical Specialist Acute Rehabilitation Services Pager [phone] Office [phone]   [facility] 04/16/2023, 6:48 PM

## 2023-04-16 NOTE — Progress Notes (Signed)
Speech Language Pathology Treatment: Dysphagia;Cognitive-Linquistic  Patient Details Name: Wendy Fleming MRN: [id number] DOB: 1940-08-19 Today's Date: 04/16/2023 Time: 0347-4259 SLP Time Calculation (min) (ACUTE ONLY): 30 min  Assessment / Plan / Recommendation Clinical Impression  Pt was drowsy this afternoon but able to awaken with stimulation. Verbal expression is still limited, but she said more than she did on previous date, including her name and a few short phrases. She sustained attention to pictures of her great grandchildren, identifying her great grandson by name and stating his relationship to her. She followed several one-step commands with Min cues.   While alert, SLP provided trials of ice chips and thin liquids via spoon. She has good acceptance of boluses today and begins mastication of ice chips promptly. She swallows consistently with only occasional throat clearing and a single cough noted. Recommend continuing to provide ice chips after oral care during moments of increased alertness. Need for instrumental testing will be determined as mentation continues to improve.    HPI HPI: Pt is an 82 y.o. female who presented 04/12/23 after being found minimally responsive. 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 ETT 8/30 - 9/1. PMH: anemia, arthritis, HTN      SLP Plan  Continue with current plan of care      Recommendations for follow up therapy are one component of a multi-disciplinary discharge planning process, led by the attending physician.  Recommendations may be updated based on patient status, additional functional criteria and insurance authorization.    Recommendations  Diet recommendations: NPO;Other(comment) (ice chips if fully alert) Medication Administration: Via alternative means                  Oral care QID   Frequent or constant  Supervision/Assistance Cognitive communication deficit (R41.841);Dysphagia, unspecified (R13.10)     Continue with current plan of care     Mahala Menghini., M.A. CCC-SLP Acute Rehabilitation Services Office [phone]  Secure chat preferred   04/16/2023, 1:42 PM

## 2023-04-16 NOTE — Progress Notes (Signed)
Physical Therapy Treatment Patient Details Name: Wendy Fleming MRN: [id number] DOB: [birth year] Today's Date: 04/16/2023   History of Present Illness Pt is an 82 y.o. female who presented 04/12/23 after being found minimally responsive. 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 ETT 8/30 - 9/1. PMH: anemia, arthritis, HTN    PT Comments  Patient with increased alertness today though waxing and required frequent/constant auditory stimulation to remain alert. Patient required Total Assist to move with use of bed pad supine>sit EOB, once EOB pt initially required max assist for balance but with repositioning able to maintain with close CGA. Pt completed sit<>stand with 2HHA and Mod assist to stabilize, manual blocking required on Lt knee to prevent buckling. Stedy utilized for [employer] transfer with Mod+2 for safety/physical support. Once OOB pt stating "feels good" to be up and agreeable to remain in bed. Continue to recommend intense rehab follow up >3 hours per day. Will progress as able.   If plan is discharge home, recommend the following: Two people to help with walking and/or transfers;Two people to help with bathing/dressing/bathroom;Assistance with cooking/housework;Direct supervision/assist for medications management;Direct supervision/assist for financial management;Assist for transportation;Help with stairs or ramp for entrance   Can travel by private vehicle        Equipment Recommendations  Other (comment) (defer to next venue of care)    Recommendations for Other Services Rehab consult     Precautions / Restrictions Precautions Precautions: Fall;Other (comment) Precaution Comments: EVD (clamp for sessions); rectal foley Restrictions Weight Bearing Restrictions: No     Mobility  Bed Mobility Overal bed mobility: Needs Assistance Bed Mobility: Supine to Sit     Supine to sit:  Total assist, +2 for physical assistance     General bed mobility comments: Diffiuclty initiating movement    Transfers Overall transfer level: Needs assistance Equipment used: 2 person hand held assist Transfers: Sit to/from Stand Sit to Stand: +2 physical assistance, Max assist           General transfer comment: ablet o stand using +2HHA wit hbed pad to facilitate hips foward. Once standing no buckling of knees noted; unable to initiate steps; returned to sitting and Stedy used to [employer] to Insurance risk surveyor via Lift Equipment: Stedy  Ambulation/Gait                   Stairs             Wheelchair Mobility     Tilt Bed    Modified Rankin (Stroke Patients Only)       Balance Overall balance assessment: Needs assistance   Sitting balance-Leahy Scale: Fair Sitting balance - Comments: able to sit unsupported with CGA   Standing balance support: Bilateral upper extremity supported Standing balance-Leahy Scale: Poor                              Cognition Arousal: Alert, Lethargic Behavior During Therapy: Flat affect Overall Cognitive Status: Impaired/Different from baseline Area of Impairment: Orientation, Attention, Memory, Following commands, Safety/judgement, Awareness, Problem solving                 Orientation Level: Disoriented to, Time, Situation, Place Current Attention Level: Sustained (moments of sustained attention today)   Following Commands: Follows one step commands inconsistently, Follows one step commands with increased time Safety/Judgement: Decreased awareness of safety, Decreased awareness of deficits Awareness: Intellectual Problem Solving:  Decreased initiation, Slow processing, Difficulty sequencing, Requires verbal cues, Requires tactile cues General Comments: backward chaining works well        Exercises      General Comments        Pertinent Vitals/Pain Pain Assessment Pain Assessment: No/denies  pain    Home Living                          Prior Function            PT Goals (current goals can now be found in the care plan section) Acute Rehab PT Goals PT Goal Formulation: Patient unable to participate in goal setting Time For Goal Achievement: 04/28/23 Potential to Achieve Goals: Good Progress towards PT goals: Progressing toward goals    Frequency    Min 1X/week      PT Plan      Co-evaluation PT/OT/SLP Co-Evaluation/Treatment: Yes Reason for Co-Treatment: Complexity of the patient's impairments (multi-system involvement);Necessary to address cognition/behavior during functional activity;For patient/therapist safety;To address functional/ADL transfers PT goals addressed during session: Mobility/safety with mobility;Balance OT goals addressed during session: ADL's and self-care      AM-PAC PT "6 Clicks" Mobility   Outcome Measure  Help needed turning from your back to your side while in a flat bed without using bedrails?: Total Help needed moving from lying on your back to sitting on the side of a flat bed without using bedrails?: Total Help needed moving to and from a bed to a chair (including a wheelchair)?: Total Help needed standing up from a chair using your arms (e.g., wheelchair or bedside chair)?: Total Help needed to walk in hospital room?: Total Help needed climbing 3-5 steps with a railing? : Total 6 Click Score: 6    End of Session Equipment Utilized During Treatment: Gait belt;Oxygen Activity Tolerance: Patient tolerated treatment well Patient left: in chair;with call bell/phone within reach;with chair alarm set Nurse Communication: Mobility status (to clamp EVD for session; vitals throughout) PT Visit Diagnosis: Muscle weakness (generalized) (M62.81);Difficulty in walking, not elsewhere classified (R26.2);Other symptoms and signs involving the nervous system (R29.898)     Time: 5956-3875 PT Time Calculation (min) (ACUTE ONLY): 47  min  Charges:    $Therapeutic Activity: 23-37 mins PT General Charges $$ ACUTE PT VISIT: 1 Visit                     Wynn Maudlin, DPT Acute Rehabilitation Services Office [phone]  04/16/23 7:03 PM

## 2023-04-17 ENCOUNTER — Inpatient Hospital Stay (HOSPITAL_COMMUNITY): Payer: Medicare HMO

## 2023-04-17 DIAGNOSIS — I609 Nontraumatic subarachnoid hemorrhage, unspecified: Secondary | ICD-10-CM | POA: Diagnosis not present

## 2023-04-17 DIAGNOSIS — R4182 Altered mental status, unspecified: Secondary | ICD-10-CM | POA: Diagnosis not present

## 2023-04-17 LAB — GLUCOSE, CAPILLARY
Glucose-Capillary: 114 mg/dL — ABNORMAL HIGH (ref 70–99)
Glucose-Capillary: 123 mg/dL — ABNORMAL HIGH (ref 70–99)
Glucose-Capillary: 127 mg/dL — ABNORMAL HIGH (ref 70–99)
Glucose-Capillary: 129 mg/dL — ABNORMAL HIGH (ref 70–99)
Glucose-Capillary: 134 mg/dL — ABNORMAL HIGH (ref 70–99)
Glucose-Capillary: 146 mg/dL — ABNORMAL HIGH (ref 70–99)

## 2023-04-17 LAB — BASIC METABOLIC PANEL
Anion gap: 9 (ref 5–15)
BUN: 10 mg/dL (ref 8–23)
CO2: 24 mmol/L (ref 22–32)
Calcium: 9 mg/dL (ref 8.9–10.3)
Chloride: 102 mmol/L (ref 98–111)
Creatinine, Ser: 0.51 mg/dL (ref 0.44–1.00)
GFR, Estimated: >60 mL/min (ref >60)
Glucose, Bld: 135 mg/dL — ABNORMAL HIGH (ref 70–99)
Potassium: 3.5 mmol/L (ref 3.5–5.1)
Sodium: 135 mmol/L (ref 135–145)

## 2023-04-17 LAB — MAGNESIUM: Magnesium: 2 mg/dL (ref 1.7–2.4)

## 2023-04-17 LAB — PHOSPHORUS: Phosphorus: 2.4 mg/dL — ABNORMAL LOW (ref 2.5–4.6)

## 2023-04-17 MED ORDER — POTASSIUM CHLORIDE 20 MEQ PO PACK: 40.0000 meq | PACK | Freq: Once | ORAL | Status: DC

## 2023-04-17 MED ORDER — LEVETIRACETAM 500 MG PO TABS
500.0000 mg | ORAL_TABLET | Freq: Two times a day (BID) | ORAL | Status: DC
  Administered 2023-04-17 – 2023-04-24 (×16): 500 mg
  Filled 2023-04-17 (×16): qty 1

## 2023-04-17 MED ORDER — POTASSIUM PHOSPHATES 15 MMOLE/5ML IV SOLN
15.0000 mmol | Freq: Once | INTRAVENOUS | Status: AC
  Administered 2023-04-17: 15 mmol via INTRAVENOUS
  Filled 2023-04-17: qty 5

## 2023-04-17 MED ORDER — HYDRALAZINE HCL 10 MG PO TABS: 10.0000 mg | ORAL_TABLET | Freq: Four times a day (QID) | ORAL | Status: DC | PRN

## 2023-04-17 NOTE — Progress Notes (Signed)
Physical Therapy Treatment Patient Details Name: Wendy Fleming MRN: [id number] DOB: 1941/07/10 Today's Date: 04/17/2023   History of Present Illness Pt is an 82 y.o. female who presented 04/12/23 after being found minimally responsive. 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 ETT 8/30 - 9/1. PMH: anemia, arthritis, HTN    PT Comments  Patient more lethargic this date and did not arouse to auditory or tactile stimuli in supine. Patient required Total Assist +2 to pivot hips/LEs to sit up EOB. Once upright pt's eyes opened and more alert however pt unable to verbalize any responses to therapist today or follow cues/commands to reach Ue's or kick LE's. Attempted activation of trunk for anterior lean as pt tending to rest posteriorly on +2 help. Pt fatiguing more quickly and Total Assist to return to supine; pt drifting to sleep fairly quickly once back in supine. Will continue to progress as able and recommend intense rehab follow up with goal/hope for improved activity tolerance.   If plan is discharge home, recommend the following: Two people to help with walking and/or transfers;Two people to help with bathing/dressing/bathroom;Assistance with cooking/housework;Direct supervision/assist for medications management;Direct supervision/assist for financial management;Assist for transportation;Help with stairs or ramp for entrance   Can travel by private vehicle        Equipment Recommendations  Other (comment) (defer to next venue of care)    Recommendations for Other Services Rehab consult     Precautions / Restrictions Precautions Precautions: Fall;Other (comment) Precaution Comments: EVD (clamp for sessions); rectal foley Restrictions Weight Bearing Restrictions: No     Mobility  Bed Mobility Overal bed mobility: Needs Assistance Bed Mobility: Supine to Sit, Sit to Supine     Supine to  sit: Total assist, +2 for physical assistance, +2 for safety/equipment Sit to supine: Total assist, +2 for physical assistance, +2 for safety/equipment   General bed mobility comments: Diffiuclty initiating movement. pt obtunded at start of session alerting to noxious stimuli of sternal rub and at nail bed. Pt required Total Assist with bed pad to pivot hips/trunk and sit up EOB. More alert EOB and eyes open. pt unable to atneriorly weight shift to maintain balance sitting. pt not following cues/commands for UE/LE movements today.    Transfers                   General transfer comment: pt unable due to lethargy/fatigue today    Ambulation/Gait                   Stairs             Wheelchair Mobility     Tilt Bed    Modified Rankin (Stroke Patients Only)       Balance Overall balance assessment: Needs assistance   Sitting balance-Leahy Scale: Fair Sitting balance - Comments: able to sit unsupported with CGA   Standing balance support: Bilateral upper extremity supported Standing balance-Leahy Scale: Poor                              Cognition Arousal: Lethargic, Obtunded Behavior During Therapy: Flat affect Overall Cognitive Status: Impaired/Different from baseline Area of Impairment: Orientation, Attention, Memory, Following commands, Safety/judgement, Awareness, Problem solving                 Orientation Level: Disoriented to, Time, Situation, Place Current Attention Level: Sustained (moments of sustained attention  today)   Following Commands: Follows one step commands inconsistently, Follows one step commands with increased time Safety/Judgement: Decreased awareness of safety, Decreased awareness of deficits Awareness: Intellectual Problem Solving: Decreased initiation, Slow processing, Difficulty sequencing, Requires verbal cues, Requires tactile cues General Comments: backward chaining works well        Exercises       General Comments        Pertinent Vitals/Pain Pain Assessment Pain Assessment: CPOT Facial Expression: Relaxed, neutral Body Movements: Absence of movements Muscle Tension: Relaxed Compliance with ventilator (intubated pts.): N/A Vocalization (extubated pts.): Talking in normal tone or no sound CPOT Total: 0 Pain Intervention(s): Monitored during session, Repositioned    Home Living                          Prior Function            PT Goals (current goals can now be found in the care plan section) Acute Rehab PT Goals Patient Stated Goal: did not state PT Goal Formulation: Patient unable to participate in goal setting Time For Goal Achievement: 04/28/23 Potential to Achieve Goals: Good Progress towards PT goals: Progressing toward goals    Frequency    Min 1X/week      PT Plan      Co-evaluation              AM-PAC PT "6 Clicks" Mobility   Outcome Measure  Help needed turning from your back to your side while in a flat bed without using bedrails?: Total Help needed moving from lying on your back to sitting on the side of a flat bed without using bedrails?: Total Help needed moving to and from a bed to a chair (including a wheelchair)?: Total Help needed standing up from a chair using your arms (e.g., wheelchair or bedside chair)?: Total Help needed to walk in hospital room?: Total Help needed climbing 3-5 steps with a railing? : Total 6 Click Score: 6    End of Session Equipment Utilized During Treatment: Oxygen Activity Tolerance: Patient tolerated treatment well Patient left: in bed;with call bell/phone within reach;with bed alarm set Nurse Communication: Mobility status (to clamp EVD for session) PT Visit Diagnosis: Muscle weakness (generalized) (M62.81);Difficulty in walking, not elsewhere classified (R26.2);Other symptoms and signs involving the nervous system (R29.898)     Time: 2841-3244 PT Time Calculation (min) (ACUTE  ONLY): 22 min  Charges:    $Therapeutic Activity: 8-22 mins PT General Charges $$ ACUTE PT VISIT: 1 Visit                     Wynn Maudlin, DPT Acute Rehabilitation Services Office [phone]  04/17/23 4:18 PM

## 2023-04-17 NOTE — Progress Notes (Signed)
Pt's temp 100.2.  PRN Tylenol given.  [facility] notified.

## 2023-04-17 NOTE — Progress Notes (Signed)
  NEUROSURGERY PROGRESS NOTE   Pt seen and examined. No issues overnight. Pt reports minimal HA.  EXAM: Temp:  [97.8 F (36.6 C)-100.2 F (37.9 C)] 99.2 F (37.3 C) (09/04 0800) Pulse Rate:  [66-80] 78 (09/04 0800) Resp:  [19-31] 31 (09/04 0800) BP: (114-170)/(60-95) 170/92 (09/04 0800) SpO2:  [97 %-100 %] 100 % (09/04 0800) Weight:  [86.2 kg] 86.2 kg (09/04 0500) Intake/Output      09/03 0701 09/04 0700 09/04 0701 09/05 0700   I.V. (mL/kg) 369.7 (4.3)    Other 60    NG/GT 1020    IV Piggyback 810.9    Total Intake(mL/kg) 2260.6 (26.2)    Urine (mL/kg/hr) 1600 (0.8)    Drains 167    Stool 400    Total Output 2167    Net +93.6          Somnolent, arouses to voice easily CN grossly intact Speech dysarthric Follows commands all extremities, but generalized weakness EVD in place, patent @  LABS: Lab Results  Component Value Date   CREATININE 0.51 04/17/2023   BUN 10 04/17/2023   NA 135 04/17/2023   K 3.5 04/17/2023   CL 102 04/17/2023   CO2 24 04/17/2023   Lab Results  Component Value Date   WBC 8.9 04/16/2023   HGB 11.6 (L) 04/16/2023   HCT 34.0 (L) 04/16/2023   MCV 89.3 04/16/2023   PLT 202 04/16/2023    TCD: Date POD PCO2 HCT BP   MCA ACA PCA OPHT SIPH VERT Basilar  8/31 CK         Right  Left   *  *   *  *   *  31   14  18    *  18   -24  -16   *       9/2 RH         Right  Left   43  28   *  *   *  *   11  10   36  -20   -29  -11   -22          IMPRESSION: - 82 y.o. female SAH d# 6 s/p coil embolization Acom aneurysm and EVD for associated HCP, neurologically stable  PLAN: - Cont supportive care - Cont Nimotop - Permissive HTN up to SBP - PT/OT/SLP  - TCD today    Lisbeth Renshaw, MD [facility]

## 2023-04-17 NOTE — Progress Notes (Signed)
Transcranial Doppler  Date POD PCO2 HCT BP  MCA ACA PCA OPHT SIPH VERT Basilar  8/31 CK     Right  Left   *  *   *  *   *  31   14  18    *  18   -24  -16   *      9/2 RH     Right  Left   43  28   *  *   *  *   11  10   36  -20   -29  -11   -22      9/4 RH     Right  Left   *  *   *  *   22  *   12  15   32  21   -12  -9   -15            Right  Left                                             Right  Left                                            Right  Left                                            Right  Left                                        *unable to insonate   MCA = Middle Cerebral Artery      OPHT = Opthalmic Artery     BASILAR = Basilar Artery   ACA = Anterior Cerebral Artery     SIPH = Carotid Siphon PCA = Posterior Cerebral Artery   VERT = Verterbral Artery                   Normal MCA = 62+\-12 ACA = 50+\-12 PCA = 42+\-23     Jean Rosenthal, RDMS, RVT

## 2023-04-17 NOTE — Progress Notes (Signed)
Inpatient Rehab Admissions Coordinator:  ? ?Per therapy recommendations,  patient was screened for CIR candidacy by Laura Staley, MS, CCC-SLP. At this time, Pt. Appears to be a a potential candidate for CIR. I will place   order for rehab consult per protocol for full assessment. Please contact me any with questions. ? ?Laura Staley, MS, CCC-SLP ?Rehab Admissions Coordinator  ?[phone] (celll) ?[phone] (office) ? ?

## 2023-04-17 NOTE — Progress Notes (Signed)
NAME:  BERENIZ TOCCI, MRN:  [id number], DOB:  1941/07/04, LOS: 5 ADMISSION DATE:  04/12/2023, CONSULTATION DATE:  04/12/2023 REFERRING MD:  NSGY, CHIEF COMPLAINT:  AMS   History of Present Illness:  82 year old woman who presented to [facility] ED 8/30 after husband found her down minimally responsive.  Vomited multiple times en route to ER.  Intubated for airway protection in ER.  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  NSGY to place EVD and determine best method of securing aneurysm.  PCCM to consult for vent management.  Hunt Hess 3/4 mFS 2 (thin, +IVH)  Pertinent Medical History:  HTN Anemia  Significant Hospital Events: Including procedures, antibiotic start and stop dates in addition to other pertinent events   8/30 EVD placement in ER, admit Coil embolization Left Acomm aneurysm  8/31 Tolerating SBT, able to follow simple commands  9/1 intermittent episodes of agitation overnight, bilateral wrist restraints added, extubated 9/2 TCD remaining neg for vasospasm   Interim History / Subjective:  No significant events overnight Mental status waxing and waning Remains mildly tachypneic to high 20s, shallow breathing No significant secretions or adventitious breath sounds CXR unchanged from prior Husband at bedside.  Objective:  Blood pressure (!) 170/92, pulse 78, temperature 98.3 F (36.8 C), temperature source Oral, resp. rate (!) 31, height 5\' 2"  (1.575 m), weight 86.2 kg, SpO2 100%.        Intake/Output Summary (Last 24 hours) at 04/17/2023 0810 Last data filed at 04/17/2023 0700 Gross per 24 hour  Intake 2120.6 ml  Output 2157 ml  Net -36.4 ml   Filed Weights   04/12/23 1000 04/16/23 0353 04/17/23 0500  Weight: 90.2 kg 87.7 kg 86.2 kg   Physical Examination: General: Acutely ill-appearing elderly woman in NAD. Resting in bed. HEENT: [place]/AT, anicteric sclera, PERRL, moist mucous membranes. Neuro: Drowsy. Responds to noxious stimuli. Following  commands intermittently. Moves all 4 extremities spontaneously. +Cough and +Gag  CV: RRR, no m/g/r. PULM: Breathing even, shallow but unlabored on 3LNC. Lung fields CTAB in upper fields, diminished at bases bilaterally. GI: Soft, nontender, nondistended. Normoactive bowel sounds. Extremities: No LE edema noted. Skin: Warm/dry, no rashes.  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 CT Head 9/2 shows slight improved ventriculomegaly, cytotoxic edema of anterior left basal ganglia consistent with ischemic infarct. - Management per NSGY - EVD in place - Frequent neurochecks - AEDs per NSGY (Keppra) - Continue nimotop - TCDs per protocol, monitor for vasospasm - Seizure/aspiration precautions - Neuroprotective measures: HOB > 30 degrees, normoglycemia, normothermia, electrolytes WNL - PT/OT/SLP as appropriate  Acute hypoxemic respiratory failure due to Aspiration pneumonia  Extubated 9/1, sats 100% on 3LNC. CXR stable 9/4. - Continue supplemental O2 support - Wean O2 for sat > 90% - Pulmonary hygiene - S/p ceftriaxone x 5 day course (end 9/3) - Intermittent CXR   Hypophosphatemia Hypokalemia - Trend BMP - Replete electrolytes as indicated - Monitor I&Os  Dysphagia Evaluated by SLP.  - Continue TF/supplements - SLP consult/evaluation as indicated, may require MBSS/FEES once mental status better  Best Practice: (right click and "Reselect all SmartList Selections" daily)   Diet/type: NPO, tube feeds DVT prophylaxis: SCD, [place] heparin started 9/1 GI prophylaxis: PPI Lines: N/A Foley:  Yes, and it is still needed Code Status:  full code Last date of multidisciplinary  goals of care discussion [Per Primary]  Critical care time: N/A   Faythe Ghee [facility] 04/17/23 8:11 AM  Please see [email or web address] for pager details.  From 7A-7P if no response, please call [phone] After hours, please call ELink [phone]

## 2023-04-17 NOTE — Progress Notes (Signed)
Verbal order from Dr. Conchita Paris for permissive hypertension, and only treat if >200 SBP.  EVD level of also confirmed at bedside.

## 2023-04-18 ENCOUNTER — Inpatient Hospital Stay (HOSPITAL_COMMUNITY): Payer: Medicare HMO

## 2023-04-18 DIAGNOSIS — R4182 Altered mental status, unspecified: Secondary | ICD-10-CM | POA: Diagnosis not present

## 2023-04-18 DIAGNOSIS — E876 Hypokalemia: Secondary | ICD-10-CM

## 2023-04-18 DIAGNOSIS — I619 Nontraumatic intracerebral hemorrhage, unspecified: Secondary | ICD-10-CM

## 2023-04-18 DIAGNOSIS — R1310 Dysphagia, unspecified: Secondary | ICD-10-CM

## 2023-04-18 DIAGNOSIS — E871 Hypo-osmolality and hyponatremia: Secondary | ICD-10-CM

## 2023-04-18 LAB — BASIC METABOLIC PANEL
Anion gap: 10 (ref 5–15)
Anion gap: 10 (ref 5–15)
Anion gap: 9 (ref 5–15)
BUN: 13 mg/dL (ref 8–23)
BUN: 18 mg/dL (ref 8–23)
BUN: 17 mg/dL (ref 8–23)
CO2: 22 mmol/L (ref 22–32)
CO2: 23 mmol/L (ref 22–32)
CO2: 23 mmol/L (ref 22–32)
Calcium: 9.2 mg/dL (ref 8.9–10.3)
Calcium: 9 mg/dL (ref 8.9–10.3)
Calcium: 9 mg/dL (ref 8.9–10.3)
Chloride: 97 mmol/L — ABNORMAL LOW (ref 98–111)
Chloride: 96 mmol/L — ABNORMAL LOW (ref 98–111)
Chloride: 99 mmol/L (ref 98–111)
Creatinine, Ser: 0.64 mg/dL (ref 0.44–1.00)
Creatinine, Ser: 0.55 mg/dL (ref 0.44–1.00)
Creatinine, Ser: 0.58 mg/dL (ref 0.44–1.00)
GFR, Estimated: >60 mL/min (ref >60)
GFR, Estimated: >60 mL/min (ref >60)
GFR, Estimated: >60 mL/min (ref >60)
Glucose, Bld: 121 mg/dL — ABNORMAL HIGH (ref 70–99)
Glucose, Bld: 112 mg/dL — ABNORMAL HIGH (ref 70–99)
Glucose, Bld: 136 mg/dL — ABNORMAL HIGH (ref 70–99)
Potassium: 3.9 mmol/L (ref 3.5–5.1)
Potassium: 4.2 mmol/L (ref 3.5–5.1)
Potassium: 3.9 mmol/L (ref 3.5–5.1)
Sodium: 129 mmol/L — ABNORMAL LOW (ref 135–145)
Sodium: 129 mmol/L — ABNORMAL LOW (ref 135–145)
Sodium: 131 mmol/L — ABNORMAL LOW (ref 135–145)

## 2023-04-18 LAB — GLUCOSE, CAPILLARY
Glucose-Capillary: 109 mg/dL — ABNORMAL HIGH (ref 70–99)
Glucose-Capillary: 111 mg/dL — ABNORMAL HIGH (ref 70–99)
Glucose-Capillary: 121 mg/dL — ABNORMAL HIGH (ref 70–99)
Glucose-Capillary: 131 mg/dL — ABNORMAL HIGH (ref 70–99)
Glucose-Capillary: 150 mg/dL — ABNORMAL HIGH (ref 70–99)
Glucose-Capillary: 153 mg/dL — ABNORMAL HIGH (ref 70–99)

## 2023-04-18 LAB — CBC
HCT: 36.6 % (ref 36.0–46.0)
Hemoglobin: 11.9 g/dL — ABNORMAL LOW (ref 12.0–15.0)
MCH: 29.5 pg (ref 26.0–34.0)
MCHC: 32.5 g/dL (ref 30.0–36.0)
MCV: 90.8 fL (ref 80.0–100.0)
Platelets: 230 10*3/uL (ref 150–400)
RBC: 4.03 MIL/uL (ref 3.87–5.11)
RDW: 13.7 % (ref 11.5–15.5)
WBC: 10.3 10*3/uL (ref 4.0–10.5)
nRBC: 0 % (ref 0.0–0.2)

## 2023-04-18 LAB — MAGNESIUM: Magnesium: 2.1 mg/dL (ref 1.7–2.4)

## 2023-04-18 LAB — OSMOLALITY: Osmolality: 282 mosm/kg (ref 275–295)

## 2023-04-18 LAB — PROCALCITONIN: Procalcitonin: <0.1 ng/mL

## 2023-04-18 LAB — OSMOLALITY, URINE: Osmolality, Ur: 765 mosm/kg (ref 300–900)

## 2023-04-18 LAB — SODIUM: Sodium: 133 mmol/L — ABNORMAL LOW (ref 135–145)

## 2023-04-18 LAB — PHOSPHORUS: Phosphorus: 3.1 mg/dL (ref 2.5–4.6)

## 2023-04-18 MED ORDER — SODIUM CHLORIDE 3 % IV SOLN
INTRAVENOUS | Status: DC
  Filled 2023-04-18 (×2): qty 500

## 2023-04-18 NOTE — Progress Notes (Signed)
NAME:  Wendy Fleming, MRN:  [id number], DOB:  01/20/1941, LOS: 6 ADMISSION DATE:  04/12/2023, CONSULTATION DATE:  04/12/2023 REFERRING MD:  NSGY, CHIEF COMPLAINT:  AMS   History of Present Illness:  82 year old woman who presented to [facility] ED 8/30 after husband found her down minimally responsive.  Vomited multiple times en route to ER.  Intubated for airway protection in ER.  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  NSGY to place EVD and determine best method of securing aneurysm.  PCCM to consult for vent management.  [place] 3/4 mFS 2 (thin, +IVH)  Pertinent Medical History:  HTN Anemia  Significant Hospital Events: Including procedures, antibiotic start and stop dates in addition to other pertinent events   8/30 EVD placement in ER, admit Coil embolization Left Acomm aneurysm  8/31 Tolerating SBT, able to follow simple commands  9/1 intermittent episodes of agitation overnight, bilateral wrist restraints added, extubated 9/2 TCD remaining neg for vasospasm 9/4 Waxing/waning mental status, at times somnolent. EVD functioning well. 9/5 More somnolent, NSGY notified. EVD functioning. Na 129 (135). Foley placed for strict I&Os while correcting sodium. Febrile to 101.78F.  Interim History / Subjective:  No significant events overnight Febrile this AM to 101.78F, suspect neuro-driven, no outright infectious symptoms Slight bump in WBC to 10.3 (8.9) CXR stable PCT sent Tachypneic to 30s, shallow breaths but does not appear uncomfortable, suspect neuro-mediated Per NSGY, requesting LTM EEG No indication for repeat imaging at present Husband at bedside  Objective:  Blood pressure (!) 150/76, pulse 72, temperature 99.9 F (37.7 C), temperature source Axillary, resp. rate 17, height 5\' 2"  (1.575 m), weight 85.1 kg, SpO2 100%.        Intake/Output Summary (Last 24 hours) at 04/18/2023 0733 Last data filed at 04/18/2023 0700 Gross per 24 hour  Intake 1434.2 ml   Output 3258 ml  Net -1823.8 ml   Filed Weights   04/16/23 0353 04/17/23 0500 04/18/23 0419  Weight: 87.7 kg 86.2 kg 85.1 kg   Physical Examination: General: Acutely ill-appearing elderly woman in NAD. HEENT: Normocephalic, s/p EVD placement with serosanguinous output, anicteric sclera, pupils slightly unequal (L 3mm, R 2mm), round and reactive, moist mucous membranes. Neuro: Lethargic. Responds to noxious stimuli. Following commands intermittently. Moves all 4 extremities spontaneously. Grimaces to pain in all 4 extremities/trap squeeze. +Corneal, +Cough, and +Gag  CV: RRR, no m/g/r. PULM: Breathing tachypneic and shallow but unlabored on 2LNC. Lung fields clear, diminished at bilateral bases. GI: Soft, nontender, nondistended. Normoactive bowel sounds. Extremities: Trace bilateral symmetric LE edema noted. Skin: Warm/dry, no rashes.  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 CT Head 9/2 shows slight improved ventriculomegaly, cytotoxic edema of anterior left basal ganglia consistent with ischemic infarct. - Management per NSGY - EVD in place - Frequent neurochecks - AEDs per NSGY (Keppra for seizure ppx) - LTM EEG added 9/5 for waxing/waning responsiveness - Continue nimotop, TCDs per protocol - Seizure/aspiration precautions - Neuroprotective measures: HOB > 30 degrees, normoglycemia, normothermia, electrolytes WNL - PT/OT/SLP as appropriate  Acute hypoxemic respiratory failure due to Aspiration pneumonia  Extubated 9/1, sats 100% on 3LNC. CXR stable 9/4. - Continue supplemental O2 support - Wean O2 for sat > 90% - Pulmonary hygiene - S/p ceftriaxone x 5-day course (end 9/3) - Intermittent CXR   Hyponatremia Hypophosphatemia,  improved Hypokalemia, improved - Trend  BMP (Na BID) - Replete electrolytes as indicated - I&Os, Foley placement for strict monitoring - F/u urine studies (Osm, serum Osm)  Dysphagia Evaluated by SLP.  - Continue TF/supplements - SLP following, may require MBSS/FEES once able to participate in care/mental status improves  Best Practice: (right click and "Reselect all SmartList Selections" daily)   Diet/type: NPO, tube feeds DVT prophylaxis: SCD, [place] heparin started 9/1 GI prophylaxis: PPI Lines: N/A Foley:  Yes, and it is still needed Code Status:  full code Last date of multidisciplinary goals of care discussion [Per Primary]  Critical care time: N/A   Faythe Ghee [place] [facility] 04/18/23 7:33 AM  Please see [email or web address] for pager details.  From 7A-7P if no response, please call [phone] After hours, please call ELink [phone]

## 2023-04-18 NOTE — Progress Notes (Signed)
LTM started, CZ omitted due to surgical dressing, all Impedances under 10k, [facility] notified

## 2023-04-18 NOTE — Progress Notes (Signed)
  NEUROSURGERY PROGRESS NOTE   Pt seen and examined. No issues overnight. Pt more somnolent this am.  EXAM: Temp:  [98.3 F (36.8 C)-101.2 F (38.4 C)] 101.2 F (38.4 C) (09/05 0800) Pulse Rate:  [69-91] 76 (09/05 1100) Resp:  [16-32] 27 (09/05 1100) BP: (107-154)/(66-95) 120/72 (09/05 1100) SpO2:  [94 %-100 %] 95 % (09/05 1100) Weight:  [85.1 kg] 85.1 kg (09/05 0419) Intake/Output      09/04 0701 09/05 0700 09/05 0701 09/06 0700   I.V. (mL/kg) 189 (2.2) 38 (0.4)   Other 30    NG/GT 960 160   IV Piggyback 255.2    Total Intake(mL/kg) 1434.2 (16.9) 198 (2.3)   Urine (mL/kg/hr) 2550 (1.2) 100 (0.2)   Drains 163 23   Stool 545    Total Output 3258 123   Net -1823.8 +75         Somnolent, arouses to voice  Says name CN grossly intact Intermittently commands with heavy stimulation in all extremities with generalized weakness EVD in place, patent @  LABS: Lab Results  Component Value Date   CREATININE 0.64 04/18/2023   BUN 13 04/18/2023   NA 129 (L) 04/18/2023   K 3.9 04/18/2023   CL 97 (L) 04/18/2023   CO2 22 04/18/2023   Lab Results  Component Value Date   WBC 10.3 04/18/2023   HGB 11.9 (L) 04/18/2023   HCT 36.6 04/18/2023   MCV 90.8 04/18/2023   PLT 230 04/18/2023    TCD: Date POD PCO2 HCT BP   MCA ACA PCA OPHT SIPH VERT Basilar  8/31 CK         Right  Left   *  *   *  *   *  31   14  18    *  18   -24  -16   *       9/2 RH         Right  Left   43  28   *  *   *  *   11  10   36  -20   -29  -11   -22       9/4 RH         Right  Left   *  *   *  *   22  *   12  15   32  21   -12  -9   -15      IMPRESSION: - 82 y.o. 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 Aside from decreased LOC, exam is non-focal.   Fever and hyponatermia, may contribute to drowsiness. Not tachy, no leukocytosis to suggest  infection.  PLAN: - LT-EEG in place - Cont supportive care, may need low-dose hypertonic saline for hyponatremia - Antipyretics for fever - Cont Nimotop - Permissive HTN up to SBP - PT/OT/SLP  - Keep EVD open at    Lisbeth Renshaw, MD [facility]

## 2023-04-18 NOTE — Progress Notes (Signed)
LTM maint complete - no skin breakdown seen serviced C3 Cz O1  [facility] monitored, Event button test confirmed by [facility].

## 2023-04-18 NOTE — Care Plan (Signed)
EEG reviewed till 1115.  No seizures.  Wendy Fleming Annabelle Harman

## 2023-04-18 NOTE — Progress Notes (Signed)
IP rehab admissions - No family present in room on my rounds.  I called spouse but no answer.  I called and spoke with daughter,Jayne.  I explained inpatient rehab process and talked about rehab options.  Currently patient is not participative enough to open the case with insurance and is not medically ready.  Will follow along for now.  Call for questions.  [phone]

## 2023-04-18 NOTE — TOC Progression Note (Signed)
Transition of Care [facility]) - Progression Note    Patient Details  Name: Wendy Fleming MRN: [id number] Date of Birth: [birth year]  Transition of [facility]) CM/SW Contact  Mearl Latin, LCSW Phone Number: 04/18/2023, 3:24 PM  Clinical Narrative:    CSW following for CIR versus SNF needs.         Expected Discharge Plan and Services                                               Social Determinants of Health (SDOH) Interventions SDOH Screenings   Tobacco Use: Medium Risk (04/12/2023)    Readmission Risk Interventions     No data to display

## 2023-04-18 NOTE — Consult Note (Signed)
Physical Medicine and Rehabilitation Consult Reason for Consult: Rehab Referring Physician: Dr. Pecola Leisure   HPI: Wendy Fleming is a 82 y.o. female without significant past medical history who was found by her husband when she was down and minimally responsive.  She vomited several times during transport by EMS and was emergently intubated upon arrival.  CT head indicated large acute hemorrhage within the lateral, third and fourth ventricles with associated hydrocephalus, multifocal acute subarachnoid hemorrhage, moderate chronic small vessel ischemic changes within the cerebral white matter.  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  She was treated with Nimotop.  Patient had intermittent episodes of agitation at night and waxing/waning mental status.  She was extubated on 9/1.  She is being treated with ceftriaxone for aspiration pneumonia.  Patient became more somnolent 9/5 and found to be hyponatremic and she was also febrile.  LTM EEG was started.  She has dysphagia and is on tube feeds, followed by SLP.  She was seen by physical therapy and found to have significant functional limitations.  Review of Systems  Reason unable to perform ROS: Altered Mental Status.   Past Medical History:  Diagnosis Date   Anemia    many years ago   Arthritis    Headache    rare   Hypertension    Past Surgical History:  Procedure Laterality Date   ankle surgery for fracture Right 01-19-2014   COLONOSCOPY WITH PROPOFOL N/A 06/07/2014   Procedure: COLONOSCOPY WITH PROPOFOL;  Surgeon: Charolett Bumpers, MD;  Location: [facility];  Service: Endoscopy;  Laterality: N/A;   GALLBLADDER SURGERY     IR 3D INDEPENDENT WKST  04/12/2023   IR ANGIO VERTEBRAL SEL VERTEBRAL UNI L MOD SED  04/12/2023   IR ANGIOGRAM FOLLOW UP STUDY   04/12/2023   IR ANGIOGRAM FOLLOW UP STUDY  04/12/2023   IR ANGIOGRAM FOLLOW UP STUDY  04/12/2023   IR ANGIOGRAM FOLLOW UP STUDY  04/12/2023   IR ANGIOGRAM FOLLOW UP STUDY  04/12/2023   IR ANGIOGRAM FOLLOW UP STUDY  04/12/2023   IR ANGIOGRAM FOLLOW UP STUDY  04/12/2023   IR TRANSCATH/EMBOLIZ  04/12/2023   RADIOLOGY WITH ANESTHESIA N/A 04/12/2023   Procedure: IR WITH ANESTHESIA;  Surgeon: Lisbeth Renshaw, MD;  Location: [facility] OR;  Service: Radiology;  Laterality: N/A;   TUBAL LIGATION     Family History  Problem Relation Age of Onset   Breast cancer Maternal Aunt    Breast cancer Cousin    Breast cancer Sister    Social History:  reports that she quit smoking about 13 years ago. Her smoking use included cigarettes. She started smoking about 23 years ago. She has a 2.5 pack-year smoking history. She has never used smokeless tobacco. She reports current alcohol use. She reports that she does not use drugs. Allergies:  Allergies  Allergen Reactions   Erythromycin Swelling    Other Reaction(s): rash/swelling   Penicillins Hives    Other Reaction(s): rash/swelling   Streptomycin Swelling    Other Reaction(s): rash/swelling   Sulfa Antibiotics Nausea And Vomiting   Tetracycline Hcl     Other Reaction(s): rash/swelling   Tetracyclines & Related Swelling   Medications Prior to Admission  Medication Sig Dispense Refill   acetaminophen (TYLENOL) 325 MG tablet Take 650 mg by mouth every  6 (six) hours as needed (pain).     cholecalciferol (VITAMIN D) 1000 UNITS tablet Take 1,000 Units by mouth every morning.      Glucosamine HCl (GLUCOSAMINE PO) Take by mouth.     Multiple Vitamin (MULTIVITAMIN WITH MINERALS) TABS Take 1 tablet by mouth every morning.      Naproxen Sodium (ALEVE) 220 MG CAPS Take 1 capsule by mouth once as needed (pain.).     trandolapril (MAVIK) 4 MG tablet Take 4 mg by mouth every morning.      PRESCRIPTION MEDICATION Apply 1 application topically 2 (two) times a week. Compounded  cream for ankle pain. (Patient not taking: Reported on 04/12/2023)      Home: Home Living Family/patient expects to be discharged to:: Private residence Living Arrangements: Spouse/significant other Available Help at Discharge: Family Additional Comments: OT attempted to call husband, but no answer, pt unable to provide info at this time  Lives With: Spouse  Functional History: Prior Function Prior Level of Function : Independent/Modified Independent Functional Status:  Mobility: Bed Mobility Overal bed mobility: Needs Assistance Bed Mobility: Supine to Sit, Sit to Supine Supine to sit: Total assist, +2 for physical assistance, +2 for safety/equipment Sit to supine: Total assist, +2 for physical assistance, +2 for safety/equipment General bed mobility comments: Diffiuclty initiating movement. pt obtunded at start of session alerting to noxious stimuli of sternal rub and at nail bed. Pt required Total Assist with bed pad to pivot hips/trunk and sit up EOB. More alert EOB and eyes open. pt unable to atneriorly weight shift to maintain balance sitting. pt not following cues/commands for UE/LE movements today. Transfers Overall transfer level: Needs assistance Equipment used: 2 person hand held assist Transfers: Sit to/from Stand Sit to Stand: +2 physical assistance, Max assist Transfer via Lift Equipment: Stedy General transfer comment: pt unable due to lethargy/fatigue today Ambulation/Gait General Gait Details: deferred    ADL: ADL Overall ADL's : Needs assistance/impaired Grooming: Oral care, Moderate assistance Grooming Details (indicate cue type and reason): able to use toothette to clean mouth; wiped mouth with wet cloth after brushing teeth Functional mobility during ADLs: Maximal assistance, +2 for physical assistance General ADL Comments: Total assist at bedlevel  Cognition: Cognition Overall Cognitive Status: Impaired/Different from baseline Arousal/Alertness:  Lethargic Orientation Level: Oriented to person, Disoriented to place, Disoriented to time, Disoriented to situation Attention: Sustained Sustained Attention: Impaired Sustained Attention Impairment: Verbal basic, Functional basic Problem Solving: Impaired Problem Solving Impairment: Functional basic Cognition Arousal: Lethargic, Obtunded Behavior During Therapy: Flat affect Overall Cognitive Status: Impaired/Different from baseline Area of Impairment: Orientation, Attention, Memory, Following commands, Safety/judgement, Awareness, Problem solving Orientation Level: Disoriented to, Time, Situation, Place Current Attention Level: Sustained (moments of sustained attention today) Following Commands: Follows one step commands inconsistently, Follows one step commands with increased time Safety/Judgement: Decreased awareness of safety, Decreased awareness of deficits Awareness: Intellectual Problem Solving: Decreased initiation, Slow processing, Difficulty sequencing, Requires verbal cues, Requires tactile cues General Comments: backward chaining works well  Blood pressure 134/79, pulse 84, temperature (!) 101.2 F (38.4 C), temperature source Axillary, resp. rate (!) 27, height 5\' 2"  (1.575 m), weight 85.1 kg, SpO2 100%. Physical Exam   General: Ill-appearing, NAD HEENT: Normocephalic, EVD in place, long-term EEG monitoring in place, MMM Neck: Supple without JVD or lymphadenopathy Heart: Reg rate and rhythm. No murmurs rubs or gallops Chest: Tachypneic, on nasal cannula, CTAB Abdomen: Soft, non-tender, non-distended, bowel sounds positive. Extremities: Trace bilateral lower extremity edema Psych: Lethargic Skin: Clean and intact  without signs of breakdown Neuro: Lethargic, withdraws lower extremities to noxious stimuli, intermittently opening eyes.  Spontaneous movement of left lower extremity noted, no verbal output noted, not answering questions or following commands Musculoskeletal:  No joint swelling noted   Results for orders placed or performed during the hospital encounter of 04/12/23 (from the past 24 hour(s))  Glucose, capillary     Status: Abnormal   Collection Time: 04/17/23  3:05 PM  Result Value Ref Range   Glucose-Capillary 134 (H) 70 - 99 mg/dL  Glucose, capillary     Status: Abnormal   Collection Time: 04/17/23  7:55 PM  Result Value Ref Range   Glucose-Capillary 123 (H) 70 - 99 mg/dL  Glucose, capillary     Status: Abnormal   Collection Time: 04/17/23 11:47 PM  Result Value Ref Range   Glucose-Capillary 146 (H) 70 - 99 mg/dL  Glucose, capillary     Status: Abnormal   Collection Time: 04/18/23  3:40 AM  Result Value Ref Range   Glucose-Capillary 111 (H) 70 - 99 mg/dL  CBC     Status: Abnormal   Collection Time: 04/18/23  5:06 AM  Result Value Ref Range   WBC 10.3 4.0 - 10.5 K/uL   RBC 4.03 3.87 - 5.11 MIL/uL   Hemoglobin 11.9 (L) 12.0 - 15.0 g/dL   HCT 86.5 78.4 - 69.6 %   MCV 90.8 80.0 - 100.0 fL   MCH 29.5 26.0 - 34.0 pg   MCHC 32.5 30.0 - 36.0 g/dL   RDW 29.5 28.4 - 13.2 %   Platelets 230 150 - 400 K/uL   nRBC 0.0 0.0 - 0.2 %  Basic metabolic panel     Status: Abnormal   Collection Time: 04/18/23  5:06 AM  Result Value Ref Range   Sodium 129 (L) 135 - 145 mmol/L   Potassium 3.9 3.5 - 5.1 mmol/L   Chloride 97 (L) 98 - 111 mmol/L   CO2 22 22 - 32 mmol/L   Glucose, Bld 121 (H) 70 - 99 mg/dL   BUN 13 8 - 23 mg/dL   Creatinine, Ser 4.40 0.44 - 1.00 mg/dL   Calcium 9.2 8.9 - 10.2 mg/dL   GFR, Estimated >72 >53 mL/min   Anion gap 10 5 - 15  Phosphorus     Status: None   Collection Time: 04/18/23  5:06 AM  Result Value Ref Range   Phosphorus 3.1 2.5 - 4.6 mg/dL  Magnesium     Status: None   Collection Time: 04/18/23  5:06 AM  Result Value Ref Range   Magnesium 2.1 1.7 - 2.4 mg/dL  Glucose, capillary     Status: Abnormal   Collection Time: 04/18/23  7:54 AM  Result Value Ref Range   Glucose-Capillary 131 (H) 70 - 99 mg/dL   Osmolality     Status: None   Collection Time: 04/18/23  9:22 AM  Result Value Ref Range   Osmolality 282 275 - 295 mOsm/kg  Osmolality, urine     Status: None   Collection Time: 04/18/23  9:22 AM  Result Value Ref Range   Osmolality, Ur 765 300 - 900 mOsm/kg  Procalcitonin     Status: None   Collection Time: 04/18/23  9:22 AM  Result Value Ref Range   Procalcitonin <0.10 ng/mL  Glucose, capillary     Status: Abnormal   Collection Time: 04/18/23 12:23 PM  Result Value Ref Range   Glucose-Capillary 153 (H) 70 - 99 mg/dL   VAS [place]  TRANSCRANIAL DOPPLER  Result Date: 04/17/2023  Transcranial Doppler Patient Name:  SHAWTA SPEARIN  Date of Exam:   04/17/2023 Medical Rec #: [id number]         Accession #:    [id number] Date of Birth: 07/29/1941         Patient Gender: F Patient Age:   64 years Exam Location:  [facility] Procedure:      VAS [place] TRANSCRANIAL DOPPLER Referring Phys: Huntley Dec TOMLINSON --------------------------------------------------------------------------------  Indications: Subarachnoid hemorrhage. Limitations: Difficult to insonate bilateral temporal windows. Patient              somnolence. Performing Technologist: Jean Rosenthal RDMS, RVT  Examination Guidelines: A complete evaluation includes B-mode imaging, spectral Doppler, color Doppler, and power Doppler as needed of all accessible portions of each vessel. Bilateral testing is considered an integral part of a complete examination. Limited examinations for reoccurring indications may be performed as noted.  +----------+---------------+----------+-----------+-------+ RIGHT TCD Right VM (cm/s)Depth (cm)PulsatilityComment +----------+---------------+----------+-----------+-------+ PCA P1          14                    1.15            +----------+---------------+----------+-----------+-------+ Opthalmic       12                    1.23            +----------+---------------+----------+-----------+-------+ ICA  siphon      32                    1.24            +----------+---------------+----------+-----------+-------+ Vertebral       -12                   0.95            +----------+---------------+----------+-----------+-------+  +----------+--------------+----------+-----------+-------+ LEFT TCD  Left VM (cm/s)Depth (cm)PulsatilityComment +----------+--------------+----------+-----------+-------+ Opthalmic       15                   1.16            +----------+--------------+----------+-----------+-------+ ICA siphon      21                   1.42            +----------+--------------+----------+-----------+-------+ Vertebral       -9                   0.68            +----------+--------------+----------+-----------+-------+  +------------+-------+-------+             VM cm/sComment +------------+-------+-------+ Prox Basilar  -11          +------------+-------+-------+ Dist Basilar  -15          +------------+-------+-------+    Preliminary    DG CHEST PORT 1 VIEW  Result Date: 04/17/2023 CLINICAL DATA:  Shortness of breath. EXAM: PORTABLE CHEST 1 VIEW COMPARISON:  April 16, 2023. FINDINGS: Stable cardiomegaly. Stable nasogastric tube. Right-sided PICC line is noted with tip in expected position of the SVC. Minimal bibasilar subsegmental atelectasis. IMPRESSION: Minimal bibasilar subsegmental atelectasis. Electronically Signed   By: Lupita Raider M.D.   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  S/p EVD and coil embolization by neurosurgery on 8/30 Does the need for close, 24 hr/day medical supervision in concert with the patient's rehab needs make it unreasonable for this patient to be served in a less intensive setting? Yes Co-Morbidities requiring supervision/potential complications:  -Acute-hypoxemic respiratory  failure due to aspiration pneumonia, hyponatremia, hypokalemia, dysphagia Due to bladder management, bowel management, safety, skin/wound care, disease management, medication administration, pain management, and patient education, does the patient require 24 hr/day rehab nursing? Yes Does the patient require coordinated care of a physician, rehab nurse, therapy disciplines of PT OT and SLP to address physical and functional deficits in the context of the above medical diagnosis(es)? Yes Addressing deficits in the following areas: balance, endurance, locomotion, strength, transferring, bowel/bladder control, bathing, dressing, feeding, grooming, toileting, cognition, speech, language, swallowing, and psychosocial support Can the patient actively participate in an intensive therapy program of at least 3 hrs of therapy per day at least 5 days per week? Potentially The potential for patient to make measurable gains while on inpatient rehab is good Anticipated functional outcomes upon discharge from inpatient rehab are supervision and min assist  with PT, supervision and min assist with OT, supervision and min assist with SLP. Estimated rehab length of stay to reach the above functional goals is: 18-21 Anticipated discharge destination:  Monitor progress further prior to recommendation Overall Rehab/Functional Prognosis: good  POST ACUTE RECOMMENDATIONS: This patient's condition is appropriate for continued rehabilitative care in the following setting:  To be determined Patient has agreed to participate in recommended program. N/A Note that insurance prior authorization may be required for reimbursement for recommended care.  Comment: Given patient's history and functional limitations I think she may be a good candidate for CIR at a later time pending medical stability.  Continue to monitor her progress with therapy, would like to ensure that she can tolerate CIR.   I have personally performed a face to  face diagnostic evaluation of this patient. Additionally, I have examined the patient's medical record including any pertinent labs and radiographic images. If the physician assistant has documented in this note, I have reviewed and edited or otherwise concur with the physician assistant's documentation.  Thanks,  Fanny Dance, MD 04/18/2023

## 2023-04-19 ENCOUNTER — Inpatient Hospital Stay (HOSPITAL_COMMUNITY): Payer: Medicare HMO

## 2023-04-19 DIAGNOSIS — R569 Unspecified convulsions: Secondary | ICD-10-CM

## 2023-04-19 DIAGNOSIS — I609 Nontraumatic subarachnoid hemorrhage, unspecified: Secondary | ICD-10-CM

## 2023-04-19 DIAGNOSIS — R4182 Altered mental status, unspecified: Secondary | ICD-10-CM | POA: Diagnosis not present

## 2023-04-19 LAB — GLUCOSE, CAPILLARY
Glucose-Capillary: 107 mg/dL — ABNORMAL HIGH (ref 70–99)
Glucose-Capillary: 108 mg/dL — ABNORMAL HIGH (ref 70–99)
Glucose-Capillary: 109 mg/dL — ABNORMAL HIGH (ref 70–99)
Glucose-Capillary: 112 mg/dL — ABNORMAL HIGH (ref 70–99)
Glucose-Capillary: 116 mg/dL — ABNORMAL HIGH (ref 70–99)
Glucose-Capillary: 130 mg/dL — ABNORMAL HIGH (ref 70–99)

## 2023-04-19 LAB — BASIC METABOLIC PANEL
Anion gap: 10 (ref 5–15)
Anion gap: 15 (ref 5–15)
Anion gap: 8 (ref 5–15)
Anion gap: 13 (ref 5–15)
Anion gap: 10 (ref 5–15)
Anion gap: 6 (ref 5–15)
BUN: 16 mg/dL (ref 8–23)
BUN: 15 mg/dL (ref 8–23)
BUN: 12 mg/dL (ref 8–23)
BUN: 19 mg/dL (ref 8–23)
BUN: 18 mg/dL (ref 8–23)
BUN: 17 mg/dL (ref 8–23)
CO2: 21 mmol/L — ABNORMAL LOW (ref 22–32)
CO2: 22 mmol/L (ref 22–32)
CO2: 22 mmol/L (ref 22–32)
CO2: 22 mmol/L (ref 22–32)
CO2: 19 mmol/L — ABNORMAL LOW (ref 22–32)
CO2: 21 mmol/L — ABNORMAL LOW (ref 22–32)
Calcium: 8.6 mg/dL — ABNORMAL LOW (ref 8.9–10.3)
Calcium: 9.4 mg/dL (ref 8.9–10.3)
Calcium: 8.4 mg/dL — ABNORMAL LOW (ref 8.9–10.3)
Calcium: 9 mg/dL (ref 8.9–10.3)
Calcium: 8 mg/dL — ABNORMAL LOW (ref 8.9–10.3)
Calcium: 8 mg/dL — ABNORMAL LOW (ref 8.9–10.3)
Chloride: 100 mmol/L (ref 98–111)
Chloride: 99 mmol/L (ref 98–111)
Chloride: 108 mmol/L (ref 98–111)
Chloride: 103 mmol/L (ref 98–111)
Chloride: 108 mmol/L (ref 98–111)
Chloride: 107 mmol/L (ref 98–111)
Creatinine, Ser: 0.59 mg/dL (ref 0.44–1.00)
Creatinine, Ser: 0.6 mg/dL (ref 0.44–1.00)
Creatinine, Ser: 0.55 mg/dL (ref 0.44–1.00)
Creatinine, Ser: 0.6 mg/dL (ref 0.44–1.00)
Creatinine, Ser: 0.6 mg/dL (ref 0.44–1.00)
Creatinine, Ser: 0.57 mg/dL (ref 0.44–1.00)
GFR, Estimated: >60 mL/min (ref >60)
GFR, Estimated: >60 mL/min (ref >60)
GFR, Estimated: >60 mL/min (ref >60)
GFR, Estimated: >60 mL/min (ref >60)
GFR, Estimated: >60 mL/min (ref >60)
GFR, Estimated: >60 mL/min (ref >60)
Glucose, Bld: 134 mg/dL — ABNORMAL HIGH (ref 70–99)
Glucose, Bld: 133 mg/dL — ABNORMAL HIGH (ref 70–99)
Glucose, Bld: 110 mg/dL — ABNORMAL HIGH (ref 70–99)
Glucose, Bld: 131 mg/dL — ABNORMAL HIGH (ref 70–99)
Glucose, Bld: 149 mg/dL — ABNORMAL HIGH (ref 70–99)
Glucose, Bld: 120 mg/dL — ABNORMAL HIGH (ref 70–99)
Potassium: 3.8 mmol/L (ref 3.5–5.1)
Potassium: 4.1 mmol/L (ref 3.5–5.1)
Potassium: 4.1 mmol/L (ref 3.5–5.1)
Potassium: 4 mmol/L (ref 3.5–5.1)
Potassium: 3.8 mmol/L (ref 3.5–5.1)
Potassium: 3.5 mmol/L (ref 3.5–5.1)
Sodium: 131 mmol/L — ABNORMAL LOW (ref 135–145)
Sodium: 136 mmol/L (ref 135–145)
Sodium: 138 mmol/L (ref 135–145)
Sodium: 138 mmol/L (ref 135–145)
Sodium: 137 mmol/L (ref 135–145)
Sodium: 134 mmol/L — ABNORMAL LOW (ref 135–145)

## 2023-04-19 LAB — CBC
HCT: 37.3 % (ref 36.0–46.0)
Hemoglobin: 12.1 g/dL (ref 12.0–15.0)
MCH: 29.2 pg (ref 26.0–34.0)
MCHC: 32.4 g/dL (ref 30.0–36.0)
MCV: 90.1 fL (ref 80.0–100.0)
Platelets: 242 10*3/uL (ref 150–400)
RBC: 4.14 MIL/uL (ref 3.87–5.11)
RDW: 13.9 % (ref 11.5–15.5)
WBC: 11.2 10*3/uL — ABNORMAL HIGH (ref 4.0–10.5)
nRBC: 0 % (ref 0.0–0.2)

## 2023-04-19 LAB — MAGNESIUM: Magnesium: 2 mg/dL (ref 1.7–2.4)

## 2023-04-19 LAB — PHOSPHORUS: Phosphorus: 3 mg/dL (ref 2.5–4.6)

## 2023-04-19 NOTE — Progress Notes (Signed)
  NEUROSURGERY PROGRESS NOTE   Pt seen and examined. No issues overnight.   EXAM: Temp:  [99.2 F (37.3 C)-100.4 F (38 C)] 100.1 F (37.8 C) (09/06 0800) Pulse Rate:  [73-100] 91 (09/06 0800) Resp:  [23-31] 31 (09/06 0800) BP: (107-161)/(66-98) 161/92 (09/06 0800) SpO2:  [94 %-100 %] 100 % (09/06 0800) Weight:  [85.9 kg] 85.9 kg (09/06 0500) Intake/Output      09/05 0701 09/06 0700 09/06 0701 09/07 0700   I.V. (mL/kg) 328 (3.8)    Other     NG/GT 810    IV Piggyback     Total Intake(mL/kg) 1138 (13.2)    Urine (mL/kg/hr) 1405 (0.7)    Drains 159    Stool 320    Total Output 1884    Net -746          Somnolent, arouses to voice  Occassionally verbalizes but unintelligibly CN grossly intact Not reliably following commands Localizes BUE, w/d BLE EVD in place, patent @ with bloody CSF  LABS: Lab Results  Component Value Date   CREATININE 0.60 04/19/2023   BUN 15 04/19/2023   NA 136 04/19/2023   K 4.1 04/19/2023   CL 99 04/19/2023   CO2 22 04/19/2023   Lab Results  Component Value Date   WBC 11.2 (H) 04/19/2023   HGB 12.1 04/19/2023   HCT 37.3 04/19/2023   MCV 90.1 04/19/2023   PLT 242 04/19/2023    TCD: Date POD PCO2 HCT BP   MCA ACA PCA OPHT SIPH VERT Basilar  8/31 CK         Right  Left   *  *   *  *   *  31   14  18    *  18   -24  -16   *       9/2 RH         Right  Left   43  28   *  *   *  *   11  10   36  -20   -29  -11   -22       9/4 RH         Right  Left   *  *   *  *   22  *   12  15   32  21   -12  -9   -15      IMPRESSION: - 82 y.o. female SAH d# 8 s/p coil embolization Acom aneurysm and EVD for associated HCP, has been increasingly somnolent over last few days.   Fever may contribute to drowsiness. Not tachy, no leukocytosis to suggest infection.  PLAN: - LT-EEG in place. If no SZ, will order non-contrast CTH. - Antipyretics for fever - Cont Nimotop - Permissive HTN up to  SBP - PT/OT/SLP  - Keep EVD open at - TCD today    Lisbeth Renshaw, MD [facility]

## 2023-04-19 NOTE — Progress Notes (Signed)
Physical Therapy Treatment Patient Details Name: Wendy Fleming MRN: [id number] DOB: Feb 18, 1941 Today's Date: 04/19/2023   History of Present Illness Pt is an 82 y.o. female who presented 04/12/23 after being found minimally responsive. 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 ETT 8/30 - 9/1. PMH: anemia, arthritis, HTN    PT Comments  Patient resting in bed eyes closed and not easily alerting to sound or tactile stimuli. Bed adjusted to chair position and pt required +2 assist to weight shift trunk anteriorly, eyes opened and pt alerted to position change. Min-max assist to maintain anterior lean and pt required max/total assist for oral care with assist from OT present. Total assist with bed pad to pivot hips and sit up EOB, pt drifting to sleep intermittently and required change in vestibular input with removal of posterior support to alert again. VSS with activity at EOB; Total Assist to return to supine required and pt made audible sigh of relief to be in resting position again once supine. Will continue to progress pt as able in acute setting.    If plan is discharge home, recommend the following: Two people to help with walking and/or transfers;Two people to help with bathing/dressing/bathroom;Assistance with cooking/housework;Direct supervision/assist for medications management;Direct supervision/assist for financial management;Assist for transportation;Help with stairs or ramp for entrance   Can travel by private vehicle        Equipment Recommendations  Other (comment) (defer to next venue of care)    Recommendations for Other Services Rehab consult     Precautions / Restrictions Precautions Precautions: Fall;Other (comment) Precaution Comments: EVD (clamp for sessions); flexiseal, foley, EEG Restrictions Weight Bearing Restrictions: No     Mobility  Bed Mobility Overal bed  mobility: Needs Assistance Bed Mobility: Supine to Sit, Sit to Supine     Supine to sit: Total assist, +2 for physical assistance, +2 for safety/equipment Sit to supine: Total assist, +2 for physical assistance, +2 for safety/equipment   General bed mobility comments: Diffiuclty initiating movement. pt obtunded at start of session alerting once moved to chair position and trunk anteriorly shifted by therapist +2. pt required max/total assist to participate in oral hygeine with OT with min-max assist to maintain forward trunk lean, more alert during oral care. Pt required Total Assist with bed pad to pivot hips/trunk and sit up EOB. More alert EOB and eyes open. pt unable to atneriorly weight shift to maintain balance sitting pt drifting to sleep sitting EOB but alerted to removal of posterior support but unable to correct LOB wihtout assist. pt not following cues/commands for UE/LE movements at EOB. Total Assist to return to supine.    Transfers                   General transfer comment: pt unable due to lethargy/fatigue today    Ambulation/Gait                   Stairs             Wheelchair Mobility     Tilt Bed    Modified Rankin (Stroke Patients Only)       Balance Overall balance assessment: Needs assistance   Sitting balance-Leahy Scale: Fair Sitting balance - Comments: able to sit unsupported with CGA   Standing balance support: Bilateral upper extremity supported Standing balance-Leahy Scale: Poor  Cognition Arousal: Lethargic, Obtunded Behavior During Therapy: Flat affect Overall Cognitive Status: Impaired/Different from baseline Area of Impairment: Orientation, Attention, Memory, Following commands, Safety/judgement, Awareness, Problem solving                 Orientation Level: Disoriented to, Time, Situation, Place Current Attention Level: Focused   Following Commands: Follows one step  commands inconsistently Safety/Judgement: Decreased awareness of safety, Decreased awareness of deficits Awareness: Intellectual Problem Solving: Decreased initiation, Slow processing, Difficulty sequencing, Requires verbal cues, Requires tactile cues General Comments: pt obtund at start of session,m eyes opened and alerted once sitting upright but drifitng back to seleep quickly. brief moments of pt able to track to therapist voice but unable to sustain. pt followed cue to squeeze Lt hand 2x but unable to initiate purposeful movement in Rt UE or bil LE. 1x attempt to reach Lt UE towards face possible to itch.        Exercises      General Comments        Pertinent Vitals/Pain Pain Assessment Pain Assessment: Faces Faces Pain Scale: No hurt Facial Expression: Relaxed, neutral Body Movements: Absence of movements Muscle Tension: Relaxed Compliance with ventilator (intubated pts.): N/A Vocalization (extubated pts.): Talking in normal tone or no sound CPOT Total: 0 Pain Intervention(s): Monitored during session, Repositioned    Home Living                          Prior Function            PT Goals (current goals can now be found in the care plan section) Acute Rehab PT Goals Patient Stated Goal: did not state PT Goal Formulation: Patient unable to participate in goal setting Time For Goal Achievement: 04/28/23 Potential to Achieve Goals: Good Progress towards PT goals: Progressing toward goals (slow)    Frequency    Min 1X/week      PT Plan      Co-evaluation              AM-PAC PT "6 Clicks" Mobility   Outcome Measure  Help needed turning from your back to your side while in a flat bed without using bedrails?: Total Help needed moving from lying on your back to sitting on the side of a flat bed without using bedrails?: Total Help needed moving to and from a bed to a chair (including a wheelchair)?: Total Help needed standing up from a chair  using your arms (e.g., wheelchair or bedside chair)?: Total Help needed to walk in hospital room?: Total Help needed climbing 3-5 steps with a railing? : Total 6 Click Score: 6    End of Session Equipment Utilized During Treatment: Oxygen Activity Tolerance: Patient tolerated treatment well Patient left: in bed;with call bell/phone within reach;with bed alarm set;with nursing/sitter in room Nurse Communication: Mobility status (to clamp EVD for session) PT Visit Diagnosis: Muscle weakness (generalized) (M62.81);Difficulty in walking, not elsewhere classified (R26.2);Other symptoms and signs involving the nervous system (R29.898)     Time: 0865-7846 PT Time Calculation (min) (ACUTE ONLY): 22 min  Charges:    $Therapeutic Activity: 8-22 mins PT General Charges $$ ACUTE PT VISIT: 1 Visit                     Wynn Maudlin, DPT Acute Rehabilitation Services Office [phone]  04/19/23 12:52 PM

## 2023-04-19 NOTE — Progress Notes (Signed)
SLP Cancellation Note  Patient Details Name: Wendy Fleming MRN: [id number] DOB: [birth year]   Cancelled treatment:       Reason Eval/Treat Not Completed: Fatigue/lethargy limiting ability to participate. Pt too lethargic for PO trials, not even waking up much even with replacement of Cortrak. Discussed with RN - will continue to follow as able.     Mahala Menghini., M.A. CCC-SLP Acute Rehabilitation Services Office [phone]  Secure chat preferred  04/19/2023, 10:23 AM

## 2023-04-19 NOTE — Progress Notes (Signed)
Inpatient Rehab Admissions Coordinator:  Attempted to contact pt's husband. Left a message. Awaiting return call. Will continue to follow.   Wolfgang Phoenix, MS, CCC-SLP Admissions Coordinator [phone]

## 2023-04-19 NOTE — Progress Notes (Signed)
Occupational Therapy Treatment Patient Details Name: Wendy Fleming MRN: [id number] DOB: [birth year] Today's Date: 04/19/2023   History of present illness Pt is an 82 y.o. female who presented 04/12/23 after being found minimally responsive. 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 ETT 8/30 - 9/1. PMH: anemia, arthritis, HTN   OT comments  Pt lethargic and requires increased stimulation to arouse.  Opens eyes once upright in chair position but not scanning or following commands.  After oral care, pt able to squeeze L hand.  Attempts to reach with L hand but not using R UE during session today. Limited verbalizations to sighing 2x during session.  Will follow acutely, may need to downgrade goals next session if not progressing.       If plan is discharge home, recommend the following:  Assist for transportation;Assistance with cooking/housework;Two people to help with walking and/or transfers;A lot of help with bathing/dressing/bathroom;Direct supervision/assist for medications management;Direct supervision/assist for financial management   Equipment Recommendations  None recommended by OT    Recommendations for Other Services Rehab consult    Precautions / Restrictions Precautions Precautions: Fall;Other (comment) Precaution Comments: EVD (clamp for sessions); flexiseal, foley, EEG Restrictions Weight Bearing Restrictions: No       Mobility Bed Mobility Overal bed mobility: Needs Assistance Bed Mobility: Supine to Sit, Sit to Supine     Supine to sit: Total assist, +2 for physical assistance, +2 for safety/equipment Sit to supine: Total assist, +2 for physical assistance, +2 for safety/equipment   General bed mobility comments: Chair position, with total assist to sit forward; once more alert, turned to EOB with total assist +2.    Transfers                   General  transfer comment: pt unable due to lethargy/fatigue today     Balance Overall balance assessment: Needs assistance   Sitting balance-Leahy Scale: Fair Sitting balance - Comments: able to sit unsupported with CGA                                   ADL either performed or assessed with clinical judgement   ADL Overall ADL's : Needs assistance/impaired     Grooming: Oral care;Wash/dry hands;Total assistance;Sitting Grooming Details (indicate cue type and reason): EOB with total assist for suction oral care and applying lotion to hands                             Functional mobility during ADLs: Total assistance;+2 for physical assistance;+2 for safety/equipment      Extremity/Trunk Assessment Upper Extremity Assessment Upper Extremity Assessment: RUE deficits/detail;LUE deficits/detail RUE Deficits / Details: pt not attempting to move UE during session RUE Sensation: decreased light touch;decreased proprioception RUE Coordination: decreased fine motor;decreased gross motor LUE Deficits / Details: patient able to squeeze hand a few times, reaching purposefully towards cortrak but otherwise not using UE during session LUE Sensation: decreased light touch LUE Coordination: decreased fine motor;decreased gross motor            Vision   Vision Assessment?: Vision impaired- to be further tested in functional context Additional Comments: pt with assist to open eyes manually initally, opening eyes more when upright after oral care. not scanning or turning head today   Perception     Praxis  Cognition Arousal: Lethargic, Obtunded Behavior During Therapy: Flat affect Overall Cognitive Status: Impaired/Different from baseline Area of Impairment: Orientation, Attention, Following commands, Safety/judgement, Awareness, Problem solving                 Orientation Level: Disoriented to, Time, Situation, Place Current Attention Level: Focused    Following Commands: Follows one step commands inconsistently, Follows one step commands with increased time Safety/Judgement: Decreased awareness of safety, Decreased awareness of deficits Awareness: Intellectual Problem Solving: Decreased initiation, Slow processing, Difficulty sequencing, Requires verbal cues, Requires tactile cues General Comments: increased alertness, minimally, with upright in chair position, with oral care, and once sitting EOB.  She is able to follow simple cues a few times but not consistently.        Exercises      Shoulder Instructions       General Comments VSS on RA; RN clamped EVD and assisted with line mgmt during session    Pertinent Vitals/ Pain       Pain Assessment Pain Assessment: Faces Faces Pain Scale: No hurt Pain Intervention(s): Monitored during session  Home Living                                          Prior Functioning/Environment              Frequency  Min 1X/week        Progress Toward Goals  OT Goals(current goals can now be found in the care plan section)  Progress towards OT goals: Not progressing toward goals - comment (lethargy)  Acute Rehab OT Goals OT Goal Formulation: Patient unable to participate in goal setting Time For Goal Achievement: 04/29/23 Potential to Achieve Goals: Fair  Plan      Co-evaluation    PT/OT/SLP Co-Evaluation/Treatment: Yes Reason for Co-Treatment: Complexity of the patient's impairments (multi-system involvement);Necessary to address cognition/behavior during functional activity;For patient/therapist safety;To address functional/ADL transfers   OT goals addressed during session: ADL's and self-care      AM-PAC OT "6 Clicks" Daily Activity     Outcome Measure   Help from another person eating meals?: Total Help from another person taking care of personal grooming?: Total Help from another person toileting, which includes using toliet, bedpan, or urinal?:  Total Help from another person bathing (including washing, rinsing, drying)?: Total Help from another person to put on and taking off regular upper body clothing?: Total Help from another person to put on and taking off regular lower body clothing?: Total 6 Click Score: 6    End of Session    OT Visit Diagnosis: Muscle weakness (generalized) (M62.81);Hemiplegia and hemiparesis;Other symptoms and signs involving cognitive function;Other symptoms and signs involving the nervous system (R29.898);Other abnormalities of gait and mobility (R26.89) Hemiplegia - Right/Left: Right Hemiplegia - dominant/non-dominant: Dominant Hemiplegia - caused by: Nontraumatic intracerebral hemorrhage   Activity Tolerance Patient limited by lethargy;Patient limited by fatigue   Patient Left in bed;with call bell/phone within reach;with bed alarm set;with nursing/sitter in room   Nurse Communication Mobility status;Need for lift equipment        Time: 1209-1236 OT Time Calculation (min): 27 min  Charges: OT General Charges $OT Visit: 1 Visit OT Treatments $Self Care/Home Management : 8-22 mins  Barry Brunner, OT Acute Rehabilitation Services Office [phone]   Chancy Milroy 04/19/2023, 2:17 PM

## 2023-04-19 NOTE — Procedures (Signed)
[name]  MRN: [id number]  Epilepsy Attending: [name]  Referring Physician/Provider: Lisbeth Renshaw, MD  Duration: 04/18/2023 1015 to 04/19/2023 1015  Patient history: 83 y.o. 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 EEG to evaluate for seizure.  Level of alertness:lethargic   AEDs during EEG study: LEV  Technical aspects: This EEG study was done with scalp electrodes positioned according to the 10-20 International system of electrode placement. Electrical activity was reviewed with band pass filter of 1-70Hz , sensitivity of 7 uV/mm, display speed of 76mm/sec with a 60Hz  notched filter applied as appropriate. EEG data were recorded continuously and digitally stored.  Video monitoring was available and reviewed as appropriate.  Description: EEG showed continuous generalized 3-5Hz  theta- delta slowing.  Generalized periodic discharges with triphasic morphology at 1 Hz were noted, predominantly when awake/stimulated.  Hyperventilation and photic stimulation were not performed.     ABNORMALITY -Periodic discharges with triphasic morphology , generalized -Continuous slow, generalized  IMPRESSION: This study showed generalized periodic discharges with triphasic morphology which can be on the ictal-interictal continuum.  However the morphology, frequency and reactivity to stimulation is more commonly suggestive of toxic-metabolic etiology. Additionally there is moderate diffuse encephalopathy.  No seizures were seen throughout the recording.  Meshia Rau Annabelle Harman

## 2023-04-19 NOTE — Progress Notes (Signed)
NAME:  Wendy Fleming, MRN:  [id number], DOB:  03/07/1941, LOS: 7 ADMISSION DATE:  04/12/2023, CONSULTATION DATE:  04/12/2023 REFERRING MD:  NSGY, CHIEF COMPLAINT:  AMS   History of Present Illness:  82 year old woman who presented to [facility] ED 8/30 after husband found her down minimally responsive.  Vomited multiple times en route to ER.  Intubated for airway protection in ER.  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  NSGY to place EVD and determine best method of securing aneurysm.  PCCM to consult for vent management.  [place] 3/4 mFS 2 (thin, +IVH)  Pertinent Medical History:  HTN Anemia  Significant Hospital Events: Including procedures, antibiotic start and stop dates in addition to other pertinent events   8/30 EVD placement in ER, admit Coil embolization Left Acomm aneurysm  8/31 Tolerating SBT, able to follow simple commands  9/1 intermittent episodes of agitation overnight, bilateral wrist restraints added, extubated 9/2 TCD remaining neg for vasospasm 9/4 Waxing/waning mental status, at times somnolent. EVD functioning well. 9/5 More somnolent, NSGY notified. EVD functioning. Na 129 (135). Foley placed for strict I&Os while correcting sodium, HTS 3% started. Febrile to 101.70F, PCT negative. LTM EEG per NSGY. 9/6 HTS 3% ongoing, Na corrected to 136.   Interim History / Subjective:  No significant events overnight Remains more somnolent LTM EEG without seizures Na corrected to 136 with hypertonic saline Repeating Noncon CT Head today Ongoing TCDs  Objective:  Blood pressure (!) 163/83, pulse 93, temperature 100.1 F (37.8 C), temperature source Axillary, resp. rate (!) 31, height 5\' 2"  (1.575 m), weight 85.9 kg, SpO2 100%.        Intake/Output Summary (Last 24 hours) at 04/19/2023 1009 Last data filed at 04/19/2023 0900 Gross per 24 hour  Intake 988.37 ml  Output 1881 ml  Net -892.63 ml   Filed Weights   04/17/23 0500 04/18/23 0419 04/19/23  0500  Weight: 86.2 kg 85.1 kg 85.9 kg   Physical Examination: General: Acutely ill-appearing elderly woman in NAD. Somnolent. HEENT: EVD in place with serosanguinous output. Anicteric sclera, pupils unequal (L 3mm, R 2mm) and reactive, dry mucous membranes. Neuro: Lethargic. Responds to noxious stimuli. Withdraws to pain in all 4 extremities. Following commands intermittently. Moves all 4 extremities spontaneously. +Cough and +Gag  CV: RRR, no m/g/r. PULM: Breathing shallow, but unlabored on 2LNC. Lung fields clear in upper fields. GI: Soft, nontender, nondistended. Normoactive bowel sounds. Extremities: Bilateral symmetric trace LE edema noted. Skin: Warm/dry, no rashes.  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 CT Head 9/2 shows slight improved ventriculomegaly, cytotoxic edema of anterior left basal ganglia consistent with ischemic infarct. - Management per NSGY - EVD in place - Frequent neurochecks - AEDs per NSGY (Keppra for seizure ppx) - LTM EEG without seizure - Repeat Noncon CT Head per NSGY - Continue nimotop, TCDs per protocol - HTS 3% (see below) - Seizure/aspiration precautions - Neuroprotective measures: HOB > 30 degrees, normoglycemia, normothermia, electrolytes WNL  - PT/OT/SLP  Acute hypoxemic respiratory failure due to Aspiration pneumonia  Extubated 9/1, sats 100% on 3LNC. CXR stable 9/4. PCT negative. - Continue supplemental O2 support - Wean O2 for sat > 90% - Pulmonary hygiene - S/p ceftriaxone x 5-day course - Intermittent CXR   Hyponatremia, improving; CSW versus ?SIADH Hypophosphatemia, improved Hypokalemia, improved Osm  282, urine Osm 765. - Trend BMP (Na Q4H) - Replete electrolytes as indicated - Monitor I&Os, Foley placed for strict  monitoring - Continue HTS 3% - Consider Florinef  Dysphagia Evaluated by SLP.  - Continue TF/supplements - SLP following, may required MBSS/FEEs once able to participate in care  Best Practice: (right click and "Reselect all SmartList Selections" daily)   Diet/type: NPO, tube feeds DVT prophylaxis: SCD, [place] heparin started 9/1 GI prophylaxis: PPI Lines: N/A Foley:  Yes, and it is still needed Code Status:  full code Last date of multidisciplinary goals of care discussion [Per Primary]  Critical care time:   The patient is critically ill with multiple organ system failure and requires high complexity decision making for assessment and support, frequent evaluation and titration of therapies, advanced monitoring, review of radiographic studies and interpretation of complex data.   Critical Care Time devoted to patient care services, exclusive of separately billable procedures, described in this note is 33 minutes.  Tim Lair, PA-C  Pulmonary & Critical Care 04/19/23 10:30 AM  Please see [email or web address] for pager details.  From 7A-7P if no response, please call [phone] After hours, please call ELink [phone]

## 2023-04-19 NOTE — Procedures (Signed)
Cortrak  Person Inserting Tube:  Kendell Bane C, RD Tube Type:  Cortrak - 43 inches Tube Size:  10 Tube Location:  Left nare Secured by: Bridle Technique Used to Measure Tube Placement:  Marking at nare/corner of mouth Cortrak Secured At:  64 cm   Cortrak Tube Team Note:  Consult received to place a Cortrak feeding tube.   X-ray is required, abdominal x-ray has been ordered by the Cortrak team. Please confirm tube placement before using the Cortrak tube.   If the tube becomes dislodged please keep the tube and contact the Cortrak team at [email or web address] for replacement.  If after hours and replacement cannot be delayed, place a NG tube and confirm placement with an abdominal x-ray.    Cammy Copa., RD, LDN, CNSC See AMiON for contact information

## 2023-04-20 DIAGNOSIS — E871 Hypo-osmolality and hyponatremia: Secondary | ICD-10-CM | POA: Diagnosis not present

## 2023-04-20 DIAGNOSIS — I619 Nontraumatic intracerebral hemorrhage, unspecified: Secondary | ICD-10-CM | POA: Diagnosis not present

## 2023-04-20 DIAGNOSIS — R4182 Altered mental status, unspecified: Secondary | ICD-10-CM | POA: Diagnosis not present

## 2023-04-20 DIAGNOSIS — R569 Unspecified convulsions: Secondary | ICD-10-CM | POA: Diagnosis not present

## 2023-04-20 LAB — BASIC METABOLIC PANEL
Anion gap: 13 (ref 5–15)
Anion gap: 9 (ref 5–15)
BUN: 16 mg/dL (ref 8–23)
BUN: 18 mg/dL (ref 8–23)
CO2: 21 mmol/L — ABNORMAL LOW (ref 22–32)
CO2: 20 mmol/L — ABNORMAL LOW (ref 22–32)
Calcium: 9.1 mg/dL (ref 8.9–10.3)
Calcium: 8.4 mg/dL — ABNORMAL LOW (ref 8.9–10.3)
Chloride: 101 mmol/L (ref 98–111)
Chloride: 112 mmol/L — ABNORMAL HIGH (ref 98–111)
Creatinine, Ser: 0.62 mg/dL (ref 0.44–1.00)
Creatinine, Ser: 0.56 mg/dL (ref 0.44–1.00)
GFR, Estimated: >60 mL/min (ref >60)
GFR, Estimated: >60 mL/min (ref >60)
Glucose, Bld: 146 mg/dL — ABNORMAL HIGH (ref 70–99)
Glucose, Bld: 119 mg/dL — ABNORMAL HIGH (ref 70–99)
Potassium: 3.7 mmol/L (ref 3.5–5.1)
Potassium: 3.8 mmol/L (ref 3.5–5.1)
Sodium: 135 mmol/L (ref 135–145)
Sodium: 141 mmol/L (ref 135–145)

## 2023-04-20 LAB — CBC
HCT: 36 % (ref 36.0–46.0)
Hemoglobin: 11.5 g/dL — ABNORMAL LOW (ref 12.0–15.0)
MCH: 29 pg (ref 26.0–34.0)
MCHC: 31.9 g/dL (ref 30.0–36.0)
MCV: 90.7 fL (ref 80.0–100.0)
Platelets: 263 10*3/uL (ref 150–400)
RBC: 3.97 MIL/uL (ref 3.87–5.11)
RDW: 14.1 % (ref 11.5–15.5)
WBC: 11.9 10*3/uL — ABNORMAL HIGH (ref 4.0–10.5)
nRBC: 0 % (ref 0.0–0.2)

## 2023-04-20 LAB — GLUCOSE, CAPILLARY
Glucose-Capillary: 119 mg/dL — ABNORMAL HIGH (ref 70–99)
Glucose-Capillary: 126 mg/dL — ABNORMAL HIGH (ref 70–99)
Glucose-Capillary: 131 mg/dL — ABNORMAL HIGH (ref 70–99)
Glucose-Capillary: 131 mg/dL — ABNORMAL HIGH (ref 70–99)
Glucose-Capillary: 132 mg/dL — ABNORMAL HIGH (ref 70–99)
Glucose-Capillary: 134 mg/dL — ABNORMAL HIGH (ref 70–99)

## 2023-04-20 NOTE — Progress Notes (Signed)
LTM maint complete - no skin breakdown under:  Fp1 Fp2 Fz  Serviced Cz and Pz [facility] monitored, Event button test confirmed by [facility].

## 2023-04-20 NOTE — Progress Notes (Signed)
Subjective: Patient reports  patient does not vocalize family says a little better this morning  Objective: Vital signs in last 24 hours: Temp:  [98.8 F (37.1 C)-101.1 F (38.4 C)] 101.1 F (38.4 C) (09/07 0400) Pulse Rate:  [76-101] 82 (09/07 0800) Resp:  [25-35] 27 (09/07 0800) BP: (97-164)/(43-117) 108/79 (09/07 0800) SpO2:  [91 %-100 %] 96 % (09/07 0800) Weight:  [85.7 kg] 85.7 kg (09/07 0500)  Intake/Output from previous day: 09/06 0701 - 09/07 0700 In: 1496.3 [I.V.:496.3; NG/GT:1000] Out: 2445 [Urine:1750; Drains:145; Stool:550] Intake/Output this shift: Total I/O In: 60.1 [I.V.:20.1; NG/GT:40] Out: -   Awake neurologically essentially unchanged EVD functioning well  Lab Results: Recent Labs    04/19/23 0516 04/20/23 0503  WBC 11.2* 11.9*  HGB 12.1 11.5*  HCT 37.3 36.0  PLT 242 263   BMET Recent Labs    04/19/23 2154 04/20/23 0503  NA 134* 135  K 3.5 3.7  CL 107 101  CO2 21* 21*  GLUCOSE 120* 146*  BUN 17 16  CREATININE 0.57 0.62  CALCIUM 8.0* 9.1    Studies/Results: VAS [place] TRANSCRANIAL DOPPLER  Result Date: 04/19/2023  Transcranial Doppler Patient Name:  Wendy Fleming  Date of Exam:   04/19/2023 Medical Rec #: [id number]         Accession #:    [id number] Date of Birth: [birth year]         Patient Gender: F Patient Age:   82 years Exam Location:  [facility] Procedure:      VAS [place] TRANSCRANIAL DOPPLER Referring Phys: Wendy Fleming --------------------------------------------------------------------------------  Indications: Subarachnoid hemorrhage. Limitations: Limited transtemporal windows, obrtucted by electrodes, and unable              to move patient head for access to foramen window Comparison Study: 04/17/23 Performing Technologist: Wendy Fleming  Examination Guidelines: A complete evaluation includes B-mode imaging, spectral Doppler, color Doppler, and power Doppler as needed of all accessible portions of each vessel. Bilateral testing is  considered an integral part of a complete examination. Limited examinations for reoccurring indications may be performed as noted.  +----------+---------------+----------+-----------+-------+ RIGHT TCD Right VM (cm/s)Depth (cm)PulsatilityComment +----------+---------------+----------+-----------+-------+ ACA             24                    1.36            +----------+---------------+----------+-----------+-------+ Opthalmic       18                    1.23            +----------+---------------+----------+-----------+-------+ ICA siphon      21                    1.59            +----------+---------------+----------+-----------+-------+  +----------+--------------+----------+-----------+-------+ LEFT TCD  Left VM (cm/s)Depth (cm)PulsatilityComment +----------+--------------+----------+-----------+-------+ ACA             23                   1.12            +----------+--------------+----------+-----------+-------+ Opthalmic       17                   1.13            +----------+--------------+----------+-----------+-------+ ICA siphon      20  1.51            +----------+--------------+----------+-----------+-------+     Preliminary    DG Abd Portable 1V  Result Date: 04/19/2023 CLINICAL DATA:  Feeding tube placement. EXAM: PORTABLE ABDOMEN - 1 VIEW COMPARISON:  04/14/2023 FINDINGS: Feeding tube tip in the region of the gastric pylorus or duodenal bulb. The included bowel-gas pattern is normal. Stable enlarged heart and prominent interstitial markings. Cholecystectomy clips. Thoracic and lumbar spine degenerative changes. IMPRESSION: Feeding tube tip in the region of the gastric pylorus or duodenal bulb. Electronically Signed   By: Wendy Fleming M.D.   On: 04/19/2023 12:25   Overnight EEG with video  Result Date: 04/19/2023 Wendy Quest, MD     04/20/2023  8:26 AM [name] MRN: [id number] Epilepsy Attending: Charlsie Fleming Referring Physician/Provider: Lisbeth Renshaw, MD Duration: 04/18/2023 1015 to 04/19/2023 1015 Patient history: 82 y.o. 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 EEG to evaluate for seizure. Level of alertness:lethargic AEDs during EEG study: LEV Technical aspects: This EEG study was done with scalp electrodes positioned according to the 10-20 International system of electrode placement. Electrical activity was reviewed with band pass filter of 1-70Hz , sensitivity of 7 uV/mm, display speed of 42mm/sec with a 60Hz  notched filter applied as appropriate. EEG data were recorded continuously and digitally stored.  Video monitoring was available and reviewed as appropriate. Description: EEG showed continuous generalized 3-5Hz  theta- delta slowing.  Generalized periodic discharges with triphasic morphology at 1 Hz were noted, predominantly when awake/stimulated.  Hyperventilation and photic stimulation were not performed.   ABNORMALITY -Periodic discharges with triphasic morphology , generalized -Continuous slow, generalized IMPRESSION: This study showed generalized periodic discharges with triphasic morphology which can be on the ictal-interictal continuum.  However the morphology, frequency and reactivity to stimulation is more commonly suggestive of toxic-metabolic etiology. Additionally there is moderate diffuse encephalopathy.  No seizures were seen throughout the recording. Priyanka Annabelle Fleming    Assessment/Plan: Status post aneurysm coiling with external ventricular drain for intraventricular hemorrhage drain functioning well bloody CSF drainage patient continuing to get active EEG monitoring when this is discontinued will follow-up CT of her head continue EVD drainage noted new neurosurgical recommendations at this time  LOS: 8 days     Wendy Fleming 04/20/2023, 8:29 AM

## 2023-04-20 NOTE — Progress Notes (Addendum)
NAME:  Wendy Fleming, MRN:  [id number], DOB:  [birth year], LOS: 8 ADMISSION DATE:  04/12/2023, CONSULTATION DATE:  04/12/2023 REFERRING MD:  NSGY, CHIEF COMPLAINT:  AMS   History of Present Illness:  82 year old woman who presented to [facility] ED 8/30 after husband found her down minimally responsive.  Vomited multiple times en route to ER.  Intubated for airway protection in ER.  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  NSGY to place EVD and determine best method of securing aneurysm.  PCCM to consult for vent management.  Hunt Hess 3/4 mFS 2 (thin, +IVH)  Pertinent Medical History:  HTN Anemia  Significant Hospital Events: Including procedures, antibiotic start and stop dates in addition to other pertinent events   8/30 EVD placement in ER, admit Coil embolization Left Acomm aneurysm  8/31 Tolerating SBT, able to follow simple commands  9/1 intermittent episodes of agitation overnight, bilateral wrist restraints added, extubated 9/2 TCD remaining neg for vasospasm 9/4 Waxing/waning mental status, at times somnolent. EVD functioning well. 9/5 More somnolent, NSGY notified. EVD functioning. Na 129 (135). Foley placed for strict I&Os while correcting sodium, HTS 3% started. Febrile to 101.27F, PCT negative. LTM EEG per NSGY. 9/6 HTS 3% ongoing, Na corrected to 136.   Interim History / Subjective:  No significant events overnight Remains more somnolent LTM EEG without seizures Na corrected to 136 with hypertonic saline Repeating Noncon CT Head today Ongoing TCDs  Objective:  Blood pressure (!) 133/94, pulse 81, temperature (!) 101.1 F (38.4 C), temperature source Axillary, resp. rate (!) 27, height 5\' 2"  (1.575 m), weight 85.7 kg, SpO2 95%.        Intake/Output Summary (Last 24 hours) at 04/20/2023 0735 Last data filed at 04/20/2023 0700 Gross per 24 hour  Intake 1436.24 ml  Output 2445 ml  Net -1008.76 ml   Filed Weights   04/18/23 0419 04/19/23 0500  04/20/23 0500  Weight: 85.1 kg 85.9 kg 85.7 kg   Physical Exam: General: Chronically-appearing, no acute distress, drowsy but will open eyes with stimulation and voice HENT: EEG leads on head, EVD in place, [place], AT, OP clear, MMM Eyes: EOMI, no scleral icterus Respiratory: Clear to auscultation bilaterally.  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 CT Head 9/2 shows slight improved ventriculomegaly, cytotoxic edema of anterior left basal ganglia consistent with ischemic infarct. - Management per NSGY - EVD in place - Frequent neurochecks - AEDs per NSGY (Keppra for seizure ppx) - LTM EEG without seizure - Repeat Noncon CT Head per NSGY - Continue nimotop, TCDs pending - HTS 3% (see below) - Seizure/aspiration precautions - Neuroprotective measures: HOB > 30 degrees, normoglycemia, normothermia, electrolytes WNL  - Continue PT/OT/SLP  Acute hypoxemic respiratory failure due to Aspiration pneumonia  Extubated 9/1, sats 100% on 3LNC. CXR stable 9/4. PCT negative. - Continue supplemental O2 support. Currently on room air - Goal sat > 90% - Pulmonary hygiene - S/p ceftriaxone x 5-day course - Intermittent CXR   Hyponatremia, improving; CSW versus ?SIADH Hypophosphatemia, improved Hypokalemia, improved Osm 282, urine Osm 765. - Trend BMET (Na Q6H). Goal Na 135-145 -  Replete electrolytes as indicated - Monitor I&Os, Foley placed for strict monitoring - Continue HTS 3%, initiated on 9/5.  - Consider Florinef  Dysphagia Evaluated by SLP.  - Continue  TF/supplements - SLP following, may required MBSS/FEEs once able to participate in care  Updated family at bedside  Best Practice: (right click and "Reselect all SmartList Selections" daily)   Diet/type: NPO, tube feeds DVT prophylaxis: SCD, [place] heparin started 9/1 GI prophylaxis: PPI Lines: N/A Foley:  Yes, and it is still needed Code Status:  full code Last date of multidisciplinary goals of care discussion [Per Primary]  Critical care time:   The patient is critically ill with SAH s/p EVD and requires high complexity decision making for assessment and support, frequent evaluation and titration of therapies, application of advanced monitoring technologies and extensive interpretation of multiple databases.  Independent Critical Care Time: 30 Minutes.   Mechele Collin, M.D. [facility] Pulmonary/Critical Care Medicine 04/20/2023 7:35 AM   Please see Amion for pager number to reach on-call Pulmonary and Critical Care Team.

## 2023-04-20 NOTE — Progress Notes (Signed)
LTM maint complete - no skin breakdown under:  FP1 FP2 [place]. Bundled leads. Continue to monitor

## 2023-04-20 NOTE — Procedures (Addendum)
[name]  MRN: [id number]  Epilepsy Attending: [name]  Referring Physician/Provider: Lisbeth Renshaw, MD  Duration: 04/19/2023 1015 to 04/20/2023 1015   Patient history: 82 y.o. 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 EEG to evaluate for seizure.   Level of alertness: lethargic    AEDs during EEG study: LEV   Technical aspects: This EEG study was done with scalp electrodes positioned according to the 10-20 International system of electrode placement. Electrical activity was reviewed with band pass filter of 1-70Hz , sensitivity of 7 uV/mm, display speed of 23mm/sec with a 60Hz  notched filter applied as appropriate. EEG data were recorded continuously and digitally stored.  Video monitoring was available and reviewed as appropriate.   Description: EEG showed continuous generalized 3- 7Hz  theta-delta slowing. Generalized and at times more prominent in right hemisphere periodic discharges with triphasic morphology at 1 Hz were noted, predominantly when awake/stimulated.  Hyperventilation and photic stimulation were not performed.     Event button was pressed on 04/19/2023 at 1054 and 1710 for left arm twitching. Concomitant EEG before, during and after the event didn't show any definite EEG change.   ABNORMALITY -Periodic discharges with triphasic morphology, generalized  -Continuous slow, generalized   IMPRESSION: This study showed periodic discharges with triphasic morphology which can be on the ictal-interictal continuum.  However the morphology, frequency and reactivity to stimulation is more commonly suggestive of toxic-metabolic etiology. Additionally there is moderate diffuse encephalopathy.  No definite seizures were seen throughout the recording.  Event button was pressed on 04/19/2023 at 1054 and 1710 for left arm twitching without definite concomitant EEG  change. However, focal motor seizures may not be seen on scalp EEG. Clinical correlation is recommended.    Wendy Fleming

## 2023-04-21 ENCOUNTER — Inpatient Hospital Stay (HOSPITAL_COMMUNITY): Payer: Medicare HMO

## 2023-04-21 DIAGNOSIS — R4182 Altered mental status, unspecified: Secondary | ICD-10-CM | POA: Diagnosis not present

## 2023-04-21 DIAGNOSIS — I619 Nontraumatic intracerebral hemorrhage, unspecified: Secondary | ICD-10-CM | POA: Diagnosis not present

## 2023-04-21 DIAGNOSIS — R569 Unspecified convulsions: Secondary | ICD-10-CM | POA: Diagnosis not present

## 2023-04-21 LAB — CBC
HCT: 39.4 % (ref 36.0–46.0)
Hemoglobin: 12.4 g/dL (ref 12.0–15.0)
MCH: 28.6 pg (ref 26.0–34.0)
MCHC: 31.5 g/dL (ref 30.0–36.0)
MCV: 90.8 fL (ref 80.0–100.0)
Platelets: 299 10*3/uL (ref 150–400)
RBC: 4.34 MIL/uL (ref 3.87–5.11)
RDW: 14.3 % (ref 11.5–15.5)
WBC: 14.3 10*3/uL — ABNORMAL HIGH (ref 4.0–10.5)
nRBC: 0.1 % (ref 0.0–0.2)

## 2023-04-21 LAB — BASIC METABOLIC PANEL
Anion gap: 13 (ref 5–15)
BUN: 19 mg/dL (ref 8–23)
CO2: 20 mmol/L — ABNORMAL LOW (ref 22–32)
Calcium: 9.2 mg/dL (ref 8.9–10.3)
Chloride: 101 mmol/L (ref 98–111)
Creatinine, Ser: 0.61 mg/dL (ref 0.44–1.00)
GFR, Estimated: >60 mL/min (ref >60)
Glucose, Bld: 136 mg/dL — ABNORMAL HIGH (ref 70–99)
Potassium: 3.9 mmol/L (ref 3.5–5.1)
Sodium: 134 mmol/L — ABNORMAL LOW (ref 135–145)

## 2023-04-21 LAB — GLUCOSE, CAPILLARY
Glucose-Capillary: 137 mg/dL — ABNORMAL HIGH (ref 70–99)
Glucose-Capillary: 138 mg/dL — ABNORMAL HIGH (ref 70–99)
Glucose-Capillary: 136 mg/dL — ABNORMAL HIGH (ref 70–99)
Glucose-Capillary: 147 mg/dL — ABNORMAL HIGH (ref 70–99)
Glucose-Capillary: 133 mg/dL — ABNORMAL HIGH (ref 70–99)

## 2023-04-21 MED ORDER — SODIUM CHLORIDE 0.9 % IV SOLN: INTRAVENOUS | Status: DC

## 2023-04-21 MED ORDER — TRAMADOL HCL 50 MG PO TABS: 50.0000 mg | ORAL_TABLET | Freq: Four times a day (QID) | ORAL | Status: DC | PRN

## 2023-04-21 NOTE — Progress Notes (Signed)
eLink Physician-Brief Progress Note [name] DOB: [birth year] MRN: [id number]   Date of Service  04/21/2023  HPI/Events of Note  82 year old female that initially presented with a nontraumatic subarachnoid hemorrhage with a ruptured ACOM aneurysm with intraventricular hemorrhage status post coil embolization on 8/30 who was previously intubated and extubated on 9/1 with ongoing hypoxemic respiratory failure.  Concern that the patient is tachypneic with Cheyne-Stokes breathing.  Patient has had a consistent and unchanged breathing pattern with recorded tachypnea for several days which appears to be stable and unchanged.  The patient is saturating 96%.  eICU Interventions  Patient is currently full code.  At bedside, no evidence of seizure activity, protecting her airway appropriately, definitely tachypneic but this may be consistent with her known neurological injury.  No indication for change in respiratory support devices at this time.  Continue observation     Intervention Category Intermediate Interventions: Respiratory distress - evaluation and management  Shalik Sanfilippo 04/21/2023, 8:30 PM

## 2023-04-21 NOTE — Progress Notes (Signed)
NAME:  FUMIYO HUGULEY, MRN:  [id number], DOB:  [birth year], LOS: 9 ADMISSION DATE:  04/12/2023, CONSULTATION DATE:  04/12/2023 REFERRING MD:  NSGY, CHIEF COMPLAINT:  AMS   History of Present Illness:  82 year old woman who presented to [facility] ED 8/30 after husband found her down minimally responsive.  Vomited multiple times en route to ER.  Intubated for airway protection in ER.  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  NSGY to place EVD and determine best method of securing aneurysm.  PCCM to consult for vent management.  Hunt Hess 3/4 mFS 2 (thin, +IVH)  Pertinent Medical History:  HTN Anemia  Significant Hospital Events: Including procedures, antibiotic start and stop dates in addition to other pertinent events   8/30 EVD placement in ER, admit Coil embolization Left Acomm aneurysm  8/31 Tolerating SBT, able to follow simple commands  9/1 intermittent episodes of agitation overnight, bilateral wrist restraints added, extubated 9/2 TCD remaining neg for vasospasm 9/4 Waxing/waning mental status, at times somnolent. EVD functioning well. 9/5 More somnolent, NSGY notified. EVD functioning. Na 129 (135). Foley placed for strict I&Os while correcting sodium, HTS 3% started. Febrile to 101.65F, PCT negative. LTM EEG per NSGY. 9/6 HTS 3% ongoing, Na corrected to 136.   Interim History / Subjective:  Waxing and waning mental status Daughters at bedside  Objective:  Blood pressure 129/78, pulse 81, temperature 99.6 F (37.6 C), temperature source Axillary, resp. rate (!) 34, height 5\' 2"  (1.575 m), weight 86.6 kg, SpO2 97%.        Intake/Output Summary (Last 24 hours) at 04/21/2023 1325 Last data filed at 04/21/2023 0900 Gross per 24 hour  Intake 868.26 ml  Output 1617.5 ml  Net -749.24 ml   Filed Weights   04/19/23 0500 04/20/23 0500 04/21/23 0600  Weight: 85.9 kg 85.7 kg 86.6 kg   Physical Exam: General: Chronically ill-appearing, no acute distress,  encephalopathic, drowsy but eyes open and follows commands HENT: [place], EVD in place, OP clear, MMM Eyes: EOMI, no scleral icterus Respiratory: Clear to auscultation bilaterally.  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 CT Head 9/2 shows slight improved ventriculomegaly, cytotoxic edema of anterior left basal ganglia consistent with ischemic infarct. - Management per NSGY - EVD in place - Frequent neurochecks - AEDs per NSGY (Keppra for seizure ppx) - LTM EEG without seizure - Repeat Noncon CT Head per NSGY tomorrow morning - Continue nimotop, TCDs pending - Seizure/aspiration precautions - Neuroprotective measures: HOB > 30 degrees, normoglycemia, normothermia, electrolytes WNL  - Continue PT/OT/SLP  Acute hypoxemic respiratory failure due to Aspiration pneumonia  Extubated 9/1, sats 100% on 3LNC. CXR stable 9/4. PCT negative. - Continue supplemental O2 support. Currently on room air - Goal sat > 90% - Pulmonary hygiene - S/p ceftriaxone x 5-day course - Intermittent CXR   Hyponatremia, improving; CSW versus ?SIADH Hypophosphatemia, improved Hypokalemia, improved Osm 282, urine Osm 765. - Replete electrolytes as indicated - Monitor I&Os, Foley placed for strict monitoring - Previously on HTS 3%, initiated on 9/5-9/7 - Started NS @ 50cc for Na goal 135-145. Trend BMET - Consider Florinef  Dysphagia Evaluated by SLP.  -  Continue TF/supplements - SLP following, may required MBSS/FEEs once able to participate in care  Updated family at bedside  Best Practice: (right  click and "Reselect all SmartList Selections" daily)   Diet/type: NPO, tube feeds DVT prophylaxis: SCD, [place] heparin started 9/1 GI prophylaxis: PPI Lines: N/A Foley:  Yes, and it is still needed Code Status:  full code Last date of multidisciplinary goals of care discussion [Per Primary]  Critical care time:   The patient is critically ill with multiple organ systems failure and requires high complexity decision making for assessment and support, frequent evaluation and titration of therapies, application of advanced monitoring technologies and extensive interpretation of multiple databases.  Independent Critical Care Time: 35 Minutes.   Mechele Collin, M.D. [facility] Pulmonary/Critical Care Medicine 04/21/2023 1:25 PM   Please see Amion for pager number to reach on-call Pulmonary and Critical Care Team.

## 2023-04-21 NOTE — Progress Notes (Signed)
EEG maint complete.  ?

## 2023-04-21 NOTE — Progress Notes (Signed)
Patient ID: Wendy Fleming, female   DOB: June 28, 1941, 82 y.o.   MRN: [id number] Patient remains somnolent difficult to arouse intermittently will wake up and move however no significant change.  Patient maintains on continuous EEG.  External ventricular drain maintained steady output.  CT scan in the morning.  Drain management per Dr. Conchita Paris

## 2023-04-21 NOTE — Plan of Care (Signed)
  Problem: Education: Goal: Knowledge of disease or condition will improve Outcome: Progressing Goal: Knowledge of secondary prevention will improve (MUST DOCUMENT ALL) Outcome: Progressing   Problem: Self-Care: Goal: Ability to participate in self-care as condition permits will improve Outcome: Progressing

## 2023-04-21 NOTE — Procedures (Signed)
[name]  MRN: [id number]  Epilepsy Attending: [name]  Referring Physician/Provider: Lisbeth Renshaw, MD  Duration: 04/20/2023 1015 to 04/21/2023 1015   Patient history: 82 y.o. 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 EEG to evaluate for seizure.   Level of alertness: lethargic , asleep   AEDs during EEG study: LEV   Technical aspects: This EEG study was done with scalp electrodes positioned according to the 10-20 International system of electrode placement. Electrical activity was reviewed with band pass filter of 1-70Hz , sensitivity of 7 uV/mm, display speed of 63mm/sec with a 60Hz  notched filter applied as appropriate. EEG data were recorded continuously and digitally stored.  Video monitoring was available and reviewed as appropriate.   Description: No posterior dominant rhythm was seen. Sleep was characterized by sleep spindles (12 to 14 Hz), maximal frontocentral region. EEG showed continuous generalized 3- 7Hz  theta-delta slowing, at times with triphasic morphology. Hyperventilation and photic stimulation were not performed.      ABNORMALITY -Continuous slow, generalized   IMPRESSION: This study showed is suggestive of moderate diffuse encephalopathy.  No seizures or epileptiform discharges were seen throughout the recording.   Lilyauna Miedema Annabelle Harman

## 2023-04-22 ENCOUNTER — Inpatient Hospital Stay (HOSPITAL_COMMUNITY): Payer: Medicare HMO

## 2023-04-22 DIAGNOSIS — I609 Nontraumatic subarachnoid hemorrhage, unspecified: Secondary | ICD-10-CM

## 2023-04-22 DIAGNOSIS — R4182 Altered mental status, unspecified: Secondary | ICD-10-CM | POA: Diagnosis not present

## 2023-04-22 DIAGNOSIS — R569 Unspecified convulsions: Secondary | ICD-10-CM | POA: Diagnosis not present

## 2023-04-22 DIAGNOSIS — I619 Nontraumatic intracerebral hemorrhage, unspecified: Secondary | ICD-10-CM | POA: Diagnosis not present

## 2023-04-22 DIAGNOSIS — J9601 Acute respiratory failure with hypoxia: Secondary | ICD-10-CM | POA: Diagnosis not present

## 2023-04-22 LAB — POCT I-STAT 7, (LYTES, BLD GAS, ICA,H+H)
Acid-base deficit: 3 mmol/L — ABNORMAL HIGH (ref 0.0–2.0)
Acid-base deficit: 6 mmol/L — ABNORMAL HIGH (ref 0.0–2.0)
Bicarbonate: 20.9 mmol/L (ref 20.0–28.0)
Bicarbonate: 17.7 mmol/L — ABNORMAL LOW (ref 20.0–28.0)
Calcium, Ion: 1.3 mmol/L (ref 1.15–1.40)
Calcium, Ion: 1.34 mmol/L (ref 1.15–1.40)
HCT: 36 % (ref 36.0–46.0)
HCT: 31 % — ABNORMAL LOW (ref 36.0–46.0)
Hemoglobin: 12.2 g/dL (ref 12.0–15.0)
Hemoglobin: 10.5 g/dL — ABNORMAL LOW (ref 12.0–15.0)
O2 Saturation: 100 %
O2 Saturation: 99 %
Patient temperature: 98.1
Patient temperature: 98.6
Potassium: 3.6 mmol/L (ref 3.5–5.1)
Potassium: 3.5 mmol/L (ref 3.5–5.1)
Sodium: 135 mmol/L (ref 135–145)
Sodium: 137 mmol/L (ref 135–145)
TCO2: 22 mmol/L (ref 22–32)
TCO2: 19 mmol/L — ABNORMAL LOW (ref 22–32)
pCO2 arterial: 32.5 mmHg (ref 32–48)
pCO2 arterial: 28.3 mmHg — ABNORMAL LOW (ref 32–48)
pH, Arterial: 7.415 (ref 7.35–7.45)
pH, Arterial: 7.404 (ref 7.35–7.45)
pO2, Arterial: 346 mmHg — ABNORMAL HIGH (ref 83–108)
pO2, Arterial: 132 mmHg — ABNORMAL HIGH (ref 83–108)

## 2023-04-22 LAB — BASIC METABOLIC PANEL
Anion gap: 13 (ref 5–15)
BUN: 19 mg/dL (ref 8–23)
CO2: 18 mmol/L — ABNORMAL LOW (ref 22–32)
Calcium: 9.1 mg/dL (ref 8.9–10.3)
Chloride: 104 mmol/L (ref 98–111)
Creatinine, Ser: 0.64 mg/dL (ref 0.44–1.00)
GFR, Estimated: >60 mL/min (ref >60)
Glucose, Bld: 138 mg/dL — ABNORMAL HIGH (ref 70–99)
Potassium: 3.8 mmol/L (ref 3.5–5.1)
Sodium: 135 mmol/L (ref 135–145)

## 2023-04-22 LAB — CBC
HCT: 37.8 % (ref 36.0–46.0)
Hemoglobin: 12.2 g/dL (ref 12.0–15.0)
MCH: 29.8 pg (ref 26.0–34.0)
MCHC: 32.3 g/dL (ref 30.0–36.0)
MCV: 92.4 fL (ref 80.0–100.0)
Platelets: 268 10*3/uL (ref 150–400)
RBC: 4.09 MIL/uL (ref 3.87–5.11)
RDW: 14.3 % (ref 11.5–15.5)
WBC: 14.9 10*3/uL — ABNORMAL HIGH (ref 4.0–10.5)
nRBC: 0 % (ref 0.0–0.2)

## 2023-04-22 LAB — GLUCOSE, CAPILLARY
Glucose-Capillary: 109 mg/dL — ABNORMAL HIGH (ref 70–99)
Glucose-Capillary: 112 mg/dL — ABNORMAL HIGH (ref 70–99)
Glucose-Capillary: 120 mg/dL — ABNORMAL HIGH (ref 70–99)
Glucose-Capillary: 123 mg/dL — ABNORMAL HIGH (ref 70–99)
Glucose-Capillary: 150 mg/dL — ABNORMAL HIGH (ref 70–99)
Glucose-Capillary: 159 mg/dL — ABNORMAL HIGH (ref 70–99)

## 2023-04-22 LAB — MRSA NEXT GEN BY PCR, NASAL: MRSA by PCR Next Gen: NOT DETECTED

## 2023-04-22 MED ORDER — MIDAZOLAM HCL 2 MG/2ML IJ SOLN
1.0000 mg | INTRAMUSCULAR | Status: DC | PRN
  Administered 2023-04-22: 1 mg via INTRAVENOUS

## 2023-04-22 MED ORDER — DOCUSATE SODIUM 50 MG/5ML PO LIQD
100.0000 mg | Freq: Two times a day (BID) | ORAL | Status: DC
  Administered 2023-04-29: 100 mg
  Filled 2023-04-22 (×3): qty 10

## 2023-04-22 MED ORDER — VANCOMYCIN HCL 1500 MG/300ML IV SOLN
1500.0000 mg | Freq: Once | INTRAVENOUS | Status: AC
  Administered 2023-04-22: 1500 mg via INTRAVENOUS
  Filled 2023-04-22: qty 300

## 2023-04-22 MED ORDER — SODIUM CHLORIDE 0.9 % IV SOLN
1.0000 g | Freq: Three times a day (TID) | INTRAVENOUS | Status: DC
  Administered 2023-04-22 – 2023-04-23 (×3): 1 g via INTRAVENOUS
  Filled 2023-04-22 (×3): qty 20

## 2023-04-22 MED ORDER — LACTATED RINGERS IV BOLUS
1000.0000 mL | Freq: Once | INTRAVENOUS | Status: AC
  Administered 2023-04-22: 1000 mL via INTRAVENOUS

## 2023-04-22 MED ORDER — ETOMIDATE 2 MG/ML IV SOLN
20.0000 mg | Freq: Once | INTRAVENOUS | Status: AC
  Administered 2023-04-22: 20 mg via INTRAVENOUS
  Filled 2023-04-22: qty 10

## 2023-04-22 MED ORDER — FENTANYL CITRATE PF 50 MCG/ML IJ SOSY
25.0000 ug | PREFILLED_SYRINGE | INTRAMUSCULAR | Status: DC | PRN
  Administered 2023-04-23: 25 ug via INTRAVENOUS
  Filled 2023-04-22: qty 1

## 2023-04-22 MED ORDER — ORAL CARE MOUTH RINSE
15.0000 mL | OROMUCOSAL | Status: DC
  Administered 2023-04-22 – 2023-05-01 (×103): 15 mL via OROMUCOSAL

## 2023-04-22 MED ORDER — ORAL CARE MOUTH RINSE: 15.0000 mL | OROMUCOSAL | Status: DC | PRN

## 2023-04-22 MED ORDER — NOREPINEPHRINE 4 MG/250ML-% IV SOLN
0.0000 ug/min | INTRAVENOUS | Status: DC
  Administered 2023-04-22: 2 ug/min via INTRAVENOUS
  Filled 2023-04-22: qty 250

## 2023-04-22 MED ORDER — SODIUM CHLORIDE 0.9 % IV SOLN
2.0000 g | Freq: Three times a day (TID) | INTRAVENOUS | Status: DC
  Filled 2023-04-22 (×2): qty 2

## 2023-04-22 MED ORDER — FENTANYL CITRATE PF 50 MCG/ML IJ SOSY
100.0000 ug | PREFILLED_SYRINGE | Freq: Once | INTRAMUSCULAR | Status: AC
  Administered 2023-04-22: 100 ug via INTRAVENOUS
  Filled 2023-04-22: qty 2

## 2023-04-22 MED ORDER — FENTANYL CITRATE PF 50 MCG/ML IJ SOSY
25.0000 ug | PREFILLED_SYRINGE | INTRAMUSCULAR | Status: DC | PRN
  Administered 2023-04-22 – 2023-04-23 (×3): 50 ug via INTRAVENOUS
  Filled 2023-04-22 (×3): qty 1

## 2023-04-22 MED ORDER — ROCURONIUM BROMIDE 50 MG/5ML IV SOLN
90.0000 mg | Freq: Once | INTRAVENOUS | Status: DC
  Filled 2023-04-22: qty 9

## 2023-04-22 MED ORDER — DEXMEDETOMIDINE HCL IN NACL 400 MCG/100ML IV SOLN
0.0000 ug/kg/h | INTRAVENOUS | Status: DC
  Administered 2023-04-22 (×2): 0.4 ug/kg/h via INTRAVENOUS
  Administered 2023-04-23: 0.6 ug/kg/h via INTRAVENOUS
  Administered 2023-04-23 – 2023-04-24 (×3): 0.4 ug/kg/h via INTRAVENOUS
  Administered 2023-04-25: 0.5 ug/kg/h via INTRAVENOUS
  Administered 2023-04-26: 0.3 ug/kg/h via INTRAVENOUS
  Administered 2023-04-26: 0.8 ug/kg/h via INTRAVENOUS
  Administered 2023-04-27: 0.2 ug/kg/h via INTRAVENOUS
  Administered 2023-04-28: 0.5 ug/kg/h via INTRAVENOUS
  Filled 2023-04-22 (×13): qty 100

## 2023-04-22 MED ORDER — ROCURONIUM BROMIDE 10 MG/ML (PF) SYRINGE
90.0000 mg | PREFILLED_SYRINGE | Freq: Once | INTRAVENOUS | Status: DC
  Filled 2023-04-22: qty 9
  Filled 2023-04-22: qty 10

## 2023-04-22 MED ORDER — FAMOTIDINE 20 MG PO TABS: 20.0000 mg | ORAL_TABLET | Freq: Two times a day (BID) | ORAL | Status: DC

## 2023-04-22 MED ORDER — MIDAZOLAM HCL 2 MG/2ML IJ SOLN
2.0000 mg | Freq: Once | INTRAMUSCULAR | Status: DC
  Filled 2023-04-22: qty 2

## 2023-04-22 MED ORDER — POLYETHYLENE GLYCOL 3350 17 G PO PACK: 17.0000 g | PACK | Freq: Every day | ORAL | Status: DC

## 2023-04-22 NOTE — Progress Notes (Signed)
eLink Physician-Brief Progress Note [name] DOB: August 18, 1940 MRN: [id number]   Date of Service  04/22/2023  HPI/Events of Note  82 year old admitted with aneurysmal subarachnoid hemorrhage, intubated earlier for airway protection with concern for acute aspiration associated sepsis.  eICU Interventions  Already on appropriate antibiotics  ABG reviewed-appropriate ventilation and oxygenation  Stable minimal vasopressor requirements   0308 - I/O cath per protocol. Low uop tonight  Intervention Category Intermediate Interventions: Respiratory distress - evaluation and management  Donnis Pecha 04/22/2023, 9:09 PM

## 2023-04-22 NOTE — Progress Notes (Addendum)
2000 - Upon initial patient assessment, the patient was tachypneic. The patient's SpO2% was in the high 90's with no new neuro changes upon exam. E-link notified. No new orders.    0000 - Pt to CT and back. Patient is still tachypneic and now has an SpO2% in the high 80's to low 90's. Applied nasal cannula at 2L. Notified e-link. No new orders. Will continue to monitor and assess the patient for any neuro or respiratory changes.   0400 - Notified e-link of going up to 3L nasal cannula d/t SpO2% in the mid to high 80's. No new orders. Will continue to monitor and assess the patient for any neuro or respiratory changes.

## 2023-04-22 NOTE — Progress Notes (Signed)
vLTM maintenance  All impedances below 10k  No skin breakdown noted at  01  02  A2  F8

## 2023-04-22 NOTE — Progress Notes (Signed)
PT Cancellation Note  Patient Details Name: Wendy Fleming MRN: [id number] DOB: 1940/10/01   Cancelled Treatment:    Reason Eval/Treat Not Completed: Medical issues which prohibited therapy (Spoke to RN who reports worsening respiratory status and possible intubation.  Will hold PT.)   Wynn Maudlin, DPT Acute Rehabilitation Services Office [phone]  04/22/23 10:12 AM

## 2023-04-22 NOTE — Procedures (Addendum)
[name]  MRN: [id number]  Epilepsy Attending: [name]  Referring Physician/Provider: Lisbeth Renshaw, MD  Duration: 04/21/2023 1015 to 04/22/2023 1038   Patient history: 82 y.o. 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 EEG to evaluate for seizure.   Level of alertness: lethargic , asleep   AEDs during EEG study: LEV   Technical aspects: This EEG study was done with scalp electrodes positioned according to the 10-20 International system of electrode placement. Electrical activity was reviewed with band pass filter of 1-70Hz , sensitivity of 7 uV/mm, display speed of 68mm/sec with a 60Hz  notched filter applied as appropriate. EEG data were recorded continuously and digitally stored.  Video monitoring was available and reviewed as appropriate.   Description: No posterior dominant rhythm was seen. Sleep was characterized by sleep spindles (12 to 14 Hz), maximal frontocentral region. EEG showed continuous generalized 3- 7Hz  theta-delta slowing. Hyperventilation and photic stimulation were not performed.      ABNORMALITY -Continuous slow, generalized   IMPRESSION: This study showed is suggestive of moderate diffuse encephalopathy.  No seizures or epileptiform discharges were seen throughout the recording.   [name]

## 2023-04-22 NOTE — Progress Notes (Signed)
SLP Cancellation Note  Patient Details Name: Wendy Fleming MRN: [id number] DOB: 06/01/1941   Cancelled treatment:       Reason Eval/Treat Not Completed: Medical issues which prohibited therapy. Pt with respiratory decline and per RN, being intubated. Will f/u as able.     Mahala Menghini., M.A. CCC-SLP Acute Rehabilitation Services Office [phone]  Secure chat preferred  04/22/2023, 11:17 AM

## 2023-04-22 NOTE — Progress Notes (Signed)
LTM EEG discontinued - no skin breakdown at unhook.   

## 2023-04-22 NOTE — Progress Notes (Signed)
  NEUROSURGERY PROGRESS NOTE   Pt seen and examined. Pt noted to be tachypneic, hypoxic early this am.  EXAM: Temp:  [98.6 F (37 C)-101.6 F (38.7 C)] 101.6 F (38.7 C) (09/09 0800) Pulse Rate:  [81-109] 106 (09/09 0800) Resp:  [23-44] 43 (09/09 0800) BP: (109-183)/(69-136) 155/133 (09/09 0800) SpO2:  [91 %-100 %] 98 % (09/09 0800) Weight:  [84.2 kg] 84.2 kg (09/09 0425) Intake/Output      09/08 0701 09/09 0700 09/09 0701 09/10 0700   I.V. (mL/kg) 923.4 (11)    NG/GT 760    Total Intake(mL/kg) 1683.4 (20)    Urine (mL/kg/hr) 300 (0.1)    Drains 161    Stool 540    Total Output 1001    Net +682.4         Urine Occurrence 1 x     Somnolent, eyes open sponataneously Non verbal CN grossly intact Not following commands W/d BUE, w/d BLE EVD in place, patent @ with bloody CSF  LABS: Lab Results  Component Value Date   CREATININE 0.64 04/22/2023   BUN 19 04/22/2023   NA 135 04/22/2023   K 3.8 04/22/2023   CL 104 04/22/2023   CO2 18 (L) 04/22/2023   Lab Results  Component Value Date   WBC 14.9 (H) 04/22/2023   HGB 12.2 04/22/2023   HCT 37.8 04/22/2023   MCV 92.4 04/22/2023   PLT 268 04/22/2023    TCD: Date POD PCO2 HCT BP   MCA ACA PCA OPHT SIPH VERT Basilar  8/31 CK         Right  Left   *  *   *  *   *  31   14  18    *  18   -24  -16   *       9/2 RH         Right  Left   43  28   *  *   *  *   11  10   36  -20   -29  -11   -22       9/4 RH         Right  Left   *  *   *  *   22  *   12  15   32  21   -12  -9   -15      IMAGING: CTH reviewed, demonstrates stable IVH, decreased ventriculomegaly with EVD in place. No acute findings.  IMPRESSION: - 82 y.o. 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   Spoke with PCCM, with tachypnea and hypoxia she may need  intubation for airway/pulmonary support. Family to decide if this is in keeping with her wishes. Given her age and mod/high presenting SAH grade I think her overall prognosis is guarded but she was initially more awake and interactive initially and I think intubation for short-term support to allow more time for neurologic recovery is not unreasonable.  PLAN: - Antipyretics for fever - Cont Nimotop - Permissive HTN up to SBP - Keep EVD open at - TCD today     Lisbeth Renshaw, MD [facility]

## 2023-04-22 NOTE — Progress Notes (Signed)
Sputum sample obtained and sent down to main lab without complications.  

## 2023-04-22 NOTE — Progress Notes (Signed)
Inpatient Rehab Admissions Coordinator:    CIR following at a distance. She is not medically appropriate at this time. Per PT note, pt. With worsening respiratory status and may require intubation. Will continue to follow and pursue if she appears to be an appropriate candidate.   Megan Salon, MS, CCC-SLP Rehab Admissions Coordinator  [phone] (celll) [phone] (office)

## 2023-04-22 NOTE — Progress Notes (Addendum)
NAME:  Wendy Fleming, MRN:  [id number], DOB:  1941-06-13, LOS: 10 ADMISSION DATE:  04/12/2023, CONSULTATION DATE:  04/12/2023 REFERRING MD:  NSGY, CHIEF COMPLAINT:  AMS   History of Present Illness:  82 year old woman who presented to [facility] ED 8/30 after husband found her down minimally responsive.  Vomited multiple times en route to ER.  Intubated for airway protection in ER.  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  NSGY to place EVD and determine best method of securing aneurysm.  PCCM to consult for vent management.  Hunt Hess 3/4 mFS 2 (thin, +IVH)  Pertinent Medical History:  HTN Anemia  Significant Hospital Events: Including procedures, antibiotic start and stop dates in addition to other pertinent events   8/30 EVD placement in ER, admit Coil embolization Left Acomm aneurysm  8/31 Tolerating SBT, able to follow simple commands  9/1 intermittent episodes of agitation overnight, bilateral wrist restraints added, extubated 9/2 TCD remaining neg for vasospasm 9/4 Waxing/waning mental status, at times somnolent. EVD functioning well. 9/5 More somnolent, NSGY notified. EVD functioning. Na 129 (135). Foley placed for strict I&Os while correcting sodium, HTS 3% started. Febrile to 101.60F, PCT negative. LTM EEG per NSGY. 9/6 HTS 3% ongoing, Na corrected to 136.  9/9 fevers/leukocytosis  Interim History / Subjective:  Fevers and increased WBC this morning Foley out yesterday Having some respiratory distress vs Cheyne stokes this morning.  1.5L neg for the admission.   Objective:  Blood pressure (!) 153/96, pulse (!) 105, temperature (!) 100.9 F (38.3 C), temperature source Axillary, resp. rate (!) 39, height 5\' 2"  (1.575 m), weight 84.2 kg, SpO2 100%.        Intake/Output Summary (Last 24 hours) at 04/22/2023 0747 Last data filed at 04/22/2023 0700 Gross per 24 hour  Intake 1683.43 ml  Output 1001 ml  Net 682.43 ml   Filed Weights   04/20/23 0500  04/21/23 0600 04/22/23 0425  Weight: 85.7 kg 86.6 kg 84.2 kg   Physical Exam:  General: Elderly female in NAD HEENT: [place]/AT, PERRL, no JVD. EVD Respiratory: Referred upper airway grunting. No edema.  Cardiovascular: RRR, no MRG GI: Soft, NT, ND. Hyperactive.  Extremities: no acute deformity Neuro: Drowsy but awake, CNII-XII grossly intact. Nodding consistently per RN.   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 CT Head 9/2 shows slight improved ventriculomegaly, cytotoxic edema of anterior left basal ganglia consistent with ischemic infarct. 3% 9/5 - 9/7 - Management per NSGY - EVD in place - Frequent neurochecks - AEDs per NSGY (Keppra for seizure ppx) - LTM EEG without seizure - Continue nimotop, TCDs pending - Seizure/aspiration precautions - Neuroprotective measures: HOB > 30 degrees, normoglycemia, normothermia, electrolytes WNL  - Continue PT/OT/SLP  Acute hypoxemic respiratory failure due to Aspiration pneumonia  Extubated 9/1 CXR stable 9/8. PCT negative. Now 9/9 with fevers and new leukocytosis.  - Continue supplemental O2 support. - Goal sat > 90% - Pulmonary hygiene - Will cover aspiration with Ceftaz/Vanco - Low threshold for intubation, aspiration risk.    Hyponatremia, improving; CSW versus ?SIADH Hypophosphatemia, improved Hypokalemia, improved Osm 282, urine Osm 765. - Replete electrolytes as indicated - Monitor I&Os, Foley placed for strict monitoring - NS @ 50cc for Na goal 135-145. Trend BMET - Consider  Florinef  Dysphagia Evaluated by SLP.  - Continue TF/supplements - SLP following, may required MBSS/FEEs once able to participate in care  Updated family at bedside  Best Practice: (right click and  "Reselect all SmartList Selections" daily)   Diet/type: NPO, tube feeds DVT prophylaxis: SCD, [place] heparin started 9/1 GI prophylaxis: PPI Lines: N/A Foley:  Yes, and it is still needed Code Status:  full code Last date of multidisciplinary goals of care discussion [Per Primary]  Critical care time:     Joneen Roach, AGACNP-BC  Pulmonary & Critical Care  See Amion for personal pager PCCM on call pager [phone] until 7pm. Please call Elink 7p-7a. [phone]  04/22/2023 7:48 AM

## 2023-04-22 NOTE — Procedures (Signed)
Intubation Procedure Note  Wendy Fleming  [id number]  [birth year]  Date:04/22/23  Time:11:16 AM   Provider Performing:Simrat Wilford Corner, MD student and Philomena Buttermore Mechele Collin, MD   Procedure: Intubation (31500)  Indication(s) Respiratory Failure  Consent Risks of the procedure as well as the alternatives and risks of each were explained to the patient and/or caregiver.  Consent for the procedure was obtained and is signed in the bedside chart   Anesthesia Etomidate, Versed, and Fentanyl   Time Out Verified patient identification, verified procedure, site/side was marked, verified correct patient position, special equipment/implants available, medications/allergies/relevant history reviewed, required imaging and test results available.   Sterile Technique Usual hand hygeine, masks, and gloves were used   Procedure Description Patient positioned in bed supine.  Sedation given as noted above.  Patient was intubated with endotracheal tube using Glidescope.  View was Grade 1 full glottis .  Number of attempts was 1.  Colorimetric CO2 detector was consistent with tracheal placement.   Complications/Tolerance None; patient tolerated the procedure well. Chest X-ray is ordered to verify placement.   EBL Minimal   Specimen(s) None

## 2023-04-22 NOTE — Progress Notes (Signed)
OT Cancellation Note  Patient Details Name: Wendy Fleming MRN: [id number] DOB: 09-29-1940   Cancelled Treatment:    Reason Eval/Treat Not Completed: Medical issues which prohibited therapy- spoke to RN who reports worsening respiratory status and possible intubation.  Will hold OT.    Barry Brunner, OT Acute Rehabilitation Services Office [phone]   Chancy Milroy 04/22/2023, 9:57 AM

## 2023-04-22 NOTE — Progress Notes (Signed)
Transcranial Doppler  Date POD PCO2 HCT BP  MCA ACA PCA OPHT SIPH VERT Basilar  9/9 SW     Right  Left   26  27   *  *   *  26   26  22   30  30    *  *   *           Right  Left                                            Right  Left                                             Right  Left                                             Right  Left                                            Right  Left                                            Right  Left                                        MCA = Middle Cerebral Artery      OPHT = Opthalmic Artery     BASILAR = Basilar Artery   ACA = Anterior Cerebral Artery     SIPH = Carotid Siphon PCA = Posterior Cerebral Artery   VERT = Verterbral Artery                   Normal MCA = 62+\-12 ACA = 50+\-12 PCA = 42+\-23

## 2023-04-22 NOTE — Progress Notes (Signed)
Pharmacy Antibiotic Note  Wendy Fleming is a 82 y.o. female admitted on 04/12/2023 with  aneurysmal SAH .  Pharmacy has been consulted for meropenem and vancomycin dosing for treatment of aspiration pneumonia with fevers and new leukocytosis. Patient is also now intubated and on day 11 of hospitalization. History of penicillin allergy and avoiding cefepime due to concern for neurotoxicity.  Plan: Initiate meropenem 2g q8h Vancomycin 1.5 g loading dose x1 Will schedule regimen pending MRSA PCR (1500 q24h has estimated AUC 490 and trough 11.3) - t1/2 13.7, Ke 0.05  Height: 5\' 2"  (157.5 cm) Weight: 84.2 kg (185 lb 10 oz) IBW/kg (Calculated) : 50.1  Temp (24hrs), Avg:99.8 F (37.7 C), Min:98.1 F (36.7 C), Max:101.6 F (38.7 C)  Recent Labs  Lab 04/18/23 0506 04/18/23 1339 04/19/23 0516 04/19/23 1127 04/19/23 2154 04/20/23 0503 04/20/23 1042 04/21/23 0601 04/22/23 0433  WBC 10.3  --  11.2*  --   --  11.9*  --  14.3* 14.9*  CREATININE 0.64   < > 0.60   < > 0.57 0.62 0.56 0.61 0.64   < > = values in this interval not displayed.    Estimated Creatinine Clearance: 56.4 mL/min (by C-G formula based on SCr of 0.64 mg/dL).    Allergies  Allergen Reactions   Erythromycin Swelling    Other Reaction(s): rash/swelling   Penicillins Hives    Other Reaction(s): rash/swelling   Streptomycin Swelling    Other Reaction(s): rash/swelling   Sulfa Antibiotics Nausea And Vomiting   Tetracycline Hcl     Other Reaction(s): rash/swelling   Tetracyclines & Related Swelling    Antimicrobials this admission: Rocephin 8/30>9/3 Meropenem 9/9 > Vanc 9/9 >  Microbiology results: 9/9 Trach aspirate:   9/9 MRSA PCR:   Thank you for allowing pharmacy to be a part of this patient's care.  Rutherford Nail, PharmD PGY2 Critical Care Pharmacy Resident 04/22/2023 4:12 PM

## 2023-04-23 DIAGNOSIS — J9601 Acute respiratory failure with hypoxia: Secondary | ICD-10-CM

## 2023-04-23 DIAGNOSIS — J690 Pneumonitis due to inhalation of food and vomit: Secondary | ICD-10-CM

## 2023-04-23 DIAGNOSIS — I619 Nontraumatic intracerebral hemorrhage, unspecified: Secondary | ICD-10-CM | POA: Diagnosis not present

## 2023-04-23 LAB — POCT I-STAT 7, (LYTES, BLD GAS, ICA,H+H)
Acid-base deficit: 4 mmol/L — ABNORMAL HIGH (ref 0.0–2.0)
Bicarbonate: 19.7 mmol/L — ABNORMAL LOW (ref 20.0–28.0)
Calcium, Ion: 1.28 mmol/L (ref 1.15–1.40)
HCT: 32 % — ABNORMAL LOW (ref 36.0–46.0)
Hemoglobin: 10.9 g/dL — ABNORMAL LOW (ref 12.0–15.0)
O2 Saturation: 99 %
Patient temperature: 100
Potassium: 4 mmol/L (ref 3.5–5.1)
Sodium: 135 mmol/L (ref 135–145)
TCO2: 21 mmol/L — ABNORMAL LOW (ref 22–32)
pCO2 arterial: 33.2 mmHg (ref 32–48)
pH, Arterial: 7.384 (ref 7.35–7.45)
pO2, Arterial: 152 mmHg — ABNORMAL HIGH (ref 83–108)

## 2023-04-23 LAB — BASIC METABOLIC PANEL
Anion gap: 7 (ref 5–15)
BUN: 22 mg/dL (ref 8–23)
CO2: 21 mmol/L — ABNORMAL LOW (ref 22–32)
Calcium: 8.8 mg/dL — ABNORMAL LOW (ref 8.9–10.3)
Chloride: 106 mmol/L (ref 98–111)
Creatinine, Ser: 0.6 mg/dL (ref 0.44–1.00)
GFR, Estimated: >60 mL/min (ref >60)
Glucose, Bld: 140 mg/dL — ABNORMAL HIGH (ref 70–99)
Potassium: 3.7 mmol/L (ref 3.5–5.1)
Sodium: 134 mmol/L — ABNORMAL LOW (ref 135–145)

## 2023-04-23 LAB — MAGNESIUM: Magnesium: 2 mg/dL (ref 1.7–2.4)

## 2023-04-23 LAB — GLUCOSE, CAPILLARY
Glucose-Capillary: 122 mg/dL — ABNORMAL HIGH (ref 70–99)
Glucose-Capillary: 127 mg/dL — ABNORMAL HIGH (ref 70–99)
Glucose-Capillary: 141 mg/dL — ABNORMAL HIGH (ref 70–99)
Glucose-Capillary: 142 mg/dL — ABNORMAL HIGH (ref 70–99)
Glucose-Capillary: 144 mg/dL — ABNORMAL HIGH (ref 70–99)
Glucose-Capillary: 147 mg/dL — ABNORMAL HIGH (ref 70–99)

## 2023-04-23 LAB — CBC
HCT: 32.5 % — ABNORMAL LOW (ref 36.0–46.0)
Hemoglobin: 10 g/dL — ABNORMAL LOW (ref 12.0–15.0)
MCH: 28.7 pg (ref 26.0–34.0)
MCHC: 30.8 g/dL (ref 30.0–36.0)
MCV: 93.1 fL (ref 80.0–100.0)
Platelets: 212 10*3/uL (ref 150–400)
RBC: 3.49 MIL/uL — ABNORMAL LOW (ref 3.87–5.11)
RDW: 14.6 % (ref 11.5–15.5)
WBC: 11.3 10*3/uL — ABNORMAL HIGH (ref 4.0–10.5)
nRBC: 0 % (ref 0.0–0.2)

## 2023-04-23 LAB — PHOSPHORUS: Phosphorus: 3.4 mg/dL (ref 2.5–4.6)

## 2023-04-23 MED ORDER — PIPERACILLIN-TAZOBACTAM 3.375 G IVPB
3.3750 g | Freq: Three times a day (TID) | INTRAVENOUS | Status: DC
  Administered 2023-04-23 – 2023-04-24 (×4): 3.375 g via INTRAVENOUS
  Filled 2023-04-23 (×4): qty 50

## 2023-04-23 MED ORDER — CHLORHEXIDINE GLUCONATE CLOTH 2 % EX PADS
6.0000 | MEDICATED_PAD | Freq: Every day | CUTANEOUS | Status: DC
  Administered 2023-04-23 – 2023-05-02 (×11): 6 via TOPICAL

## 2023-04-23 MED ORDER — BANATROL TF EN LIQD
60.0000 mL | Freq: Two times a day (BID) | ENTERAL | Status: DC
  Administered 2023-04-23 – 2023-04-30 (×15): 60 mL
  Filled 2023-04-23 (×15): qty 60

## 2023-04-23 MED ORDER — FENTANYL CITRATE PF 50 MCG/ML IJ SOSY: 25.0000 ug | PREFILLED_SYRINGE | Freq: Once | INTRAMUSCULAR | Status: AC

## 2023-04-23 MED ORDER — FENTANYL BOLUS VIA INFUSION
25.0000 ug | INTRAVENOUS | Status: DC | PRN
  Administered 2023-04-24: 25 ug via INTRAVENOUS
  Administered 2023-04-25: 50 ug via INTRAVENOUS
  Administered 2023-04-26 (×2): 25 ug via INTRAVENOUS
  Administered 2023-04-27: 75 ug via INTRAVENOUS
  Administered 2023-04-28: 100 ug via INTRAVENOUS
  Administered 2023-04-28: 50 ug via INTRAVENOUS

## 2023-04-23 MED ORDER — FENTANYL 2500MCG IN NS 250ML (10MCG/ML) PREMIX INFUSION
25.0000 ug/h | INTRAVENOUS | Status: DC
  Administered 2023-04-23 – 2023-04-28 (×5): 25 ug/h via INTRAVENOUS
  Filled 2023-04-23 (×3): qty 250

## 2023-04-23 NOTE — Progress Notes (Signed)
SLP Cancellation Note  Patient Details Name: CHINWE BERGHOFF MRN: [id number] DOB: [birth year]   Cancelled treatment:       Reason Eval/Treat Not Completed: Medical issues which prohibited therapy (on vent). Will f/u as able.    Mahala Menghini., M.A. CCC-SLP Acute Rehabilitation Services Office [phone]  Secure chat preferred  04/23/2023, 9:20 AM

## 2023-04-23 NOTE — Progress Notes (Signed)
OT Cancellation Note  Patient Details Name: Wendy Fleming MRN: [id number] DOB: 12-12-1940   Cancelled Treatment:    Reason Eval/Treat Not Completed: Medical issues which prohibited therapy (Pt on vent and sedated. Will follow up at later date/time as pt medically ready and schedule allows.)   Barry Brunner, OT Acute Rehabilitation Services Office [phone]   Chancy Milroy 04/23/2023, 10:46 AM

## 2023-04-23 NOTE — Progress Notes (Signed)
  NEUROSURGERY PROGRESS NOTE   Pt seen and examined. No issues overnight, required ET intubation yesterday.  EXAM: Temp:  [98.1 F (36.7 C)-100 F (37.8 C)] 98.5 F (36.9 C) (09/10 1200) Pulse Rate:  [60-88] 65 (09/10 0645) Resp:  [23-35] 30 (09/10 0645) BP: (82-142)/(56-114) 121/80 (09/10 1148) SpO2:  [96 %-100 %] 99 % (09/10 0645) FiO2 (%):  [35 %-40 %] 35 % (09/10 1148) Weight:  [87.2 kg] 87.2 kg (09/10 0445) Intake/Output      09/09 0701 09/10 0700 09/10 0701 09/11 0700   I.V. (mL/kg) 1260.7 (14.5)    NG/GT 560    IV Piggyback 1650.4    Total Intake(mL/kg) 3471.1 (39.8)    Urine (mL/kg/hr) 1300 (0.6)    Drains 132    Stool 415    Total Output 1847    Net +1624.1          On low-dose fentanyl Eyes open spontaneously Breathing over vent CN grossly intact Not following commands W/d  weakly BUE, BLE EVD in place, patent @ with bloody CSF  LABS: Lab Results  Component Value Date   CREATININE 0.60 04/23/2023   BUN 22 04/23/2023   NA 134 (L) 04/23/2023   K 3.7 04/23/2023   CL 106 04/23/2023   CO2 21 (L) 04/23/2023   Lab Results  Component Value Date   WBC 11.3 (H) 04/23/2023   HGB 10.0 (L) 04/23/2023   HCT 32.5 (L) 04/23/2023   MCV 93.1 04/23/2023   PLT 212 04/23/2023    TCD: Date POD PCO2 HCT BP   MCA ACA PCA OPHT SIPH VERT Basilar  8/31 CK         Right  Left   *  *   *  *   *  31   14  18    *  18   -24  -16   *       9/2 RH         Right  Left   43  28   *  *   *  *   11  10   36  -20   -29  -11   -22       9/4 RH         Right  Left   *  *   *  *   22  *   12  15   32  21   -12  -9   -15     9/9 SW         Right  Left   26  27   *  *   *  26   26  22   30  30    *  *   *         IMAGING: CTH reviewed, unchanged IVH, no worsening ventriculomegaly  IMPRESSION: - 82 y.o. 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

## 2023-04-23 NOTE — Progress Notes (Addendum)
NAME:  Wendy Fleming, MRN:  [id number], DOB:  August 09, 1941, LOS: 11 ADMISSION DATE:  04/12/2023, CONSULTATION DATE:  04/12/2023 REFERRING MD:  NSGY, CHIEF COMPLAINT:  AMS   History of Present Illness:  82 year old woman who presented to [facility] ED 8/30 after husband found her down minimally responsive.  Vomited multiple times en route to ER.  Intubated for airway protection in ER.  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  NSGY to place EVD and determine best method of securing aneurysm.  PCCM to consult for vent management.  Hunt Hess 3/4 mFS 2 (thin, +IVH)  Pertinent Medical History:  HTN Anemia  Significant Hospital Events: Including procedures, antibiotic start and stop dates in addition to other pertinent events   8/30 EVD placement in ER, admit Coil embolization Left Acomm aneurysm  8/31 Tolerating SBT, able to follow simple commands  9/1 intermittent episodes of agitation overnight, bilateral wrist restraints added, extubated 9/2 TCD remaining neg for vasospasm 9/4 Waxing/waning mental status, at times somnolent. EVD functioning well. 9/5 More somnolent, NSGY notified. EVD functioning. Na 129 (135). Foley placed for strict I&Os while correcting sodium, HTS 3% started. Febrile to 101.4F, PCT negative. LTM EEG per NSGY. 9/6 HTS 3% ongoing, Na corrected to 136.  9/9 fevers/leukocytosis > intubated for PNA, airway protection   Interim History / Subjective:  Intubated yesterday. Tolerating well. Briefly on pressors overnight. Responded to fluids.    Objective:  Blood pressure 117/77, pulse 65, temperature 99.2 F (37.3 C), temperature source Axillary, resp. rate (!) 30, height 5\' 2"  (1.575 m), weight 87.2 kg, SpO2 99%.    Vent Mode: PRVC FiO2 (%):  [35 %-100 %] 35 % Set Rate:  [14 bmp-18 bmp] 14 bmp Vt Set:  [400 mL] 400 mL PEEP:  [5 cmH20] 5 cmH20 Plateau Pressure:  [15 cmH20] 15 cmH20   Intake/Output Summary (Last 24 hours) at 04/23/2023 0810 Last data  filed at 04/23/2023 0700 Gross per 24 hour  Intake 3471.13 ml  Output 1800 ml  Net 1671.13 ml   Filed Weights   04/21/23 0600 04/22/23 0425 04/23/23 0445  Weight: 86.6 kg 84.2 kg 87.2 kg   Physical Exam:  General: Elderly female on the vent HEENT: [place]/AT, PERRL, no JVD Respiratory: Bibasilar rhonchi  Cardiovascular: RRR, no MRG GI: Soft, NT, ND Extremities: No acute deformity. No edema.  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 CT Head 9/2 shows slight improved ventriculomegaly, cytotoxic edema of anterior left basal ganglia consistent with ischemic infarct. 3% 9/5 - 9/7 - Management per NSGY - EVD in place - Frequent neurochecks - AEDs per NSGY (Keppra for seizure ppx) - LTM EEG without seizure, continue - Continue nimotop, TCDs  - Resume PT/OT/SLP when extubated  Acute hypoxemic respiratory failure due to Aspiration pneumonia  Extubated 9/1 CXR stable 9/8. PCT negative. 9/9 with fevers and new leukocytosis > presumably recurrent aspiration vs HCAP - Full vent support - Sputum culture GPC, GPR > pending - Merrem/Vanco continue - No SBT today, will try tomorrow.  - Discussed with family, will try to optimize over the next couple of days prior to an extubation attempt. If extubated with ongoing airway concerns may make sense for one way extubation.  Hyponatremia, improving; CSW versus ?SIADH Hypophosphatemia, improved Hypokalemia, improved Osm 282, urine Osm 765. - Replete electrolytes as indicated - Monitor I&Os, External urine collection system - NS @ 50cc for Na goal 135-145. Trend BMET - Consider Florinef  Dysphagia Evaluated by SLP.  - Continue TF/supplements - Will need SLP  once extubated  Updated family at bedside  Best Practice: (right click and "Reselect all SmartList Selections" daily)   Diet/type: NPO, tube feeds DVT prophylaxis: SCD, [place] heparin started 9/1 GI prophylaxis: PPI Lines: N/A EVD Foley:  N/A Code Status:  full code Last date of multidisciplinary goals of care discussion [Per Primary]  Critical care time: 43 minutes    Joneen Roach, AGACNP-BC [place] [facility]  See Amion for personal pager PCCM on call pager [phone] until 7pm. Please call Elink 7p-7a. [phone]  04/23/2023 8:10 AM   Patient seen and examined, note reviewed with the signed Advanced Practice Provider. I personally reviewed laboratory data, imaging studies and relevant notes. I independently examined the patient and formulated the important aspects of the plan. Comments or changes to the note/plan are indicated below.   Briefly, 82 year old female admitted for [facility] s/p coil embolization for Acom aneurysm and EVD for hydrocephalus. NSGY primary. PCCM following for airway protection   Intubated yesterday. No further fevers  Blood pressure 121/80, pulse 65, temperature 98.5 F (36.9 C), temperature source Axillary, resp. rate (!) 30, height 5\' 2"  (1.575 m), weight 87.2 kg, SpO2 99%.  Physical Exam: General: Chronically ill-appearing, no acute distress HENT: [place], AT, ETT in place Eyes: EOMI, no scleral icterus Respiratory: Bilateral rhonchi at the bases Cardiovascular: RRR, -M/R/G, no JVD GI: BS+, soft, nontender Extremities:-Edema,-tenderness Neuro: Sedated  WBC improved to ~11  SAH 2/2 ruptured Acomm aneurysm with IVH and hydrocephalus s/p embolization and EVD 8/30 Acute respiratory failure 2/2 above +/- aspiration Sepsis without organ dysfunction Post-op management and EVD per primary NSGY team Nimotop Neurochecks, seizure precautions Full vent support LTVV, 4-8cc/kg IBW with goal Pplat<30 and DP<15 Transitioned to  Zosyn Continue NS @75cc  for goal 135-145   The patient is critically ill with multiple organ systems failure and requires high complexity decision making for assessment and support, frequent evaluation and titration of therapies, application of advanced monitoring technologies and extensive interpretation of multiple databases.  Independent Critical Care Time: 35 Minutes.   Mechele Collin, M.D. [facility] Pulmonary/Critical Care Medicine 04/23/2023 1:02 PM   Please see Amion for pager number to reach on-call Pulmonary and Critical Care Team.

## 2023-04-23 NOTE — Progress Notes (Signed)
Nutrition Follow-up  DOCUMENTATION CODES:   Obesity unspecified  INTERVENTION:   Tube feeding via cortrak tube: Osmolite 1.5 @ 40 ml/h (960 ml per day) Prosource TF20 60 ml daily  Provides 1520 kcal, 80 gm protein, 729 ml free water daily  MVI daily  Add Banatrol BID  NUTRITION DIAGNOSIS:   Inadequate oral intake related to acute illness as evidenced by NPO status. Ongoing.   GOAL:   Patient will meet greater than or equal to 90% of their needs Met with TF at goal   MONITOR:   Diet advancement, Vent status, Labs, Weight trends, TF tolerance  REASON FOR ASSESSMENT:   Consult Enteral/tube feeding initiation and management  ASSESSMENT:   Pt admitted after being found minimally responsive d/t ruptured Acom with developing hydrocephalus. No significant PMH listed.  Pt discussed during ICU rounds and with RN. Pt re-intubated 9/9 for PNA/airway protection. Remains on LTM EEG. Per MD plan to optimize on vent next couple of days for possible extubation Weight stable Spoke with granddaughter who  is at bedside.   8/30 - s/p coil embolization, EVD placement 9/1 - extubated, NG tube placed 9/6 - s/p cortrak placement; tip at pylorus or duodenal bulb  9/9 - re-intubated for PNA  Medications reviewed and include: colace, keppra, MVI with minerals, nimotop, protonix, miralax  NS @ 75 ml/hr Precedex   Labs reviewed:  Na 134 CBG's: 109-159  ICP: 132 ml    Diet Order:   Diet Order             Diet NPO time specified  Diet effective now                   EDUCATION NEEDS:   No education needs have been identified at this time  Skin:  Skin Assessment: Reviewed RN Assessment  Last BM:  375 ml documented via FMS  Height:   Ht Readings from Last 1 Encounters:  04/12/23 5\' 2"  (1.575 m)    Weight:   Wt Readings from Last 1 Encounters:  04/23/23 87.2 kg    Ideal Body Weight:  50 kg  BMI:  Body mass index is 35.16 kg/m.  Estimated Nutritional  Needs:   Kcal:  1300-1500  Protein:  65-80g  Fluid:  >/=1.5L  Yolander Goodie P., RD, LDN, CNSC See AMiON for contact information

## 2023-04-23 NOTE — Progress Notes (Signed)
Inpatient Rehab Admissions Coordinator:    CIR following, Pt. On vent, not appropriate for CIR at this time. I will sign off but if Pt. Is appears appropriate once off vent, therapy or MD may reconsult.   Megan Salon, MS, CCC-SLP Rehab Admissions Coordinator  [phone] (celll) [phone] (office)

## 2023-04-23 NOTE — Progress Notes (Signed)
PT Cancellation Note  Patient Details Name: Wendy Fleming MRN: [id number] DOB: 05/27/1941   Cancelled Treatment:    Reason Eval/Treat Not Completed: Medical issues which prohibited therapy (Pt on vent and sedated. Will follow up at later date/time as pt medically ready and schedule allows.)   Wynn Maudlin, DPT Acute Rehabilitation Services Office [phone]  04/23/23 10:27 AM

## 2023-04-24 ENCOUNTER — Inpatient Hospital Stay (HOSPITAL_COMMUNITY): Payer: Medicare HMO

## 2023-04-24 DIAGNOSIS — I609 Nontraumatic subarachnoid hemorrhage, unspecified: Secondary | ICD-10-CM | POA: Diagnosis not present

## 2023-04-24 DIAGNOSIS — J9601 Acute respiratory failure with hypoxia: Secondary | ICD-10-CM | POA: Diagnosis not present

## 2023-04-24 DIAGNOSIS — I619 Nontraumatic intracerebral hemorrhage, unspecified: Secondary | ICD-10-CM | POA: Diagnosis not present

## 2023-04-24 LAB — GLUCOSE, CAPILLARY
Glucose-Capillary: 125 mg/dL — ABNORMAL HIGH (ref 70–99)
Glucose-Capillary: 131 mg/dL — ABNORMAL HIGH (ref 70–99)
Glucose-Capillary: 137 mg/dL — ABNORMAL HIGH (ref 70–99)
Glucose-Capillary: 139 mg/dL — ABNORMAL HIGH (ref 70–99)
Glucose-Capillary: 149 mg/dL — ABNORMAL HIGH (ref 70–99)
Glucose-Capillary: 153 mg/dL — ABNORMAL HIGH (ref 70–99)

## 2023-04-24 LAB — CBC
HCT: 31.6 % — ABNORMAL LOW (ref 36.0–46.0)
Hemoglobin: 9.8 g/dL — ABNORMAL LOW (ref 12.0–15.0)
MCH: 28.8 pg (ref 26.0–34.0)
MCHC: 31 g/dL (ref 30.0–36.0)
MCV: 92.9 fL (ref 80.0–100.0)
Platelets: 210 10*3/uL (ref 150–400)
RBC: 3.4 MIL/uL — ABNORMAL LOW (ref 3.87–5.11)
RDW: 14.3 % (ref 11.5–15.5)
WBC: 9.6 10*3/uL (ref 4.0–10.5)
nRBC: 0 % (ref 0.0–0.2)

## 2023-04-24 LAB — BASIC METABOLIC PANEL
Anion gap: 9 (ref 5–15)
BUN: 19 mg/dL (ref 8–23)
CO2: 20 mmol/L — ABNORMAL LOW (ref 22–32)
Calcium: 8.7 mg/dL — ABNORMAL LOW (ref 8.9–10.3)
Chloride: 105 mmol/L (ref 98–111)
Creatinine, Ser: 0.6 mg/dL (ref 0.44–1.00)
GFR, Estimated: >60 mL/min (ref >60)
Glucose, Bld: 148 mg/dL — ABNORMAL HIGH (ref 70–99)
Potassium: 3.8 mmol/L (ref 3.5–5.1)
Sodium: 134 mmol/L — ABNORMAL LOW (ref 135–145)

## 2023-04-24 LAB — PHOSPHORUS: Phosphorus: 3 mg/dL (ref 2.5–4.6)

## 2023-04-24 LAB — CULTURE, RESPIRATORY W GRAM STAIN: Culture: NORMAL

## 2023-04-24 LAB — SODIUM
Sodium: 135 mmol/L (ref 135–145)
Sodium: 137 mmol/L (ref 135–145)

## 2023-04-24 LAB — MAGNESIUM: Magnesium: 2 mg/dL (ref 1.7–2.4)

## 2023-04-24 MED ORDER — BETHANECHOL CHLORIDE 10 MG PO TABS
10.0000 mg | ORAL_TABLET | Freq: Three times a day (TID) | ORAL | Status: AC
  Administered 2023-04-24 – 2023-04-30 (×18): 10 mg
  Filled 2023-04-24 (×20): qty 1

## 2023-04-24 MED ORDER — SODIUM CHLORIDE 3 % IV SOLN
INTRAVENOUS | Status: DC
  Filled 2023-04-24 (×2): qty 500

## 2023-04-24 MED ORDER — FUROSEMIDE 10 MG/ML IJ SOLN
40.0000 mg | Freq: Once | INTRAMUSCULAR | Status: AC
  Administered 2023-04-24: 40 mg via INTRAVENOUS
  Filled 2023-04-24: qty 4

## 2023-04-24 MED ORDER — BETHANECHOL CHLORIDE 10 MG PO TABS: 10.0000 mg | ORAL_TABLET | Freq: Three times a day (TID) | ORAL | Status: DC

## 2023-04-24 NOTE — Progress Notes (Signed)
Occupational Therapy Treatment Patient Details Name: Wendy Fleming MRN: [id number] DOB: Aug 30, 1940 Today's Date: 04/24/2023   History of present illness Pt is an 82 y.o. female who presented 04/12/23 after being found minimally responsive. 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 ETT 8/30 - 9/1. Re-intubated 9/9. PMH: anemia, arthritis, HTN   OT comments  Pt participated in limited session with OT/PT at bed level (ICU bed put in chair position) Pt weaning and on sedation- but she has been responding to family and following some commands. Pt more lethargic and responded to deep nail bed noxious stimulus opening eyes and tracking to left x2 - but not past midline. And despite eyes open not following commands. RUE with noted "tremor-like" movements and spontaneous squeezes but not to command. No withdrawal to noxious stimuli on LLE. VSS throughout session on vent. Family present and very supportive. 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 Big supportive family. OT will continue to follow acutely for functional changes in conjunction with medical stability.       If plan is discharge home, recommend the following:  Assist for transportation;Assistance with cooking/housework;Two people to help with walking and/or transfers;A lot of help with bathing/dressing/bathroom;Direct supervision/assist for medications management;Direct supervision/assist for financial management   Equipment Recommendations  None recommended by OT    Recommendations for Other Services      Precautions / Restrictions Precautions Precautions: Fall;Other (comment) Precaution Comments: EVD (clamp for sessions); flexiseal, foley, bil mittens Restrictions Weight Bearing Restrictions: No       Mobility Bed Mobility Overal bed mobility: Needs Assistance Bed Mobility: Supine to Sit,  Rolling Rolling: Total assist, +2 for physical assistance, +2 for safety/equipment   Supine to sit: Total assist, +2 for physical assistance, +2 for safety/equipment, HOB elevated     General bed mobility comments: Total assist to lift her trunk off the bed surface with bed in chair position and to roll to L for pressure relief purposes end of session    Transfers                   General transfer comment: pt unable due to lethargy/fatigue today     Balance Overall balance assessment: Needs assistance Sitting-balance support: No upper extremity supported, Feet unsupported Sitting balance-Leahy Scale: Zero Sitting balance - Comments: Total assist to lift her trunk off elevated HOB with bed in chair position       Standing balance comment: deferred                           ADL either performed or assessed with clinical judgement   ADL Overall ADL's : Needs assistance/impaired                                       General ADL Comments: total A at bed level    Extremity/Trunk Assessment Upper Extremity Assessment Upper Extremity Assessment: RUE deficits/detail RUE Deficits / Details: "tremor" movement and some spontaneous squeezing when fingers placed in palm of hand. made faces to deep nail bed noxious stimuli            Vision   Vision Assessment?: Vision impaired- to be further tested in functional context Additional Comments: tracked x2 to left, did not blink to threat when eyelids lifted  Perception     Praxis      Cognition Arousal: Lethargic, Obtunded Behavior During Therapy: Flat affect Overall Cognitive Status: Impaired/Different from baseline Area of Impairment: Attention, Following commands, Safety/judgement, Awareness, Problem solving                   Current Attention Level: Focused   Following Commands: Follows one step commands inconsistently Safety/Judgement: Decreased awareness of safety, Decreased  awareness of deficits Awareness: Intellectual Problem Solving: Decreased initiation, Slow processing, Difficulty sequencing, Requires verbal cues, Requires tactile cues General Comments: RN reporting pt still mildly sedated but has moments of her being awake, thus attempted session. Pt moves L extremities spontaneously more than her R. Did not follow cues to actively move extremities, nod, or blink eyes. She did track therapist to her L 2x, but could not track past midline to the R. No response to deep noxious pressure at R foot, but strong withdrawal on L noted.        Exercises Exercises: Low Level/ICU General Exercises - Lower Extremity Ankle Circles/Pumps: PROM, Both, 10 reps, Supine Quad Sets: PROM, Both, 10 reps, Supine Heel Slides: PROM, Both, 10 reps, Supine Hip ABduction/ADduction: PROM, Both, 10 reps, Supine Low Level/ICU Exercises Shoulder Flexion: PROM, Both, 10 reps, Supine (chair positoin in bed, as allowed by lines) Elbow Flexion: PROM, Both, 10 reps, Seated (bed in chair position)    Shoulder Instructions       General Comments VSS on vent, 35% FiO2 PEEP 5; EVD clamped throughout session, notified RN of end of session to unclamp it; daughter and granddaughter present and supportive throughout session    Pertinent Vitals/ Pain       Pain Assessment Pain Assessment: Faces Faces Pain Scale: Hurts even more Pain Location: noxious stimuli to eyelids and particularly to L foot Pain Descriptors / Indicators: Grimacing, Guarding Pain Intervention(s): Limited activity within patient's tolerance, Monitored during session, Repositioned  Home Living                                          Prior Functioning/Environment              Frequency  Min 1X/week        Progress Toward Goals  OT Goals(current goals can now be found in the care plan section)  Progress towards OT goals: Not progressing toward goals - comment (not following commands at  this time)  Acute Rehab OT Goals OT Goal Formulation: Patient unable to participate in goal setting Time For Goal Achievement: 04/29/23 Potential to Achieve Goals: Fair  Plan      Co-evaluation    PT/OT/SLP Co-Evaluation/Treatment: Yes Reason for Co-Treatment: Complexity of the patient's impairments (multi-system involvement);Necessary to address cognition/behavior during functional activity;For patient/therapist safety PT goals addressed during session: Mobility/safety with mobility;Strengthening/ROM OT goals addressed during session: Strengthening/ROM      AM-PAC OT "6 Clicks" Daily Activity     Outcome Measure   Help from another person eating meals?: Total Help from another person taking care of personal grooming?: Total Help from another person toileting, which includes using toliet, bedpan, or urinal?: Total Help from another person bathing (including washing, rinsing, drying)?: Total Help from another person to put on and taking off regular upper body clothing?: Total Help from another person to put on and taking off regular lower body clothing?: Total 6 Click Score: 6  End of Session Equipment Utilized During Treatment: Oxygen (35% FiO2 PEEP 5)  OT Visit Diagnosis: Muscle weakness (generalized) (M62.81);Hemiplegia and hemiparesis;Other symptoms and signs involving cognitive function;Other symptoms and signs involving the nervous system (R29.898);Other abnormalities of gait and mobility (R26.89) Hemiplegia - Right/Left: Right Hemiplegia - dominant/non-dominant: Dominant Hemiplegia - caused by: Nontraumatic intracerebral hemorrhage   Activity Tolerance Patient limited by lethargy;Patient limited by fatigue   Patient Left in bed;with call bell/phone within reach;with bed alarm set;with nursing/sitter in room   Nurse Communication Mobility status;Need for lift equipment        Time: 1610-9604 OT Time Calculation (min): 27 min  Charges: OT General Charges $OT  Visit: 1 Visit OT Treatments $Therapeutic Activity: 8-22 mins  Nyoka Cowden OTR/L Acute Rehabilitation Services Office: [phone]  Evern [facility] 04/24/2023, 1:48 PM

## 2023-04-24 NOTE — Progress Notes (Signed)
NEUROSURGERY PROGRESS NOTE   Pt seen and examined. No issues overnight, weaning on vent currently.  EXAM: Temp:  [98.3 F (36.8 C)-98.8 F (37.1 C)] 98.3 F (36.8 C) (09/11 1200) Pulse Rate:  [55-71] 63 (09/11 0815) Resp:  [23-34] 25 (09/11 0815) BP: (108-147)/(65-97) 147/81 (09/11 0815) SpO2:  [98 %-100 %] 100 % (09/11 0815) FiO2 (%):  [35 %] 35 % (09/11 0815) Weight:  [88 kg] 88 kg (09/11 0500) Intake/Output      09/10 0701 09/11 0700 09/11 0701 09/12 0700   I.V. (mL/kg) 1668.8 (19) 30 (0.3)   NG/GT 960    IV Piggyback 113.9    Total Intake(mL/kg) 2742.7 (31.2) 30 (0.3)   Urine (mL/kg/hr) 850 (0.4)    Drains 142 14   Stool 375    Total Output 1367 14   Net +1375.7 +16        Urine Occurrence 0 x     On low-dose fentanyl Eyes open to voice Breathing over vent CN grossly intact Not following commands W/d  weakly BUE, BLE. Occssionally moves BUE/BLE purporsefully EVD in place, patent @ with bloody CSF  LABS: Lab Results  Component Value Date   CREATININE 0.60 04/24/2023   BUN 19 04/24/2023   NA 134 (L) 04/24/2023   K 3.8 04/24/2023   CL 105 04/24/2023   CO2 20 (L) 04/24/2023   Lab Results  Component Value Date   WBC 9.6 04/24/2023   HGB 9.8 (L) 04/24/2023   HCT 31.6 (L) 04/24/2023   MCV 92.9 04/24/2023   PLT 210 04/24/2023    TCD: Date POD PCO2 HCT BP   MCA ACA PCA OPHT SIPH VERT Basilar  8/31 CK         Right  Left   *  *   *  *   *  31   14  18    *  18   -24  -16   *       9/2 RH         Right  Left   43  28   *  *   *  *   11  10   36  -20   -29  -11   -22       9/4 RH         Right  Left   *  *   *  *   22  *   12  15   32  21   -12  -9   -15     9/9 SW         Right  Left   26  27   *  *   *  26   26  22   30  30    *  *   *         IMPRESSION: - 82 y.o. female SAH d# 13 s/p coil embolization Acom aneurysm and EVD for associated HCP, remains neurologically stable,  minimally responsive  On abx for possible aspiration pna  PLAN: - Antipyretics for fever - Cont Nimotop - Permissive HTN up to SBP - Keep EVD open at for now - TCD today   Reviewed situation with family.   I suggested we continue with supportive care over the next several days.  I did discuss with them the possibility of minimal improvement by this time next week in which case we may need to make  a decision about long-term care versus changing goals of care.  All their questions today were answered.  Lisbeth Renshaw, MD [facility]

## 2023-04-24 NOTE — Progress Notes (Signed)
NAME:  Wendy Fleming, MRN:  [id number], DOB:  08-29-1940, LOS: 12 ADMISSION DATE:  04/12/2023, CONSULTATION DATE:  04/12/2023 REFERRING MD:  NSGY, CHIEF COMPLAINT:  AMS   History of Present Illness:  82 year old woman who presented to [facility] ED 8/30 after husband found her down minimally responsive.  Vomited multiple times en route to ER.  Intubated for airway protection in ER.  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  NSGY to place EVD and determine best method of securing aneurysm.  PCCM to consult for vent management.  Hunt Hess 3/4 mFS 2 (thin, +IVH)  Pertinent Medical History:  HTN Anemia  Significant Hospital Events: Including procedures, antibiotic start and stop dates in addition to other pertinent events   8/30 EVD placement in ER, admit Coil embolization Left Acomm aneurysm  8/31 Tolerating SBT, able to follow simple commands  9/1 intermittent episodes of agitation overnight, bilateral wrist restraints added, extubated 9/2 TCD remaining neg for vasospasm 9/4 Waxing/waning mental status, at times somnolent. EVD functioning well. 9/5 More somnolent, NSGY notified. EVD functioning. Na 129 (135). Foley placed for strict I&Os while correcting sodium, HTS 3% started. Febrile to 101.60F, PCT negative. LTM EEG per NSGY. 9/6 HTS 3% ongoing, Na corrected to 136.  9/9 Fevers/leukocytosis > intubated for PNA, airway protection 9/11 Intermittently following commands, no further fevers.  Interim History / Subjective:  No significant events overnight Intermittently following commands today Slightly more interactive with family, nodding EVD with good output Na 134 today, volume up on exam NS discontinued, begin HTS 3% at low rate (50mL/hr) Lasix x 1 Foley for acute urinary retention + retention meds started Family at bedside (daughter/granddaughter, husband)  Objective:  Blood pressure (!) 140/84, pulse 69, temperature 98.4 F (36.9 C), temperature source  Axillary, resp. rate (!) 30, height 5\' 2"  (1.575 m), weight 88 kg, SpO2 100%.    Vent Mode: PRVC FiO2 (%):  [35 %] 35 % Set Rate:  [14 bmp-18 bmp] 18 bmp Vt Set:  [400 mL] 400 mL PEEP:  [5 cmH20] 5 cmH20 Plateau Pressure:  [11 cmH20] 11 cmH20   Intake/Output Summary (Last 24 hours) at 04/24/2023 0724 Last data filed at 04/24/2023 0700 Gross per 24 hour  Intake 2742.66 ml  Output 1367 ml  Net 1375.66 ml   Filed Weights   04/22/23 0425 04/23/23 0445 04/24/23 0500  Weight: 84.2 kg 87.2 kg 88 kg   Physical Examination: General: Acutely ill-appearing elderly woman in NAD. Appears mildly uncomfortable. HEENT: EVD in place with dark brown-red output, anicteric sclera, PERRL 3mm, moist mucous membranes. Cortrak in place. Neuro: Awake, unable to assess orientation. Nodding intermittently to questions. Responds to verbal stimuli. Following commands intermittently. Moves all 4 extremities spontaneously. Generalized weakness.+Corneal, +Cough, and +Gag  CV: RRR, no m/g/r. PULM: Breathing mildly tachypneic and minimally labored on vent (PSV 10/5, FiO2 40%). Lung fields diminished at bilateral bases. GI: Soft, nontender, mildly distended. Normoactive bowel sounds. Extremities: Bilateral symmetric trace LE edema noted. Skin: Warm/slightly diaphoretic, no rashes.  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 CT Head 9/2 shows slight improved ventriculomegaly, cytotoxic edema of anterior left basal ganglia consistent with ischemic infarct. Required HTS 3% 9/5 - 9/7. - Management per NSGY - EVD in place with good output - Repeat brain imaging per NSGY - Frequent neurochecks - AEDs  per NSGY (Keppra for seizure ppx) - LTM EEG discontinued - Continue nimotop, TCDs per protocol - PT/OT/SLP when  extubated/able to participate in care  Acute hypoxemic respiratory failure due to Aspiration pneumonia  Extubated 9/1 CXR stable 9/8. PCT negative. 9/9 with fevers and new leukocytosis > presumably recurrent aspiration vs HCAP - Continue full vent support (4-8cc/kg IBW) - Wean FiO2 for O2 sat > 90% - Daily WUA/SBT as mental status allows, weaning as tolerated 9/11AM - VAP bundle - Pulmonary hygiene - PAD protocol for sedation: Precedex and Fentanyl for goal RASS 0 to -1, minimizing as able - Resp Cx 9/9 with abundant GPCs, rare GPRs, finalized Cx pending - Meropenem/Vanc ongoing   Hyponatremia, improving; CSW versus ?SIADH Hypophosphatemia, improved Hypokalemia, improved Osm 282, urine Osm 765. - Replete electrolytes as indicated - Monitor I&Os - Foley ordered for urinary retention - NS discontinued in the setting of hypoNa, volume overload - HTS 3% reinitiated at low rate, goal Na 135-145  Dysphagia Evaluated by SLP.  - Continue TF/supplements - Will need SLP re-engagement once extubated  Acute urinary retention S/p multiple I&O caths. - Foley ordered - Bethanechol 10mg  TID VT initiated 9/11  GOC Briefly d/w family 9/11 at bedside that we will need to have a solid plan in place once we feel [name] may be ready for extubation; ideally would need to decide if she fails extubation would we reintubate and move toward trach/aggressive measures or transition to more comfort-focused care. Offered PMT consult, they will consider but are not ready to proceed with Palliative consult at this juncture. - Remains FULL CODE code status - Low threshold for PMT consult later this week if no significant progress/family agreeable - Worry extubation will not be successful and need to have a good plan in place for further decisions  Best Practice: (right click and "Reselect all SmartList Selections" daily)   Diet/type: NPO, tube feeds DVT prophylaxis: SCD, Parmer heparin started 9/1 GI prophylaxis:  PPI Lines: N/A EVD Foley:  N/A Code Status:  full code Last date of multidisciplinary goals of care discussion [Per Primary]  Critical care time:   The patient is critically ill with multiple organ system failure and requires high complexity decision making for assessment and support, frequent evaluation and titration of therapies, advanced monitoring, review of radiographic studies and interpretation of complex data.   Critical Care Time devoted to patient care services, exclusive of separately billable procedures, described in this note is 36 minutes.  Tim Lair, PA-C [facility] 04/24/23 7:36 AM  Please see [email or web address] for pager details.  From 7A-7P if no response, please call [phone] After hours, please call ELink [phone]

## 2023-04-24 NOTE — TOC Progression Note (Signed)
Transition of Care [facility]) - Progression Note    Patient Details  Name: Wendy Fleming MRN: [id number] Date of Birth: Jun 22, 1941  Transition of Care [facility]) CM/SW Contact  Mearl Latin, LCSW Phone Number: 04/24/2023, 3:46 PM  Clinical Narrative:    TOC continuing to follow.        Expected Discharge Plan and Services                                               Social Determinants of Health (SDOH) Interventions SDOH Screenings   Tobacco Use: Medium Risk (04/12/2023)    Readmission Risk Interventions     No data to display

## 2023-04-24 NOTE — Progress Notes (Signed)
Transcranial Doppler  Date POD PCO2 HCT BP  MCA ACA PCA OPHT SIPH VERT Basilar  8/31 CK     Right  Left   *  *   *  *   *  31   14  18    *  18   -24  -16   *      9/2 RH     Right  Left   43  28   *  *   *  *   11  10   36  -20   -29  -11   -22      9/4 RH     Right  Left   *  *   *  *   22  *   12  15   32  21   -12  -9   -15            Right  Left                                       9/9 SW      Right  Left   26  27   *  *   *  26   26  22   30  30    *  *   *      9/11 RH     Right  Left   16  28   *  *   *  *   15  13   24  17    -12  -14   -24           Right  Left                                        *unable to insonate   MCA = Middle Cerebral Artery      OPHT = Opthalmic Artery     BASILAR = Basilar Artery   ACA = Anterior Cerebral Artery     SIPH = Carotid Siphon PCA = Posterior Cerebral Artery   VERT = Verterbral Artery                   Normal MCA = 62+\-12 ACA = 50+\-12 PCA = 42+\-23   RT [place] = 1.33 LT [place] = 1.75  Jean Rosenthal, RDMS, RVT

## 2023-04-24 NOTE — Progress Notes (Signed)
Physical Therapy Treatment Patient Details Name: Wendy Fleming MRN: [id number] DOB: 1941/07/25 Today's Date: 04/24/2023   History of Present Illness Pt is an 82 y.o. female who presented 04/12/23 after being found minimally responsive. 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 ETT 8/30 - 9/1. Re-intubated 9/9. PMH: anemia, arthritis, HTN    PT Comments  Treated pt in conjunction with OT to try to safely progress pt's mobility. However, pt remains lethargic, likely largely due to still being on some sedation. Thus, limited session to bed level exercises to maintain ROM in extremities. There are moments she does open her eyes and wakes up during the session, but she does not follow any cues. She did track the therapist to the L a couple times, but could not cross midline to track to the R. She spontaneously moves her L side more than her R. In addition, she displays a strong withdrawal response to noxious stimuli at the L foot, but no response at the R foot. Will continue to follow acutely.      If plan is discharge home, recommend the following: Two people to help with walking and/or transfers;Two people to help with bathing/dressing/bathroom;Assistance with cooking/housework;Direct supervision/assist for medications management;Direct supervision/assist for financial management;Assist for transportation;Help with stairs or ramp for entrance   Can travel by private vehicle        Equipment Recommendations  Other (comment) (defer to next venue of care)    Recommendations for Other Services Rehab consult     Precautions / Restrictions Precautions Precautions: Fall;Other (comment) Precaution Comments: EVD (clamp for sessions); flexiseal, foley, bil mittens Restrictions Weight Bearing Restrictions: No     Mobility  Bed Mobility Overal bed mobility: Needs Assistance Bed Mobility: Supine to  Sit, Rolling Rolling: Total assist, +2 for physical assistance, +2 for safety/equipment   Supine to sit: Total assist, +2 for physical assistance, +2 for safety/equipment, HOB elevated     General bed mobility comments: Total assist to lift her trunk off the bed surface with bed in chair position and to roll to L for pressure relief purposes end of session    Transfers                   General transfer comment: pt unable due to lethargy/fatigue today    Ambulation/Gait               General Gait Details: deferred   Stairs             Wheelchair Mobility     Tilt Bed    Modified Rankin (Stroke Patients Only) Modified Rankin (Stroke Patients Only) Pre-Morbid Rankin Score: No symptoms Modified Rankin: Severe disability     Balance Overall balance assessment: Needs assistance Sitting-balance support: No upper extremity supported, Feet unsupported Sitting balance-Leahy Scale: Zero Sitting balance - Comments: Total assist to lift her trunk off elevated HOB with bed in chair position       Standing balance comment: deferred                            Cognition Arousal: Lethargic, Obtunded Behavior During Therapy: Flat affect Overall Cognitive Status: Impaired/Different from baseline Area of Impairment: Attention, Following commands, Safety/judgement, Awareness, Problem solving                   Current Attention Level: Focused   Following Commands: Follows one step  commands inconsistently Safety/Judgement: Decreased awareness of safety, Decreased awareness of deficits Awareness: Intellectual Problem Solving: Decreased initiation, Slow processing, Difficulty sequencing, Requires verbal cues, Requires tactile cues General Comments: RN reporting pt still mildly sedated but has moments of her being awake, thus attempted session. Pt moves L extremities spontaneously more than her R. Did not follow cues to actively move extremities,  nod, or blink eyes. She did track therapist to her L 2x, but could not track past midline to the R. No response to deep noxious pressure at R foot, but strong withdrawal on L noted.        Exercises General Exercises - Lower Extremity Ankle Circles/Pumps: PROM, Both, 10 reps, Supine Quad Sets: PROM, Both, 10 reps, Supine Heel Slides: PROM, Both, 10 reps, Supine Hip ABduction/ADduction: PROM, Both, 10 reps, Supine    General Comments General comments (skin integrity, edema, etc.): VSS on vent, 35% FiO2 PEEP 5; EVD clamped throughout session, notified RN of end of session to unclamp it; daughter and granddaughter present and supportive throughout session      Pertinent Vitals/Pain Pain Assessment Pain Assessment: Faces Faces Pain Scale: Hurts even more Pain Location: noxious stimuli to eyelids and particularly to L foot Pain Descriptors / Indicators: Grimacing, Guarding Pain Intervention(s): Limited activity within patient's tolerance, Monitored during session, Repositioned    Home Living                          Prior Function            PT Goals (current goals can now be found in the care plan section) Acute Rehab PT Goals Patient Stated Goal: did not state PT Goal Formulation: With family Time For Goal Achievement: 04/28/23 Potential to Achieve Goals: Fair Progress towards PT goals: Not progressing toward goals - comment (limited by lethargy)    Frequency    Min 1X/week      PT Plan      Co-evaluation PT/OT/SLP Co-Evaluation/Treatment: Yes Reason for Co-Treatment: Complexity of the patient's impairments (multi-system involvement);Necessary to address cognition/behavior during functional activity;For patient/therapist safety PT goals addressed during session: Mobility/safety with mobility;Strengthening/ROM        AM-PAC PT "6 Clicks" Mobility   Outcome Measure  Help needed turning from your back to your side while in a flat bed without using  bedrails?: Total Help needed moving from lying on your back to sitting on the side of a flat bed without using bedrails?: Total Help needed moving to and from a bed to a chair (including a wheelchair)?: Total Help needed standing up from a chair using your arms (e.g., wheelchair or bedside chair)?: Total Help needed to walk in hospital room?: Total Help needed climbing 3-5 steps with a railing? : Total 6 Click Score: 6    End of Session Equipment Utilized During Treatment: Oxygen Activity Tolerance: Patient limited by lethargy Patient left: in bed;with call bell/phone within reach;with bed alarm set;with family/visitor present;with restraints reapplied Nurse Communication: Mobility status;Other (comment) (to clamp EVD for session and unclamp after session) PT Visit Diagnosis: Muscle weakness (generalized) (M62.81);Difficulty in walking, not elsewhere classified (R26.2);Other symptoms and signs involving the nervous system (R29.898)     Time: 7829-5621 PT Time Calculation (min) (ACUTE ONLY): 34 min  Charges:    $Therapeutic Exercise: 8-22 mins PT General Charges $$ ACUTE PT VISIT: 1 Visit                     Davinity Fanara  Kennedy Bucker, PT, DPT Acute Rehabilitation Services  Office: [phone]    Bettina Gavia 04/24/2023, 1:37 PM

## 2023-04-24 NOTE — Progress Notes (Signed)
SLP Cancellation Note  Patient Details Name: Wendy Fleming MRN: [id number] DOB: October 15, 1940   Cancelled treatment:       Reason Eval/Treat Not Completed: Medical issues which prohibited therapy. Pt intubated. Will continue attempts.   Gwynneth Aliment, M.A., CF-SLP Speech Language Pathology, Acute Rehabilitation Services  Secure Chat preferred [phone]  04/24/2023, 9:15 AM

## 2023-04-25 DIAGNOSIS — I619 Nontraumatic intracerebral hemorrhage, unspecified: Secondary | ICD-10-CM | POA: Diagnosis not present

## 2023-04-25 DIAGNOSIS — J9601 Acute respiratory failure with hypoxia: Secondary | ICD-10-CM | POA: Diagnosis not present

## 2023-04-25 LAB — BASIC METABOLIC PANEL
Anion gap: 9 (ref 5–15)
BUN: 15 mg/dL (ref 8–23)
CO2: 23 mmol/L (ref 22–32)
Calcium: 8.9 mg/dL (ref 8.9–10.3)
Chloride: 105 mmol/L (ref 98–111)
Creatinine, Ser: 0.59 mg/dL (ref 0.44–1.00)
GFR, Estimated: >60 mL/min (ref >60)
Glucose, Bld: 138 mg/dL — ABNORMAL HIGH (ref 70–99)
Potassium: 3.8 mmol/L (ref 3.5–5.1)
Sodium: 137 mmol/L (ref 135–145)

## 2023-04-25 LAB — GLUCOSE, CAPILLARY
Glucose-Capillary: 110 mg/dL — ABNORMAL HIGH (ref 70–99)
Glucose-Capillary: 133 mg/dL — ABNORMAL HIGH (ref 70–99)
Glucose-Capillary: 135 mg/dL — ABNORMAL HIGH (ref 70–99)
Glucose-Capillary: 143 mg/dL — ABNORMAL HIGH (ref 70–99)
Glucose-Capillary: 151 mg/dL — ABNORMAL HIGH (ref 70–99)
Glucose-Capillary: 157 mg/dL — ABNORMAL HIGH (ref 70–99)

## 2023-04-25 LAB — SODIUM
Sodium: 137 mmol/L (ref 135–145)
Sodium: 139 mmol/L (ref 135–145)
Sodium: 141 mmol/L (ref 135–145)

## 2023-04-25 MED ORDER — FUROSEMIDE 10 MG/ML IJ SOLN
40.0000 mg | Freq: Once | INTRAMUSCULAR | Status: AC
  Administered 2023-04-25: 40 mg via INTRAVENOUS
  Filled 2023-04-25: qty 4

## 2023-04-25 NOTE — Progress Notes (Signed)
NEUROSURGERY PROGRESS NOTE   Pt seen and examined. No issues overnight, able to come off sedation this am.  EXAM: Temp:  [98.2 F (36.8 C)-99.5 F (37.5 C)] 98.9 F (37.2 C) (09/12 0800) Pulse Rate:  [60-103] 91 (09/12 1142) Resp:  [17-36] 26 (09/12 1000) BP: (119-160)/(70-94) 160/85 (09/12 1142) SpO2:  [96 %-100 %] 100 % (09/12 1142) FiO2 (%):  [30 %-35 %] 30 % (09/12 1142) Weight:  [84.4 kg] 84.4 kg (09/12 0500) Intake/Output      09/11 0701 09/12 0700 09/12 0701 09/13 0700   I.V. (mL/kg) 951 (11.3) 63.7 (0.8)   Other 25    NG/GT 1195 200   IV Piggyback 74.7    Total Intake(mL/kg) 2245.7 (26.6) 263.7 (3.1)   Urine (mL/kg/hr) 4240 (2.1) 275 (0.7)   Drains 132 14   Stool 400    Total Output 4772 289   Net -2526.3 -25.3        Stool Occurrence 1 x     Eyes open to spontaneously Breathing over vent, somewhat tachypneic CN grossly intact Following commands - shows thumb/wiggles toes bilaterally Generalized weakness EVD in place, patent @ with bloody CSF  LABS: Lab Results  Component Value Date   CREATININE 0.59 04/25/2023   BUN 15 04/25/2023   NA 137 04/25/2023   K 3.8 04/25/2023   CL 105 04/25/2023   CO2 23 04/25/2023   Lab Results  Component Value Date   WBC 9.6 04/24/2023   HGB 9.8 (L) 04/24/2023   HCT 31.6 (L) 04/24/2023   MCV 92.9 04/24/2023   PLT 210 04/24/2023    TCD: Date POD PCO2 HCT BP   MCA ACA PCA OPHT SIPH VERT Basilar  8/31 CK         Right  Left   *  *   *  *   *  31   14  18    *  18   -24  -16   *       9/2 RH         Right  Left   43  28   *  *   *  *   11  10   36  -20   -29  -11   -22       9/4 RH         Right  Left   *  *   *  *   22  *   12  15   32  21   -12  -9   -15                   Right  Left                                                     9/9 SW           Right  Left   26  27   *  *   *  26   26  22   30  30    *  *   *        9/11 RH         Right  Left   16  28   *  *   *  *  15  13   24  17    -12  -14   -24          IMPRESSION: - 82 y.o. female SAH d# 14 s/p coil embolization Acom aneurysm and EVD for associated HCP, neurologically subtly improved likely from discontinuing sedation  On abx for possible aspiration pna  PLAN: - Antipyretics for fever - Cont Nimotop - Permissive HTN up to SBP - Keep EVD open at for now. Can determine need for permanent CSF diversion after we determine possible need for longer-term airway/vent support,  i.e trach     Lisbeth Renshaw, MD [facility]

## 2023-04-25 NOTE — Progress Notes (Signed)
SLP Cancellation Note  Patient Details Name: Wendy Fleming MRN: [id number] DOB: 06/24/1941   Cancelled treatment:       Reason Eval/Treat Not Completed: Medical issues which prohibited therapy (remains on vent). Will continue to follow.    Mahala Menghini., M.A. CCC-SLP Acute Rehabilitation Services Office [phone]  Secure chat preferred  04/25/2023, 10:35 AM

## 2023-04-25 NOTE — Progress Notes (Signed)
NAME:  Wendy Fleming, MRN:  [id number], DOB:  06/20/1941, LOS: 13 ADMISSION DATE:  04/12/2023, CONSULTATION DATE:  04/12/2023 REFERRING MD:  NSGY, CHIEF COMPLAINT:  AMS   History of Present Illness:  82 year old woman who presented to [facility] ED 8/30 after husband found her down minimally responsive.  Vomited multiple times en route to ER.  Intubated for airway protection in ER.  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  NSGY to place EVD and determine best method of securing aneurysm.  PCCM to consult for vent management.  [facility] 3/4 mFS 2 (thin, +IVH)  Pertinent Medical History:  HTN Anemia  Significant Hospital Events: Including procedures, antibiotic start and stop dates in addition to other pertinent events   8/30 EVD placement in ER, admit Coil embolization Left Acomm aneurysm  8/31 Tolerating SBT, able to follow simple commands  9/1 intermittent episodes of agitation overnight, bilateral wrist restraints added, extubated 9/2 TCD remaining neg for vasospasm 9/4 Waxing/waning mental status, at times somnolent. EVD functioning well. 9/5 More somnolent, NSGY notified. EVD functioning. Na 129 (135). Foley placed for strict I&Os while correcting sodium, HTS 3% started. Febrile to 101.55F, PCT negative. LTM EEG per NSGY. 9/6 HTS 3% ongoing, Na corrected to 136.  9/9 Fevers/leukocytosis > intubated for PNA, airway protection 9/11 Intermittently following commands, no further fevers.  Interim History / Subjective:  No sig change overnight  Off sedation this morning, mild tachypnea, precedex turned back on at 0.1 Following commands but very weak    Objective:  Blood pressure 137/77, pulse 77, temperature 98.9 F (37.2 C), temperature source Axillary, resp. rate (!) 29, height 5\' 2"  (1.575 m), weight 84.4 kg, SpO2 100%.    Vent Mode: PRVC FiO2 (%):  [30 %-35 %] 30 % Set Rate:  [18 bmp] 18 bmp Vt Set:  [400 mL] 400 mL PEEP:  [5 cmH20] 5 cmH20 Pressure  Support:  [10 cmH20] 10 cmH20 Plateau Pressure:  [12 cmH20-14 cmH20] 12 cmH20   Intake/Output Summary (Last 24 hours) at 04/25/2023 0903 Last data filed at 04/25/2023 0800 Gross per 24 hour  Intake 1975.82 ml  Output 4766 ml  Net -2790.18 ml   Filed Weights   04/23/23 0445 04/24/23 0500 04/25/23 0500  Weight: 87.2 kg 88 kg 84.4 kg   Physical Examination: General:  chronically ill appearing female, NAD  HEENT: MM pink/moist, ETT Neuro: on low dose precedex, opens eyes to voice, follows commands, significant generalized weakness, MAE CV: s1s2 rrr, no m/r/g PULM:  resps even non labored on vent, diminished bases, mild tachypnea RR 25-30, no sig increased WOB. 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 CT Head 9/2 shows slight improved ventriculomegaly, cytotoxic edema of anterior left basal ganglia consistent with ischemic infarct. Required HTS 3% 9/5 - 9/7. - Management per NSGY - EVD per nsgy - Repeat brain imaging per NSGY - Frequent neurochecks - AEDs per NSGY (Keppra for seizure ppx) - Continue nimotop, TCDs per protocol - hypertonic saline  - PT/OT/SLP when extubated/able to participate in care  Acute hypoxemic respiratory failure due to Aspiration pneumonia  Extubated 9/1 CXR stable 9/8. PCT negative. 9/9 with fevers and new leukocytosis > presumably recurrent  aspiration vs HCAP - Continue full vent support (4-8cc/kg IBW) - Wean FiO2 for O2 sat > 90% - Daily WUA/SBT as mental status allows, weaning as tolerated 9/11AM - VAP bundle - Pulmonary hygiene - PAD protocol for sedation: Precedex and Fentanyl for goal RASS 0 to -1, minimizing as able. Daily WUA - Resp Cx 9/9 with abundant GPCs, rare  GPRs>>normal flora - monitor off abx    Hyponatremia, improving; CSW versus ?SIADH Hypophosphatemia, improved Hypokalemia, improved Osm 282, urine Osm 765. - Replete electrolytes as indicated - Monitor I&Os - Foley ordered for urinary retention - NS discontinued in the setting of hypoNa, volume overload - HTS 3% reinitiated at low rate, goal Na 135-145  Dysphagia Evaluated by SLP.  - Continue TF/supplements - Will need SLP Wendy-engagement once extubated  Acute urinary retention S/p multiple I&O caths. - Foley ordered - Bethanechol 10mg  TID VT initiated 9/11  GOC Briefly d/w family 9/11 at bedside that we will need to have a solid plan in place once we feel Wendy Fleming may be ready for extubation; ideally would need to decide if she fails extubation would we reintubate and move toward trach/aggressive measures or transition to more comfort-focused care. Offered PMT consult, they will consider but are not ready to proceed with Palliative consult at this juncture. - Remains FULL CODE code status - Low threshold for PMT consult later this week if no significant progress/family agreeable - Worry extubation will not be successful and need to have a good plan in place for further decisions  Family updated at bedside 9/12  Best Practice: (right click and "Reselect all SmartList Selections" daily)   Diet/type: NPO, tube feeds DVT prophylaxis: SCD, [facility] heparin started 9/1 GI prophylaxis: PPI Lines: N/A EVD Foley:  N/A Code Status:  full code Last date of multidisciplinary goals of care discussion [Per Primary]  Critical care time:   The patient is critically ill with multiple organ system failure and requires high complexity decision making for assessment and support, frequent evaluation and titration of therapies, advanced monitoring, review of radiographic studies and interpretation of complex data.   Critical Care Time devoted to patient care services, exclusive of separately billable  procedures, described in this note is 35 minutes.  Danford Bad, NP [facility] 04/25/23 9:03 AM  Please see [email or web address] for pager details.  From 7A-7P if no response, please call [phone] After hours, please call ELink [phone]

## 2023-04-26 ENCOUNTER — Inpatient Hospital Stay (HOSPITAL_COMMUNITY): Payer: Medicare HMO

## 2023-04-26 DIAGNOSIS — J9601 Acute respiratory failure with hypoxia: Secondary | ICD-10-CM | POA: Diagnosis not present

## 2023-04-26 LAB — GLUCOSE, CAPILLARY
Glucose-Capillary: 132 mg/dL — ABNORMAL HIGH (ref 70–99)
Glucose-Capillary: 135 mg/dL — ABNORMAL HIGH (ref 70–99)
Glucose-Capillary: 136 mg/dL — ABNORMAL HIGH (ref 70–99)
Glucose-Capillary: 136 mg/dL — ABNORMAL HIGH (ref 70–99)
Glucose-Capillary: 158 mg/dL — ABNORMAL HIGH (ref 70–99)

## 2023-04-26 LAB — BASIC METABOLIC PANEL
Anion gap: 10 (ref 5–15)
BUN: 21 mg/dL (ref 8–23)
CO2: 22 mmol/L (ref 22–32)
Calcium: 9 mg/dL (ref 8.9–10.3)
Chloride: 108 mmol/L (ref 98–111)
Creatinine, Ser: 0.56 mg/dL (ref 0.44–1.00)
GFR, Estimated: >60 mL/min (ref >60)
Glucose, Bld: 147 mg/dL — ABNORMAL HIGH (ref 70–99)
Potassium: 3.9 mmol/L (ref 3.5–5.1)
Sodium: 140 mmol/L (ref 135–145)

## 2023-04-26 LAB — CBC
HCT: 35.2 % — ABNORMAL LOW (ref 36.0–46.0)
Hemoglobin: 11 g/dL — ABNORMAL LOW (ref 12.0–15.0)
MCH: 29.7 pg (ref 26.0–34.0)
MCHC: 31.3 g/dL (ref 30.0–36.0)
MCV: 95.1 fL (ref 80.0–100.0)
Platelets: 312 10*3/uL (ref 150–400)
RBC: 3.7 MIL/uL — ABNORMAL LOW (ref 3.87–5.11)
RDW: 14.6 % (ref 11.5–15.5)
WBC: 9.9 10*3/uL (ref 4.0–10.5)
nRBC: 0 % (ref 0.0–0.2)

## 2023-04-26 MED ORDER — SODIUM CHLORIDE 0.9 % IV BOLUS
250.0000 mL | Freq: Once | INTRAVENOUS | Status: AC
  Administered 2023-04-26: 250 mL via INTRAVENOUS

## 2023-04-26 MED ORDER — OXYCODONE HCL 5 MG PO TABS
5.0000 mg | ORAL_TABLET | Freq: Four times a day (QID) | ORAL | Status: AC
  Administered 2023-04-26 – 2023-04-27 (×4): 5 mg
  Filled 2023-04-26 (×4): qty 1

## 2023-04-26 MED ORDER — LORAZEPAM 2 MG/ML IJ SOLN
1.0000 mg | Freq: Once | INTRAMUSCULAR | Status: AC
  Administered 2023-04-26: 1 mg via INTRAVENOUS
  Filled 2023-04-26: qty 1

## 2023-04-26 MED ORDER — METOPROLOL TARTRATE 5 MG/5ML IV SOLN: 5.0000 mg | Freq: Once | INTRAVENOUS | Status: DC

## 2023-04-26 NOTE — Progress Notes (Signed)
NEUROSURGERY PROGRESS NOTE   Pt seen and examined. No issues overnight. Has been off sedation.  EXAM: Temp:  [98.2 F (36.8 C)-99.5 F (37.5 C)] 98.2 F (36.8 C) (09/13 0800) Pulse Rate:  [61-102] 68 (09/13 0800) Resp:  [18-34] 25 (09/13 0800) BP: (108-163)/(70-98) 150/79 (09/13 0800) SpO2:  [96 %-100 %] 100 % (09/13 0800) FiO2 (%):  [30 %] 30 % (09/13 0750) Weight:  [82 kg] 82 kg (09/13 0500) Intake/Output      09/12 0701 09/13 0700 09/13 0701 09/14 0700   I.V. (mL/kg) 693.5 (8.5) 79.8 (1)   Other     NG/GT 1295 80   IV Piggyback     Total Intake(mL/kg) 1988.5 (24.3) 159.8 (1.9)   Urine (mL/kg/hr) 3005 (1.5) 170 (0.6)   Drains 108 5   Stool 800    Total Output 3913 175   Net -1924.5 -15.2         Eyes open to spontaneously Breathing over vent CN grossly intact Following commands - shows thumb/wiggles toes bilaterally Generalized weakness EVD in place, patent @ with bloody CSF  LABS: Lab Results  Component Value Date   CREATININE 0.56 04/26/2023   BUN 21 04/26/2023   NA 140 04/26/2023   K 3.9 04/26/2023   CL 108 04/26/2023   CO2 22 04/26/2023   Lab Results  Component Value Date   WBC 9.9 04/26/2023   HGB 11.0 (L) 04/26/2023   HCT 35.2 (L) 04/26/2023   MCV 95.1 04/26/2023   PLT 312 04/26/2023    TCD: Date POD PCO2 HCT BP   MCA ACA PCA OPHT SIPH VERT Basilar  8/31 CK         Right  Left   *  *   *  *   *  31   14  18    *  18   -24  -16   *       9/2 RH         Right  Left   43  28   *  *   *  *   11  10   36  -20   -29  -11   -22       9/4 RH         Right  Left   *  *   *  *   22  *   12  15   32  21   -12  -9   -15                   Right  Left                                                     9/9 SW           Right  Left   26  27   *  *   *  26   26  22   30  30    *  *   *       9/11 RH         Right  Left   16  28   *  *   *  *   15  13   24   17    -12  -  14   -24          IMPRESSION: - 82 y.o. female SAH d# 15 s/p coil embolization Acom aneurysm and EVD for associated HCP, neurologically subtly improved likely from discontinuing sedation  On abx for possible aspiration pna  PLAN: - Antipyretics for fever - Cont Nimotop to complete 21d - Can begin to treat HTN, no need for further permissive hypertension as she is likely through spasm period. - Keep EVD open at for now. Can determine need for permanent CSF diversion after we determine possible need for longer-term airway/vent support,  i.e trach   Reviewed plan above with family at bedside. All questions were answered.  Lisbeth Renshaw, MD [facility]

## 2023-04-26 NOTE — Progress Notes (Signed)
Pink tinged urine reported to E-link.

## 2023-04-26 NOTE — Progress Notes (Signed)
NAME:  Wendy Fleming, MRN:  [id number], DOB:  Oct 08, 1940, LOS: 14 ADMISSION DATE:  03/29/2023, CONSULTATION DATE:  04/06/2023 REFERRING MD:  NSGY, CHIEF COMPLAINT:  AMS   History of Present Illness:  82 year old woman who presented to [facility] ED 8/30 after husband found her down minimally responsive.  Vomited multiple times en route to ER.  Intubated for airway protection in ER.  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  NSGY to place EVD and determine best method of securing aneurysm.  PCCM to consult for vent management.  [facility]unt [facility] 3/4 mFS 2 (thin, +IVH)  Pertinent Medical History:  HTN Anemia  Significant Hospital Events: Including procedures, antibiotic start and stop dates in addition to other pertinent events   8/30 EVD placement in ER, admit Coil embolization Left Acomm aneurysm  8/31 Tolerating SBT, able to follow simple commands  9/1 intermittent episodes of agitation overnight, bilateral wrist restraints added, extubated 9/2 TCD remaining neg for vasospasm 9/4 Waxing/waning mental status, at times somnolent. EVD functioning well. 9/5 More somnolent, NSGY notified. EVD functioning. Na 129 (135). Foley placed for strict I&Os while correcting sodium, HTS 3% started. Febrile to 101.30F, PCT negative. LTM EEG per NSGY. 9/6 HTS 3% ongoing, Na corrected to 136.  9/9 Fevers/leukocytosis > re-intubated for PNA, airway protection 9/11 Intermittently following commands, no further fevers. 9/12 Off sedation this morning, mild tachypnea. Following commands but very weak  9/13 tolerated SBT trial x 4 hours day prior before becoming too fatigued to continue.  On SBT this a.m. and looks comfortable but significantly deconditioned  Interim History / Subjective:  Family at bedside and updated extensively  Objective:  Blood pressure (!) 150/79, pulse 68, temperature 98.2 F (36.8 C), temperature source Axillary, resp. rate (!) 25, height 5\' 2"  (1.575 m), weight 82 kg, SpO2  100%.    Vent Mode: CPAP;PSV FiO2 (%):  [30 %] 30 % Set Rate:  [18 bmp] 18 bmp Vt Set:  [400 mL] 400 mL PEEP:  [5 cmH20] 5 cmH20 Pressure Support:  [12 cmH20] 12 cmH20 Plateau Pressure:  [18 cmH20] 18 cmH20   Intake/Output Summary (Last 24 hours) at 04/26/2023 5284 Last data filed at 04/26/2023 0800 Gross per 24 hour  Intake 1854.54 ml  Output 4005 ml  Net -2150.46 ml   Filed Weights   04/24/23 0500 04/25/23 0500 04/26/23 0500  Weight: 88 kg 84.4 kg 82 kg   Physical Examination: General acute on chronic ill-appearing severely deconditioned elderly female lying in bed in no acute distress HEENT: ETT, MM pink/moist, PERRL,  Neuro: Eyes open but able to follow simple commands CV: s1s2 regular rate and rhythm, no murmur, rubs, or gallops,  PULM: Clear to auscultation bilaterally, no increased work of breathing, no added breath sounds GI: soft, bowel sounds active in all 4 quadrants, non-tender, non-distended, tolerating TF Extremities: warm/dry, no edema  Skin: no rashes or lesions  Resolved Hospital Problem List:   N/A  Assessment & Plan:  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 CT Head 9/2 shows slight improved ventriculomegaly, cytotoxic edema of anterior left basal ganglia consistent with ischemic infarct. Required HTS 3% 9/5 - 9/7. P: Primary management per neurosurgery EVD per neurosurgery Continue AEDs and Ametop TCD's per protocol PT/OT/SLP as able  Acute hypoxemic respiratory failure due to Aspiration pneumonia  -Extubated 9/1 CXR stable 9/8. PCT negative decompensated 9/9 with fevers and new leukocytosis > presumably recurrent aspiration vs  HCAP resulting in reintubation  P: Ongoing discussion with family regarding need for prolonged ventilator support.  Plan to reevaluate patient this afternoon to assess ability to maintain SBT.   Continue ventilator support with lung protective  strategies  Wean PEEP and FiO2 for sats greater than 90%. Head of bed elevated 30 degrees. Plateau pressures less than 30 cm H20.  Follow intermittent chest x-ray and ABG.   SAT/SBT as tolerated, mentation preclude extubation  Ensure adequate pulmonary hygiene  Follow cultures  VAP bundle in place  PAD protocol  Hyponatremia, improving -CSW versus ?SIADH, hypertonic saline stopped 9/13 Hypophosphatemia, improved Hypokalemia, improved -Osm 282, urine Osm 765. P: Stop hypertonic saline Trend to be managed If hyponatremia returns consider starting salt tabs Optimize electrolytes  Dysphagia -Evaluated by SLP.  P: Continue tube feeds per cortrak SLP as able RD following  Acute urinary retention -S/p multiple I&O caths. P: Continue Foley Retention medications  GOC Discussion again held at bedside with family 9/13 regarding need to have a solid plan in place once we feel [name] may be ready for extubation; ideally would need to decide if she fails extubation would we reintubate and move toward trach/aggressive measures or transition to more comfort-focused care.  -Family has agreed to PMT consult, will order - Remains FULL CODE code status - Worry extubation will not be successful and need to have a good plan in place for further decisions  Family updated at bedside 9/13  Best Practice: (right click and "Reselect all SmartList Selections" daily)   Diet/type: NPO, tube feeds DVT prophylaxis: SCD, [place] heparin started 9/1 GI prophylaxis: PPI Lines: N/A EVD Foley:  N/A Code Status:  full code Last date of multidisciplinary goals of care discussion [Per Primary]  Critical care time:  CRITICAL CARE Performed by: Lisanne Ponce D. Harris   Total critical care time: 37 minutes  Critical care time was exclusive of separately billable procedures and treating other patients.  Critical care was necessary to treat or prevent imminent or life-threatening deterioration.  Critical care  was time spent personally by me on the following activities: development of treatment plan with patient and/or surrogate as well as nursing, discussions with consultants, evaluation of patient's response to treatment, examination of patient, obtaining history from patient or surrogate, ordering and performing treatments and interventions, ordering and review of laboratory studies, ordering and review of radiographic studies, pulse oximetry and re-evaluation of patient's condition.   Helene Bernstein D. Harris, NP-C [facility] Personal contact information can be found on Amion  If no contact or response made please call [phone] 04/26/2023, 10:06 AM

## 2023-04-26 NOTE — Progress Notes (Signed)
Occupational Therapy Treatment Patient Details Name: Wendy Fleming MRN: [id number] DOB: 04/01/1941 Today's Date: 04/26/2023   History of present illness Pt is an 82 y.o. female who presented 05-04-2023 after being found minimally responsive. 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 S/p coil embolization L ACOM aneurysm 05-04-2023. ETT 05-04-23 - 9/1. Re-intubated 9/9. PMH: anemia, arthritis, HTN   OT comments  Patient with incremental progress towards goals and off all sedation. Able to withdraw to noxious stimuli on R, and attempts to visually track to R but does not achieve past midline. Transitioned to EOB with total A of 2, however attempts to engage noted by initially requiring total assist for static sitting balance but then progressing to moments of modA as her neck and core activation improved. Patient benefiting from tactile and verbal cues, however cannot reach against gravity, show two fingers, or give thumbs up. Patient VSS while sitting EOB but once pt was dependently placed in supine and scooted superiorly in bed her HR began to rise (up to 149 bpm) and she had a wheezing sound on the vent and appeared to need suctioning. Notified RN and RT. RT provided suctioning and per RN, she had to be administered some sedatives to calm her back down. OT will continue to follow.       If plan is discharge home, recommend the following:  Assist for transportation;Assistance with cooking/housework;Two people to help with walking and/or transfers;A lot of help with bathing/dressing/bathroom;Direct supervision/assist for medications management;Direct supervision/assist for financial management   Equipment Recommendations  Other (comment) (pending progress)    Recommendations for Other Services      Precautions / Restrictions Precautions Precautions: Fall;Other (comment) Precaution Comments: EVD (clamp for sessions), intubated, flexiseal, foley, watch  HR Restrictions Weight Bearing Restrictions: No       Mobility Bed Mobility Overal bed mobility: Needs Assistance Bed Mobility: Supine to Sit, Rolling, Sit to Supine Rolling: Total assist, +2 for physical assistance, +2 for safety/equipment   Supine to sit: Total assist, +2 for physical assistance, +2 for safety/equipment, HOB elevated Sit to supine: Total assist, +2 for physical assistance, +2 for safety/equipment, HOB elevated   General bed mobility comments: Pt cued to bring legs to the L with noted muscle activation in her L leg but minimal movement. Pt needed total assist x2 using the bed pad to transition supine <> sit L EOB and to roll to the R for pressure relief positioning end of session.    Transfers                   General transfer comment: deferred     Balance Overall balance assessment: Needs assistance Sitting-balance support: Single extremity supported, Bilateral upper extremity supported, Feet supported Sitting balance-Leahy Scale: Poor Sitting balance - Comments: Pt initially pushing through her bil UEs, primarily pushing herself to her L, needing total assist-maxA for sitting balance. Eventually, pt began to lift her head and activate her core, progressing to [facility] for static sitting balance intermittently. Delayed and poor reactional strategies noted. Postural control: Posterior lean, Left lateral lean     Standing balance comment: deferred                           ADL either performed or assessed with clinical judgement   ADL Overall ADL's : Needs assistance/impaired Eating/Feeding: NPO   Grooming: Wash/dry hands;Wash/dry face;Oral care;Total assistance  Functional mobility during ADLs: Total assistance;+2 for physical assistance;+2 for safety/equipment General ADL Comments: Total A at EOB, increased ability to participate noted    Extremity/Trunk Assessment              Vision    Additional Comments: Able to track past midline intermittently   Perception Perception Perception: Impaired   Praxis Praxis Praxis: Impaired    Cognition Arousal: Alert Behavior During Therapy: Flat affect (appeared to try to smile a couple times) Overall Cognitive Status: Impaired/Different from baseline Area of Impairment: Attention, Following commands, Safety/judgement, Awareness, Problem solving                   Current Attention Level: Focused   Following Commands: Follows one step commands inconsistently, Follows one step commands with increased time Safety/Judgement: Decreased awareness of safety, Decreased awareness of deficits Awareness: Intellectual Problem Solving: Decreased initiation, Slow processing, Difficulty sequencing, Requires verbal cues, Requires tactile cues General Comments: RN reports pt is no longer on any sedation. Pt eyes open and pt alert throughout session. She attends to people briefly when her name is called and tracks to the L but has difficulty tracking to the R. Seems to acknowledge familiar faces and respond to them better (like daughter and son-in-law). Follows simple commands with L UE and bil lower extremities ~50% of the time. Decreased attention to R side. Slow to process and respond to LOB bouts in sitting, needing assistance to recover.        Exercises      Shoulder Instructions       General Comments HR 120s-130s while sitting EOB, after pt was placed in supine and dependently transitioned superiorly in bed noted wheezing and pt visibily turned red in the face and was coughing/gagging on vent with HR elevating as high as 149 bpm, immediately notified RN and RT and RT suctioned pt    Pertinent Vitals/ Pain       Pain Assessment Pain Assessment: Faces Faces Pain Scale: Hurts even more Pain Location: with coughing/gagging on vent Pain Descriptors / Indicators: Grimacing, Guarding Pain Intervention(s): Limited activity within  patient's tolerance, Monitored during session, Repositioned (notified RN for suctioning needs when applicable)  Home Living                                          Prior Functioning/Environment              Frequency  Min 1X/week        Progress Toward Goals  OT Goals(current goals can now be found in the care plan section)     Acute Rehab OT Goals Patient Stated Goal: unable to state OT Goal Formulation: Patient unable to participate in goal setting Time For Goal Achievement: 04/29/23 Potential to Achieve Goals: Fair  Plan      Co-evaluation      Reason for Co-Treatment: Complexity of the patient's impairments (multi-system involvement);Necessary to address cognition/behavior during functional activity;For patient/therapist safety;To address functional/ADL transfers PT goals addressed during session: Mobility/safety with mobility;Balance        AM-PAC OT "6 Clicks" Daily Activity     Outcome Measure   Help from another person eating meals?: Total Help from another person taking care of personal grooming?: Total Help from another person toileting, which includes using toliet, bedpan, or urinal?: Total Help from another person bathing (including washing, rinsing, drying)?: Total  Help from another person to put on and taking off regular upper body clothing?: Total Help from another person to put on and taking off regular lower body clothing?: Total 6 Click Score: 6    End of Session    OT Visit Diagnosis: Muscle weakness (generalized) (M62.81);Hemiplegia and hemiparesis;Other symptoms and signs involving cognitive function;Other symptoms and signs involving the nervous system (R29.898);Other abnormalities of gait and mobility (R26.89) Hemiplegia - Right/Left: Right Hemiplegia - dominant/non-dominant: Dominant 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 Jadine Brumley, OTR/L Acute Rehabilitation Services [phone]   Cherlyn Cushing 04/26/2023, 3:58 PM

## 2023-04-26 NOTE — Progress Notes (Signed)
Physical Therapy Treatment Patient Details Name: Wendy Fleming MRN: [id number] DOB: 1941-06-15 Today's Date: 04/26/2023   History of Present Illness Pt is an 82 y.o. female who presented 03/26/2023 after being found minimally responsive. 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 ETT 8/30 - 9/1. Re-intubated 9/9. PMH: anemia, arthritis, HTN    PT Comments  RN reporting pt is now off all sedation. Pt was alert throughout the session and able to follow simple commands inconsistently with her L UE and bil lower extremities today. She does withdraw to noxious stimuli in her R extremities today also. She continues to display difficulty in tracking past midline to her R though. Pt was able to progress to sitting EOB, initially requiring total assist for static sitting balance but then progressing to moments of modA as her neck and core activation improved the longer she sat there. Tactile and verbal cues provided to activate these muscles and to track and attend to her R extremities while sitting EOB. She did require total assist x2 for all bed mobility aspects due to gross weakness though. Of note, pt's VSS while sitting EOB but once pt was dependently placed in supine and scooted superiorly in bed her HR began to rise (up to 149 bpm) and she had a wheezing sound on the vent and appeared to need suctioning. Notified RN and RT. RT provided suctioning and per RN, she had to be administered some sedatives to calm her back down. Will continue to follow acutely.    If plan is discharge home, recommend the following: Two people to help with walking and/or transfers;Two people to help with bathing/dressing/bathroom;Assistance with cooking/housework;Direct supervision/assist for medications management;Direct supervision/assist for financial management;Assist for transportation;Help with stairs or ramp for entrance   Can  travel by private vehicle        Equipment Recommendations  Other (comment) (defer to next venue of care)    Recommendations for Other Services Rehab consult     Precautions / Restrictions Precautions Precautions: Fall;Other (comment) Precaution Comments: EVD (clamp for sessions), intubated, flexiseal, foley, watch HR Restrictions Weight Bearing Restrictions: No     Mobility  Bed Mobility Overal bed mobility: Needs Assistance Bed Mobility: Supine to Sit, Rolling, Sit to Supine Rolling: Total assist, +2 for physical assistance, +2 for safety/equipment   Supine to sit: Total assist, +2 for physical assistance, +2 for safety/equipment, HOB elevated Sit to supine: Total assist, +2 for physical assistance, +2 for safety/equipment, HOB elevated   General bed mobility comments: Pt cued to bring legs to the L with noted muscle activation in her L leg but minimal movement. Pt needed total assist x2 using the bed pad to transition supine <> sit L EOB and to roll to the R for pressure relief positioning end of session.    Transfers                   General transfer comment: deferred    Ambulation/Gait               General Gait Details: deferred   Stairs             Wheelchair Mobility     Tilt Bed    Modified Rankin (Stroke Patients Only) Modified Rankin (Stroke Patients Only) Pre-Morbid Rankin Score: No symptoms Modified Rankin: Severe disability     Balance Overall balance assessment: Needs assistance Sitting-balance support: Single extremity supported, Bilateral upper extremity supported, Feet supported  Sitting balance-Leahy Scale: Poor Sitting balance - Comments: Pt initially pushing through her bil UEs, primarily pushing herself to her L, needing total assist-maxA for sitting balance. Eventually, pt began to lift her head and activate her core, progressing to [facility] for static sitting balance intermittently. Delayed and poor reactional strategies  noted. Postural control: Posterior lean, Left lateral lean     Standing balance comment: deferred                            Cognition Arousal: Alert Behavior During Therapy: Flat affect (appeared to try to smile a couple times) Overall Cognitive Status: Impaired/Different from baseline Area of Impairment: Attention, Following commands, Safety/judgement, Awareness, Problem solving                   Current Attention Level: Focused   Following Commands: Follows one step commands inconsistently, Follows one step commands with increased time Safety/Judgement: Decreased awareness of safety, Decreased awareness of deficits Awareness: Intellectual Problem Solving: Decreased initiation, Slow processing, Difficulty sequencing, Requires verbal cues, Requires tactile cues General Comments: RN reports pt is no longer on any sedation. Pt eyes open and pt alert throughout session. She attends to people briefly when her name is called and tracks to the L but has difficulty tracking to the R. Seems to acknowledge familiar faces and respond to them better (like daughter and son-in-law). Follows simple commands with L UE and bil lower extremities ~50% of the time. Decreased attention to R side. Slow to process and respond to LOB bouts in sitting, needing assistance to recover.        Exercises General Exercises - Lower Extremity Long Arc Quad: AROM, AAROM, Both, 5 reps, Seated (moves L >R but not fully through gravity (<50%))    General Comments General comments (skin integrity, edema, etc.): HR 120s-130s while sitting EOB, after pt was placed in supine and dependently transitioned superiorly in bed noted wheezing and pt visibily turned red in the face and was coughing/gagging on vent with HR elevating as high as 149 bpm, immediately notified RN and RT and RT suctioned pt      Pertinent Vitals/Pain Pain Assessment Pain Assessment: Faces Faces Pain Scale: Hurts even more Pain  Location: with coughing/gagging on vent Pain Descriptors / Indicators: Grimacing, Guarding Pain Intervention(s): Monitored during session, Limited activity within patient's tolerance, Repositioned, Other (comment) (notified RN for suctioning needs when applicable)    Home Living                          Prior Function            PT Goals (current goals can now be found in the care plan section) Acute Rehab PT Goals Patient Stated Goal: did not state PT Goal Formulation: With patient/family Time For Goal Achievement: 04/28/23 Potential to Achieve Goals: Fair Progress towards PT goals: Progressing toward goals (slowly)    Frequency    Min 1X/week      PT Plan      Co-evaluation PT/OT/SLP Co-Evaluation/Treatment: Yes Reason for Co-Treatment: Complexity of the patient's impairments (multi-system involvement);Necessary to address cognition/behavior during functional activity;For patient/therapist safety;To address functional/ADL transfers PT goals addressed during session: Mobility/safety with mobility;Balance        AM-PAC PT "6 Clicks" Mobility   Outcome Measure  Help needed turning from your back to your side while in a flat bed without using bedrails?: Total Help needed  moving from lying on your back to sitting on the side of a flat bed without using bedrails?: Total Help needed moving to and from a bed to a chair (including a wheelchair)?: Total Help needed standing up from a chair using your arms (e.g., wheelchair or bedside chair)?: Total Help needed to walk in hospital room?: Total Help needed climbing 3-5 steps with a railing? : Total 6 Click Score: 6    End of Session Equipment Utilized During Treatment: Oxygen Activity Tolerance: Patient tolerated treatment well;Other (comment) (until end where pt became agitated on vent with HR elevating and pt needing suctioning) Patient left: in bed;with call bell/phone within reach;with bed alarm set;with  family/visitor present Nurse Communication: Mobility status;Other (comment) (HR elevating and pt needing suctioning) PT Visit Diagnosis: Muscle weakness (generalized) (M62.81);Difficulty in walking, not elsewhere classified (R26.2);Other symptoms and signs involving the nervous system (R29.898)     Time: 3086-5784 PT Time Calculation (min) (ACUTE ONLY): 41 min  Charges:    $Therapeutic Activity: 8-22 mins $Neuromuscular Re-education: 8-22 mins PT General Charges $$ ACUTE PT VISIT: 1 Visit                     Virgil Benedict, PT, DPT Acute Rehabilitation Services  Office: [phone]    Bettina Gavia 04/26/2023, 1:57 PM

## 2023-04-26 NOTE — Progress Notes (Signed)
eLink Physician-Brief Progress Note [name] DOB: [birth year] MRN: [id number]   Date of Service  04/26/2023  HPI/Events of Note  Patient with soft blood pressures, she is - 2.5 liters following diuresis, she has had slightly pink tinged urine which has cleared up at this time per bedside RN, patient is only on DVT prophylaxis and there is not history of urethral trauma.  eICU Interventions  Bedside RN will monitor urine color, NS 250 ml iv fluid bolus x 1, check BNP to gauge volume status.        Thomasene Lot Maeva Dant 04/26/2023, 11:19 PM

## 2023-04-26 NOTE — Progress Notes (Signed)
E-link notified of low blood pressure.

## 2023-04-26 NOTE — TOC Progression Note (Signed)
Transition of Care [facility]) - Progression Note    Patient Details  Name: Wendy Fleming MRN: [id number] Date of Birth: 1941/06/30  Transition of Care [facility]) CM/SW Contact  Mearl Latin, LCSW Phone Number: 04/26/2023, 9:26 AM  Clinical Narrative:    CSW continuing to follow for needs. Patient remains intubated.         Expected Discharge Plan and Services                                               Social Determinants of Health (SDOH) Interventions SDOH Screenings   Tobacco Use: Medium Risk (04/12/2023)    Readmission Risk Interventions     No data to display

## 2023-04-27 DIAGNOSIS — R4182 Altered mental status, unspecified: Secondary | ICD-10-CM | POA: Diagnosis not present

## 2023-04-27 DIAGNOSIS — I619 Nontraumatic intracerebral hemorrhage, unspecified: Secondary | ICD-10-CM | POA: Diagnosis not present

## 2023-04-27 DIAGNOSIS — J9601 Acute respiratory failure with hypoxia: Secondary | ICD-10-CM | POA: Diagnosis not present

## 2023-04-27 LAB — BASIC METABOLIC PANEL
Anion gap: 10 (ref 5–15)
BUN: 23 mg/dL (ref 8–23)
CO2: 23 mmol/L (ref 22–32)
Calcium: 9.1 mg/dL (ref 8.9–10.3)
Chloride: 107 mmol/L (ref 98–111)
Creatinine, Ser: 0.56 mg/dL (ref 0.44–1.00)
GFR, Estimated: >60 mL/min (ref >60)
Glucose, Bld: 125 mg/dL — ABNORMAL HIGH (ref 70–99)
Potassium: 4.2 mmol/L (ref 3.5–5.1)
Sodium: 140 mmol/L (ref 135–145)

## 2023-04-27 LAB — GLUCOSE, CAPILLARY
Glucose-Capillary: 117 mg/dL — ABNORMAL HIGH (ref 70–99)
Glucose-Capillary: 118 mg/dL — ABNORMAL HIGH (ref 70–99)
Glucose-Capillary: 136 mg/dL — ABNORMAL HIGH (ref 70–99)
Glucose-Capillary: 143 mg/dL — ABNORMAL HIGH (ref 70–99)
Glucose-Capillary: 159 mg/dL — ABNORMAL HIGH (ref 70–99)

## 2023-04-27 LAB — BRAIN NATRIURETIC PEPTIDE: B Natriuretic Peptide: 322.1 pg/mL — ABNORMAL HIGH (ref 0.0–100.0)

## 2023-04-27 MED ORDER — MIDAZOLAM HCL 2 MG/2ML IJ SOLN
5.0000 mg | Freq: Once | INTRAMUSCULAR | Status: AC
  Administered 2023-04-28: 2 mg via INTRAVENOUS
  Filled 2023-04-27: qty 6

## 2023-04-27 MED ORDER — ROCURONIUM BROMIDE 10 MG/ML (PF) SYRINGE
100.0000 mg | PREFILLED_SYRINGE | Freq: Once | INTRAVENOUS | Status: AC
  Administered 2023-04-28: 50 mg via INTRAVENOUS
  Filled 2023-04-27: qty 10

## 2023-04-27 MED ORDER — ETOMIDATE 2 MG/ML IV SOLN
20.0000 mg | Freq: Once | INTRAVENOUS | Status: AC
  Administered 2023-04-28: 20 mg via INTRAVENOUS
  Filled 2023-04-27: qty 10

## 2023-04-27 MED ORDER — ACETAMINOPHEN 160 MG/5ML PO SOLN
650.0000 mg | ORAL | Status: DC | PRN
  Administered 2023-04-27 – 2023-04-30 (×5): 650 mg

## 2023-04-27 MED ORDER — LIDOCAINE-EPINEPHRINE 1 %-1:100000 IJ SOLN
20.0000 mL | Freq: Once | INTRAMUSCULAR | Status: AC
  Administered 2023-04-28: 20 mL via INTRADERMAL
  Filled 2023-04-27: qty 1

## 2023-04-27 MED ORDER — FENTANYL CITRATE PF 50 MCG/ML IJ SOSY: 200.0000 ug | PREFILLED_SYRINGE | Freq: Once | INTRAMUSCULAR | Status: DC

## 2023-04-27 NOTE — Progress Notes (Signed)
   Providing Compassionate, Quality Care - Together  NEUROSURGERY PROGRESS NOTE   S: No issues overnight.   O: EXAM:  BP 139/79   Pulse 93   Temp (!) 101.8 F (38.8 C) (Axillary)   Resp (!) 34   Ht 5\' 2"  (1.575 m)   Wt 81.9 kg   SpO2 97%   BMI 33.02 kg/m   Intubated, sedated Eyes closed PERRL Withdrawals x 4 Grimaces to pain  ASSESSMENT:  82 y.o. 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  Please do not hesitate to call with questions or concerns.   Monia Pouch, DO Neurosurgeon [facility] [phone]

## 2023-04-27 NOTE — Progress Notes (Signed)
Attempted to obtain sputum sample. Patient unable to produce sputum at this time. Will keep attempting until we have enough to send to lab.

## 2023-04-27 NOTE — Progress Notes (Signed)
NAME:  Wendy Fleming, MRN:  [id number], DOB:  1940-11-27, LOS: 15 ADMISSION DATE:  04/09/2023, CONSULTATION DATE:  03/27/2023 REFERRING MD:  NSGY, CHIEF COMPLAINT:  AMS   History of Present Illness:  82 year old woman who presented to [facility] ED 8/30 after husband found her down minimally responsive.  Vomited multiple times en route to ER.  Intubated for airway protection in ER.  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  NSGY to place EVD and determine best method of securing aneurysm.  PCCM to consult for vent management.  Hunt Hess 3/4 mFS 2 (thin, +IVH)  Pertinent Medical History:  HTN Anemia  Significant Hospital Events: Including procedures, antibiotic start and stop dates in addition to other pertinent events   8/30 EVD placement in ER, admit Coil embolization Left Acomm aneurysm  8/31 Tolerating SBT, able to follow simple commands  9/1 intermittent episodes of agitation overnight, bilateral wrist restraints added, extubated 9/2 TCD remaining neg for vasospasm 9/4 Waxing/waning mental status, at times somnolent. EVD functioning well. 9/5 More somnolent, NSGY notified. EVD functioning. Na 129 (135). Foley placed for strict I&Os while correcting sodium, HTS 3% started. Febrile to 101.50F, PCT negative. LTM EEG per NSGY. 9/6 HTS 3% ongoing, Na corrected to 136.  9/9 Fevers/leukocytosis > re-intubated for PNA, airway protection 9/11 Intermittently following commands, no further fevers. 9/12 Off sedation this morning, mild tachypnea. Following commands but very weak failed SBT after 4 hours 9/13 tolerated SBT in a.m. but became significantly tachypneic and tachycardic after working with PT 9/14 remains tachypneic with increased respiratory effort this a.m. when sedation lightened.  Febrile overnight with Tmax of 101.8  Interim History / Subjective:  Family at bedside and updated  Objective:  Blood pressure (!) 152/89, pulse 80, temperature 99.4 F (37.4 C),  temperature source Axillary, resp. rate (!) 29, height 5\' 2"  (1.575 m), weight 81.9 kg, SpO2 100%.    Vent Mode: PRVC FiO2 (%):  [30 %] 30 % Set Rate:  [18 bmp] 18 bmp Vt Set:  [400 mL] 400 mL PEEP:  [5 cmH20] 5 cmH20 Pressure Support:  [12 cmH20] 12 cmH20 Plateau Pressure:  [22 cmH20-31 cmH20] 22 cmH20   Intake/Output Summary (Last 24 hours) at 04/27/2023 0723 Last data filed at 04/27/2023 0700 Gross per 24 hour  Intake 1869.2 ml  Output 1501 ml  Net 368.2 ml   Filed Weights   04/25/23 0500 04/26/23 0500 04/27/23 0500  Weight: 84.4 kg 82 kg 81.9 kg   Physical Examination: General: Acute on chronic ill-appearing elderly female lying in bed on mechanical ventilation no acute distress HEENT: ETT, MM pink/moist, PERRL, EVD remains in place Neuro: Will open eyes on ventilator but unable to follow commands today CV: s1s2 regular rate and rhythm, no murmur, rubs, or gallops,  PULM: Mild increased work of breathing, tachypnea, tolerating ventilator GI: soft, bowel sounds active in all 4 quadrants, non-tender, non-distended Extremities: warm/dry, no edema  Skin: no rashes or lesions  Resolved Hospital Problem List:   N/A  Assessment & Plan:  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 CT Head 9/2 shows slight improved ventriculomegaly, cytotoxic edema of anterior left basal ganglia consistent with ischemic infarct. Required HTS 3% 9/5 - 9/7. P: Primary management per neurosurgery EVD per neurosurgery Continue AEDs and Nimotop TCD per protocol PT/OT/SLP as able  Acute hypoxemic respiratory failure due to Aspiration pneumonia  -Extubated 9/1 CXR stable 9/8. PCT negative decompensated  9/9 with fevers and new leukocytosis > presumably recurrent aspiration vs HCAP resulting in reintubation  -Newly febrile with Tmax 101.8 overnight, concern for possible evolving recurrent HCAP P: Family wishes to proceed with  tracheostomy, will coordinate with team for timing Continue ventilator support with lung protective strategies  Wean PEEP and FiO2 for sats greater than 90%. Head of bed elevated 30 degrees. Plateau pressures less than 30 cm H20.  Follow intermittent chest x-ray and ABG.   SAT/SBT as tolerated, mentation preclude extubation  Ensure adequate pulmonary hygiene  Follow cultures  VAP bundle in place  PAD protocol  Hyponatremia, improving -CSW versus ?SIADH, hypertonic saline stopped 9/13 Hypophosphatemia, improved Hypokalemia, improved -Osm 282, urine Osm 765. P: Trend bemet  Dysphagia -Evaluated by SLP.  P: Continue tube feeds per cortrack SLP as able RD following  Acute urinary retention -S/p multiple I&O caths. P: Continue Foley  GOC As of 9/14 family has decided to proceed with tracheostomy tube once medically appropriate.   Best Practice: (right click and "Reselect all SmartList Selections" daily)   Diet/type: NPO, tube feeds DVT prophylaxis: SCD, [place] heparin started 9/1 GI prophylaxis: PPI Lines: N/A EVD Foley:  N/A Code Status:  full code Last date of multidisciplinary goals of care discussion [Per Primary]  Critical care time:  CRITICAL CARE Performed by: Ellene Bloodsaw D. Harris   Total critical care time: 40 minutes  Critical care time was exclusive of separately billable procedures and treating other patients.  Critical care was necessary to treat or prevent imminent or life-threatening deterioration.  Critical care was time spent personally by me on the following activities: development of treatment plan with patient and/or surrogate as well as nursing, discussions with consultants, evaluation of patient's response to treatment, examination of patient, obtaining history from patient or surrogate, ordering and performing treatments and interventions, ordering and review of laboratory studies, ordering and review of radiographic studies, pulse oximetry and  re-evaluation of patient's condition.   Zohar Maroney D. Harris, NP-C [facility] Personal contact information can be found on Amion  If no contact or response made please call [phone] 04/27/2023, 7:23 AM

## 2023-04-27 NOTE — Plan of Care (Signed)
  Problem: Health Behavior/Discharge Planning: Goal: Goals will be collaboratively established with patient/family Outcome: Progressing   Problem: Nutrition: Goal: Dietary intake will improve Outcome: Progressing   Problem: Activity: Goal: Ability to tolerate increased activity will improve Outcome: Progressing   Problem: Respiratory: Goal: Ability to maintain a clear airway and adequate ventilation will improve Outcome: Progressing   Problem: Education: Goal: Knowledge of disease or condition will improve Outcome: Not Progressing Goal: Knowledge of secondary prevention will improve (MUST DOCUMENT ALL) Outcome: Not Progressing Goal: Knowledge of patient specific risk factors will improve [name] N/A or DELETE if not current risk factor) Outcome: Not Progressing   Problem: Spontaneous Subarachnoid Hemorrhage Tissue Perfusion: Goal: Complications of Spontaneous Subarachnoid Hemorrhage will be minimized Outcome: Not Progressing   Problem: Coping: Goal: Will verbalize positive feelings about self Outcome: Not Progressing Goal: Will identify appropriate support needs Outcome: Not Progressing   Problem: Health Behavior/Discharge Planning: Goal: Ability to manage health-related needs will improve Outcome: Not Progressing   Problem: Self-Care: Goal: Ability to participate in self-care as condition permits will improve Outcome: Not Progressing Goal: Verbalization of feelings and concerns over difficulty with self-care will improve Outcome: Not Progressing Goal: Ability to communicate needs accurately will improve Outcome: Not Progressing   Problem: Nutrition: Goal: Risk of aspiration will decrease Outcome: Not Progressing   Problem: Role Relationship: Goal: Method of communication will improve Outcome: Not Progressing   Problem: Safety: Goal: Non-violent Restraint(s) Outcome: Completed/Met

## 2023-04-28 ENCOUNTER — Inpatient Hospital Stay (HOSPITAL_COMMUNITY): Payer: Medicare HMO

## 2023-04-28 DIAGNOSIS — I619 Nontraumatic intracerebral hemorrhage, unspecified: Secondary | ICD-10-CM | POA: Diagnosis not present

## 2023-04-28 DIAGNOSIS — R4182 Altered mental status, unspecified: Secondary | ICD-10-CM | POA: Diagnosis not present

## 2023-04-28 DIAGNOSIS — J9601 Acute respiratory failure with hypoxia: Secondary | ICD-10-CM | POA: Diagnosis not present

## 2023-04-28 LAB — CBC
HCT: 33 % — ABNORMAL LOW (ref 36.0–46.0)
Hemoglobin: 10.1 g/dL — ABNORMAL LOW (ref 12.0–15.0)
MCH: 30 pg (ref 26.0–34.0)
MCHC: 30.6 g/dL (ref 30.0–36.0)
MCV: 97.9 fL (ref 80.0–100.0)
Platelets: 273 10*3/uL (ref 150–400)
RBC: 3.37 MIL/uL — ABNORMAL LOW (ref 3.87–5.11)
RDW: 14.7 % (ref 11.5–15.5)
WBC: 9.1 10*3/uL (ref 4.0–10.5)
nRBC: 0 % (ref 0.0–0.2)

## 2023-04-28 LAB — GLUCOSE, CAPILLARY
Glucose-Capillary: 131 mg/dL — ABNORMAL HIGH (ref 70–99)
Glucose-Capillary: 121 mg/dL — ABNORMAL HIGH (ref 70–99)
Glucose-Capillary: 122 mg/dL — ABNORMAL HIGH (ref 70–99)
Glucose-Capillary: 124 mg/dL — ABNORMAL HIGH (ref 70–99)
Glucose-Capillary: 98 mg/dL (ref 70–99)
Glucose-Capillary: 126 mg/dL — ABNORMAL HIGH (ref 70–99)

## 2023-04-28 MED ORDER — HEPARIN SODIUM (PORCINE) 5000 UNIT/ML IJ SOLN
5000.0000 [IU] | Freq: Three times a day (TID) | INTRAMUSCULAR | Status: DC
  Administered 2023-04-28 – 2023-04-30 (×6): 5000 [IU] via SUBCUTANEOUS
  Filled 2023-04-28 (×6): qty 1

## 2023-04-28 MED ORDER — PIPERACILLIN-TAZOBACTAM 3.375 G IVPB
3.3750 g | Freq: Three times a day (TID) | INTRAVENOUS | Status: DC
  Administered 2023-04-28 – 2023-04-30 (×6): 3.375 g via INTRAVENOUS
  Filled 2023-04-28 (×6): qty 50

## 2023-04-28 MED ORDER — ROCURONIUM BROMIDE 10 MG/ML (PF) SYRINGE
PREFILLED_SYRINGE | INTRAVENOUS | Status: AC
  Filled 2023-04-28: qty 10

## 2023-04-28 MED ORDER — PHENYLEPHRINE HCL-NACL 20-0.9 MG/250ML-% IV SOLN
INTRAVENOUS | Status: AC
  Filled 2023-04-28: qty 250

## 2023-04-28 NOTE — Consult Note (Signed)
Consultation Note Date: 04/28/2023   [name]  DOB: [birth year]  MRN: [id number]  Age / Sex: 82 y.o., female  PCP: Juluis Rainier, MD (Inactive) Referring Physician: Lisbeth Renshaw, MD  Reason for Consultation: Establishing goals of care  HPI/Patient Profile: 82 y.o. female admitted to [facility] 8/30 with ruptured Acomm who is s/p EVD by neurosurgery.  Hospital course has been complicated by respiratory failure with aspiration pneumonia.  She has been unable to be weaned from mechanical ventilation and consideration for tracheostomy.  Clinical Assessment and Goals of Care: I saw and examined Wendy Fleming.  She is intubated and was sleepy bu somewhat arousable during exam.  She was not awake to the point, however, of being able to meaningfully participate in conversation regarding goals of care.  Family is therefore surrogate on her behalf including her husband.  Family discussion is that they remain invested in any and all offered interventions.  Family remains hopeful for her recovery.   SUMMARY OF RECOMMENDATIONS  - Full code/full scope - Family has elected to proceed with tracheostomy - Palliative care will continue to follow peripherally but not round on patient daily.  Please call for any specific needs with which we can be of assistance.  Code Status/Advance Care Planning: Full code  Psycho-social/Spiritual:  Desire for further Chaplaincy support:Not addressed today Additional Recommendations: Caregiving  Support/Resources  Prognosis:  Unable to determine  Discharge Planning:  Will likely need long term vent care.       Primary Diagnoses: Present on Admission:  ICH (intracerebral hemorrhage) (HCC)   I have reviewed the medical record, interviewed the patient and family, and examined the patient. The following aspects are pertinent.  Past Medical History:  Diagnosis  Date   Anemia    many years ago   Arthritis    Headache    rare   Hypertension    Social History   Socioeconomic History   Marital status: Married    Spouse name: Not on file   Number of children: Not on file   Years of education: Not on file   Highest education level: Not on file  Occupational History   Not on file  Tobacco Use   Smoking status: Former    Current packs/day: 0.00    Average packs/day: 0.3 packs/day for 10.0 years (2.5 ttl pk-yrs)    Types: Cigarettes    Start date: 06/07/1999    Quit date: 06/06/2009    Years since quitting: 13.9   Smokeless tobacco: Never  Substance and Sexual Activity   Alcohol use: Yes    Comment: very rare   Drug use: No   Sexual activity: Never  Other Topics Concern   Not on file  Social History Narrative   Not on file   Social Determinants of Health   Financial Resource Strain: Not on file  Food Insecurity: Not on file  Transportation Needs: Not on file  Physical Activity: Not on file  Stress: Not on file  Social Connections: Not on file  Family History  Problem Relation Age of Onset   Breast cancer Maternal Aunt    Breast cancer Cousin    Breast cancer Sister    Scheduled Meds:  bethanechol  10 mg Per Tube TID   Chlorhexidine Gluconate Cloth  6 each Topical Daily   docusate  100 mg Per Tube BID   feeding supplement (PROSource TF20)  60 mL Per Tube Daily   fentaNYL (SUBLIMAZE) injection  200 mcg Intravenous Once   fiber supplement (BANATROL TF)  60 mL Per Tube BID   heparin  5,000 Units Subcutaneous Q8H   multivitamin with minerals  1 tablet Per Tube Daily   niMODipine  60 mg Oral Q4H   Or   niMODipine  60 mg Per Tube Q4H   mouth rinse  15 mL Mouth Rinse Q2H   pantoprazole  40 mg Oral Daily   Or   pantoprazole (PROTONIX) IV  40 mg Intravenous Daily   phenylephrine       polyethylene glycol  17 g Per Tube Daily   rocuronium bromide       sodium chloride flush  10-40 mL Intracatheter Q12H   Continuous  Infusions:  sodium chloride     sodium chloride Stopped (04/18/23 1050)   dexmedetomidine (PRECEDEX) IV infusion 0.5 mcg/kg/hr (04/28/23 0700)   feeding supplement (OSMOLITE 1.5 CAL) Stopped (04/28/23 0005)   fentaNYL infusion INTRAVENOUS 75 mcg/hr (04/28/23 0700)   piperacillin-tazobactam (ZOSYN)  IV     PRN Meds:.Place/Maintain arterial line **AND** sodium chloride, acetaminophen **OR** acetaminophen (TYLENOL) oral liquid 160 mg/5 mL **OR** acetaminophen, acetaminophen (TYLENOL) oral liquid 160 mg/5 mL, fentaNYL, hydrALAZINE, midazolam, ondansetron **OR** ondansetron (ZOFRAN) IV, mouth rinse, mouth rinse, phenylephrine, rocuronium bromide, sodium chloride flush Medications Prior to Admission:  Prior to Admission medications   Medication Sig Start Date End Date Taking? Authorizing Provider  acetaminophen (TYLENOL) 325 MG tablet Take 650 mg by mouth every 6 (six) hours as needed (pain).   Yes [provider]  cholecalciferol (VITAMIN D) 1000 UNITS tablet Take 1,000 Units by mouth every morning.    Yes [provider]  Glucosamine HCl (GLUCOSAMINE PO) Take by mouth.   Yes [provider]  Multiple Vitamin (MULTIVITAMIN WITH MINERALS) TABS Take 1 tablet by mouth every morning.    Yes [provider]  Naproxen Sodium (ALEVE) 220 MG CAPS Take 1 capsule by mouth once as needed (pain.).   Yes [provider]  trandolapril (MAVIK) 4 MG tablet Take 4 mg by mouth every morning.    Yes [provider]  PRESCRIPTION MEDICATION Apply 1 application topically 2 (two) times a week. Compounded cream for ankle pain. Patient not taking: Reported on 03/24/2023    [provider]   Allergies  Allergen Reactions   Erythromycin Swelling    Other Reaction(s): rash/swelling   Penicillins Hives    Other Reaction(s): rash/swelling   Streptomycin Swelling    Other Reaction(s): rash/swelling   Sulfa Antibiotics Nausea And Vomiting   Tetracycline Hcl      Other Reaction(s): rash/swelling   Tetracyclines & Related Swelling   Review of Systems Unable to obtain  Physical Exam General: Acute on chronically ill appearing elderly female lying in bed on mechanical ventilation, in NAD HEENT: ETT, MM pink/moist, PERRL,  Neuro: Minimally responsive, little interaction CV: regular rate and rhythm, no murmur, rubs, or gallops,  PULM: Clear to auscultation bilaterally, no increased work of breathing, no added breath sounds GI: soft, ,non-distended Extremities: warm/dry, no edema  Skin:  no rashes or lesions  Vital Signs: BP 138/81   Pulse (!) 107   Temp 99.2 F (37.3 C) (Axillary)   Resp (!) 21   Ht 5\' 2"  (1.575 m)   Wt 86.1 kg   SpO2 (!) 86%   BMI 34.72 kg/m  Pain Scale: CPOT   Pain Score: Asleep   SpO2: SpO2: (!) 86 % O2 Device:SpO2: (!) 86 % O2 Flow Rate: .O2 Flow Rate (L/min): 3 L/min  IO: Intake/output summary:  Intake/Output Summary (Last 24 hours) at 04/28/2023 1338 Last data filed at 04/28/2023 1300 Gross per 24 hour  Intake 1120.05 ml  Output 1664 ml  Net -543.95 ml    LBM: Last BM Date : 04/27/23 Baseline Weight: Weight: 90.2 kg Most recent weight: Weight: 86.1 kg     Palliative Assessment/Data:     I spent a total of 55 minutes providing patient's care.  Includes review of EMR, discussing care with other staff members involved in patient's medical care, obtaining relevant history and information from patient and/or patient's family, and personal review of imaging and lab work. Greater than 50% of the time was spent counseling and coordinating care related to the above assessment and plan.     Signed by: Romie Minus, MD   Please contact Palliative Medicine Team phone at [phone] for questions and concerns.  For individual provider: See Loretha Stapler

## 2023-04-28 NOTE — Progress Notes (Signed)
RT NOTE: patient was placed back on full support ventilator settings due to tracheostomy performed.  Tolerating well at this time.  Will continue to monitor.

## 2023-04-28 NOTE — Procedures (Signed)
Percutaneous Tracheostomy Procedure Note   Wendy Fleming  [id number]  [birth year]  Date:04/28/23  Time:11:23 AM   Provider Performing:Castle Lamons C Katrinka Blazing  Procedure: Percutaneous Tracheostomy with Bronchoscopic Guidance (66440)  Indication(s) Persistent resp failure  Consent Risks of the procedure as well as the alternatives and risks of each were explained to the patient and/or caregiver.  Consent for the procedure was obtained.  Anesthesia Etomidate, Versed, Fentanyl, Vecuronium   Time Out Verified patient identification, verified procedure, site/side was marked, verified correct patient position, special equipment/implants available, medications/allergies/relevant history reviewed, required imaging and test results available.   Sterile Technique Maximal sterile technique including sterile barrier drape, hand hygiene, sterile gown, sterile gloves, mask, hair covering.    Procedure Description Appropriate anatomy identified by palpation.  Patient's neck prepped and draped in sterile fashion.  1% lidocaine with epinephrine was used to anesthetize skin overlying neck.  1.5cm incision made and blunt dissection performed until tracheal rings could be easily palpated.   Then a size 6-0 Shiley tracheostomy was placed under bronchoscopic visualization using usual Seldinger technique and serial dilation.   Bronchoscope confirmed placement above the carina.  Tracheostomy was sutured in place with adhesive pad to protect skin under pressure.    Patient connected to ventilator.   Complications/Tolerance None; patient tolerated the procedure well. Chest X-ray is ordered to confirm no post-procedural complication.   EBL Minimal   Specimen(s) None

## 2023-04-28 NOTE — Progress Notes (Signed)
   Providing Compassionate, Quality Care - Together  NEUROSURGERY PROGRESS NOTE   S: No issues overnight.   O: EXAM:  BP 92/64   Pulse 72   Temp 98.7 F (37.1 C) (Axillary)   Resp (!) 22   Ht 5\' 2"  (1.575 m)   Wt 86.1 kg   SpO2 99%   BMI 34.72 kg/m   Intubated, sedated Eyes closed PERRL Withdrawals x 4 Grimaces to pain EVD draining well, open to drain at 10   ASSESSMENT:  82 y.o. 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  Please do not hesitate to call with questions or concerns.   Monia Pouch, DO Neurosurgeon [facility] [phone]

## 2023-04-28 NOTE — Progress Notes (Signed)
NAME:  Wendy Fleming, MRN:  [id number], DOB:  Apr 07, 1941, LOS: 16 ADMISSION DATE:  03/31/2023, CONSULTATION DATE:  03/20/2023 REFERRING MD:  NSGY, CHIEF COMPLAINT:  AMS   History of Present Illness:  82 year old woman who presented to [facility] ED 8/30 after husband found her down minimally responsive.  Vomited multiple times en route to ER.  Intubated for airway protection in ER.  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  NSGY to place EVD and determine best method of securing aneurysm.  PCCM to consult for vent management.  [facility] 3/4 mFS 2 (thin, +IVH)  Pertinent Medical History:  HTN Anemia  Significant Hospital Events: Including procedures, antibiotic start and stop dates in addition to other pertinent events   8/30 EVD placement in ER, admit Coil embolization Left Acomm aneurysm  8/31 Tolerating SBT, able to follow simple commands  9/1 intermittent episodes of agitation overnight, bilateral wrist restraints added, extubated 9/2 TCD remaining neg for vasospasm 9/4 Waxing/waning mental status, at times somnolent. EVD functioning well. 9/5 More somnolent, NSGY notified. EVD functioning. Na 129 (135). Foley placed for strict I&Os while correcting sodium, HTS 3% started. Febrile to 101.52F, PCT negative. LTM EEG per NSGY. 9/6 HTS 3% ongoing, Na corrected to 136.  9/9 Fevers/leukocytosis > re-intubated for PNA, airway protection 9/11 Intermittently following commands, no further fevers. 9/12 Off sedation this morning, mild tachypnea. Following commands but very weak failed SBT after 4 hours 9/13 tolerated SBT in a.m. but became significantly tachypneic and tachycardic after working with PT 9/14 remains tachypneic with increased respiratory effort this a.m. when sedation lightened.  Febrile overnight with Tmax of 101.8 9/15 Plans for placement of bedside trach this afternoon   Interim History / Subjective:  Family at bedside and updated   Objective:  Blood  pressure 92/64, pulse 72, temperature 98.7 F (37.1 C), temperature source Axillary, resp. rate (!) 22, height 5\' 2"  (1.575 m), weight 86.1 kg, SpO2 99%.    Vent Mode: PSV;CPAP FiO2 (%):  [30 %] 30 % Set Rate:  [18 bmp] 18 bmp Vt Set:  [400 mL] 400 mL PEEP:  [5 cmH20] 5 cmH20 Pressure Support:  [10 cmH20-12 cmH20] 10 cmH20 Plateau Pressure:  [9 cmH20] 9 cmH20   Intake/Output Summary (Last 24 hours) at 04/28/2023 0951 Last data filed at 04/28/2023 0950 Gross per 24 hour  Intake 1120.05 ml  Output 1938 ml  Net -817.95 ml   Filed Weights   04/26/23 0500 04/27/23 0500 04/28/23 0500  Weight: 82 kg 81.9 kg 86.1 kg   Physical Examination: General: Acute on chronically ill appearing elderly female lying in bed on mechanical ventilation, in NAD HEENT: ETT, MM pink/moist, PERRL,  Neuro: Sedated on vent CV: s1s2 regular rate and rhythm, no murmur, rubs, or gallops,  PULM: Clear to auscultation bilaterally, no increased work of breathing, no added breath sounds GI: soft, bowel sounds active in all 4 quadrants, non-tender, non-distended, tolerating TF Extremities: warm/dry, no edema  Skin: no rashes or lesions  Resolved Hospital Problem List:   Hyponatremia, improving -CSW versus ?SIADH, hypertonic saline stopped 9/13 Hypophosphatemia Hypokalemia   Assessment & Plan:  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 CT Head 9/2 shows slight improved ventriculomegaly, cytotoxic edema of anterior left basal ganglia consistent with ischemic infarct. Required HTS 3% 9/5 - 9/7. P: Primary management per neurosurgery EVD per neurosurgery AEDs  PT/OT/SLP as able  Acute hypoxemic respiratory failure due  to Aspiration pneumonia  -Extubated 9/1 CXR stable 9/8. PCT negative decompensated 9/9 with fevers and new leukocytosis > presumably recurrent aspiration vs HCAP resulting in reintubation  -9/14 Newly febrile with Tmax  101.8 overnight, concern for possible evolving recurrent HCAP P: Continue ventilator support with lung protective strategies  Wean PEEP and FiO2 for sats greater than 90%. Head of bed elevated 30 degrees. Plateau pressures less than 30 cm H20.  Follow intermittent chest x-ray and ABG.   SAT/SBT as tolerated, mentation preclude extubation  Ensure adequate pulmonary hygiene  Follow cultures  VAP bundle in place  PAD protocol Zosyn started 9/15 with GNR in sputum  Plans for bedside tracheostomy 9/15  Dysphagia -Evaluated by SLP.  P: Continue tube feeds SLP as able   Acute urinary retention -S/p multiple I&O caths. P: Foley  GOC As of 9/14 family has decided to proceed with tracheostomy tube once medically appropriate.   Best Practice: (right click and "Reselect all SmartList Selections" daily)   Diet/type: NPO, tube feeds DVT prophylaxis: SCD, [place] heparin started 9/1 GI prophylaxis: PPI Lines: N/A EVD Foley:  N/A Code Status:  full code Last date of multidisciplinary goals of care discussion [Per Primary]  Critical care time:  CRITICAL CARE Performed by: Yao Hyppolite D. Harris   Total critical care time: 38 minutes  Critical care time was exclusive of separately billable procedures and treating other patients.  Critical care was necessary to treat or prevent imminent or life-threatening deterioration.  Critical care was time spent personally by me on the following activities: development of treatment plan with patient and/or surrogate as well as nursing, discussions with consultants, evaluation of patient's response to treatment, examination of patient, obtaining history from patient or surrogate, ordering and performing treatments and interventions, ordering and review of laboratory studies, ordering and review of radiographic studies, pulse oximetry and re-evaluation of patient's condition.   Chaya Dehaan D. Harris, NP-C [facility] Personal contact  information can be found on Amion  If no contact or response made please call [phone] 04/28/2023, 9:51 AM

## 2023-04-28 NOTE — Procedures (Signed)
Bedside Tracheostomy Insertion Procedure Note   Patient Details:   Name: Wendy Fleming DOB: Mar 03, 1941 MRN: [id number]  Procedure: Tracheostomy  Pre Procedure Assessment: ET Tube Size: 7.5 ET Tube secured at lip (cm): 23 Bite block in place: No Breath Sounds: Diminished  Post Procedure Assessment: BP (!) 154/77   Pulse 97   Temp 98.7 F (37.1 C) (Axillary)   Resp 20   Ht 5\' 2"  (1.575 m)   Wt 86.1 kg   SpO2 100%   BMI 34.72 kg/m  O2 sats: stable throughout Complications: No apparent complications Patient did tolerate procedure well Tracheostomy Brand:Shiley Tracheostomy Style:Cuffed Tracheostomy Size:  6 Tracheostomy Secured MVH:QIONGEX & velcro Tracheostomy Placement Confirmation:Trach cuff visualized and in place and Chest X ray ordered for placement    Jacqulynn Cadet 04/28/2023, 11:19 AM

## 2023-04-28 NOTE — Procedures (Signed)
Diagnostic Bronchoscopy  Wendy Fleming  [id number]  [birth year]  Date:04/28/23  Time:11:54 AM   Provider Performing:Arasely Akkerman B [name]   Procedure: Diagnostic Bronchoscopy (40981)  Indication(s) Assist with direct visualization of tracheostomy placement  Consent Risks of the procedure as well as the alternatives and risks of each were explained to the patient and/or caregiver.  Consent for the procedure was obtained.   Anesthesia See separate tracheostomy note   Time Out Verified patient identification, verified procedure, site/side was marked, verified correct patient position, special equipment/implants available, medications/allergies/relevant history reviewed, required imaging and test results available.   Sterile Technique Usual hand hygiene, masks, gowns, and gloves were used   Procedure Description Bronchoscope advanced through endotracheal tube and into airway.  After suctioning out tracheal secretions, bronchoscope used to provide direct visualization of tracheostomy placement.   Complications/Tolerance None; patient tolerated the procedure well.   EBL None  Specimen(s) None

## 2023-04-28 NOTE — Progress Notes (Signed)
SLP Cancellation Note  Patient Details Name: Wendy Fleming MRN: [id number] DOB: 07-25-1941   Cancelled treatment:       Reason Eval/Treat Not Completed: Patient with new tracheostomy. Orders for SLP eval and treat for PMSV and swallowing received. Will follow pt closely for readiness for SLP interventions as appropriate.     Gwynneth Aliment, M.A., CF-SLP Speech Language Pathology, Acute Rehabilitation Services  Secure Chat preferred [phone]  04/28/2023, 11:29 AM

## 2023-04-28 NOTE — Progress Notes (Signed)
RT NOTE: patient placed on CPAP/PSV of 10/5 at 0742.  Looks more comfortable on CPAP/PSV.  Will leave on CPAP/PSV and continue to wean as tolerated.

## 2023-04-29 ENCOUNTER — Other Ambulatory Visit (HOSPITAL_COMMUNITY): Payer: Medicare HMO

## 2023-04-29 DIAGNOSIS — I609 Nontraumatic subarachnoid hemorrhage, unspecified: Secondary | ICD-10-CM | POA: Diagnosis not present

## 2023-04-29 DIAGNOSIS — J9601 Acute respiratory failure with hypoxia: Secondary | ICD-10-CM | POA: Diagnosis not present

## 2023-04-29 LAB — CBC
HCT: 34.4 % — ABNORMAL LOW (ref 36.0–46.0)
Hemoglobin: 10.4 g/dL — ABNORMAL LOW (ref 12.0–15.0)
MCH: 29.2 pg (ref 26.0–34.0)
MCHC: 30.2 g/dL (ref 30.0–36.0)
MCV: 96.6 fL (ref 80.0–100.0)
Platelets: 344 10*3/uL (ref 150–400)
RBC: 3.56 MIL/uL — ABNORMAL LOW (ref 3.87–5.11)
RDW: 14.6 % (ref 11.5–15.5)
WBC: 11 10*3/uL — ABNORMAL HIGH (ref 4.0–10.5)
nRBC: 0 % (ref 0.0–0.2)

## 2023-04-29 LAB — GLUCOSE, CAPILLARY
Glucose-Capillary: 105 mg/dL — ABNORMAL HIGH (ref 70–99)
Glucose-Capillary: 129 mg/dL — ABNORMAL HIGH (ref 70–99)
Glucose-Capillary: 130 mg/dL — ABNORMAL HIGH (ref 70–99)
Glucose-Capillary: 131 mg/dL — ABNORMAL HIGH (ref 70–99)
Glucose-Capillary: 135 mg/dL — ABNORMAL HIGH (ref 70–99)
Glucose-Capillary: 138 mg/dL — ABNORMAL HIGH (ref 70–99)

## 2023-04-29 LAB — BASIC METABOLIC PANEL
Anion gap: 14 (ref 5–15)
BUN: 21 mg/dL (ref 8–23)
CO2: 22 mmol/L (ref 22–32)
Calcium: 8.7 mg/dL — ABNORMAL LOW (ref 8.9–10.3)
Chloride: 102 mmol/L (ref 98–111)
Creatinine, Ser: 0.79 mg/dL (ref 0.44–1.00)
GFR, Estimated: >60 mL/min (ref >60)
Glucose, Bld: 158 mg/dL — ABNORMAL HIGH (ref 70–99)
Potassium: 3.5 mmol/L (ref 3.5–5.1)
Sodium: 138 mmol/L (ref 135–145)

## 2023-04-29 NOTE — Progress Notes (Signed)
  NEUROSURGERY PROGRESS NOTE   Pt seen and examined. No issues overnight, trached yesterday. Has been off sedation since this am.   EXAM: Temp:  [98 F (36.7 C)-100.9 F (38.3 C)] 100.3 F (37.9 C) (09/16 1600) Pulse Rate:  [71-100] 91 (09/16 1700) Resp:  [18-33] 33 (09/16 1700) BP: (112-151)/(54-95) 134/73 (09/16 1700) SpO2:  [95 %-100 %] 100 % (09/16 1700) FiO2 (%):  [35 %-40 %] 35 % (09/16 1537) Weight:  [85 kg] 85 kg (09/16 0500) Intake/Output      09/15 0701 09/16 0700 09/16 0701 09/17 0700   I.V. (mL/kg) 171.2 (2) 17.5 (0.2)   NG/GT 702.7 434.7   IV Piggyback 100 71.3   Total Intake(mL/kg) 973.9 (11.5) 523.4 (6.2)   Urine (mL/kg/hr) 1275 (0.6) 800 (0.9)   Emesis/NG output 175    Drains 141 55   Stool 525 750   Total Output 2116 1605   Net -1142.1 -1081.6         Eyes open spontaneously Breathing spontaneously on trach collar CN grossly intact Not reliably following commands, does move R > L spontaneously Generalized weakness EVD in place, patent @ with bloody CSF  LABS: Lab Results  Component Value Date   CREATININE 0.79 04/29/2023   BUN 21 04/29/2023   NA 138 04/29/2023   K 3.5 04/29/2023   CL 102 04/29/2023   CO2 22 04/29/2023   Lab Results  Component Value Date   WBC 11.0 (H) 04/29/2023   HGB 10.4 (L) 04/29/2023   HCT 34.4 (L) 04/29/2023   MCV 96.6 04/29/2023   PLT 344 04/29/2023     IMPRESSION: - 82 y.o. female SAH d# 18 s/p coil embolization Acom aneurysm and EVD for associated HCP, neurologically unchanged, appears mostly unresponsive. Although she has some periods of perhaps increased interactivity, she has had a lack of significant progress over past 3 weeks. Furthermore, her ability for general recovery is likely low given her advanced age. While there is the potential for continued neurologic improvement, I suspect that the likelihood of her improving to the point of being able to return home with family support is low, and the  likelihood of her becoming independent is negligible. I think the most likely scenario is long-term care in a nursing facility.  PLAN: - Antipyretics for fever - Cont Nimotop to complete 21d - Clamp EVD today. Based on the impression above, we will need to discuss with family the utility of shunt placement should it be determined that she requires permanent CSF diversion.   Reviewed plan above with family at bedside. All questions were answered.  Lisbeth Renshaw, MD [facility]

## 2023-04-29 NOTE — Progress Notes (Signed)
RT NOTE: patient placed on 35% ATC.  Currently tolerating well.  Will continue to monitor.

## 2023-04-29 NOTE — Progress Notes (Addendum)
Occupational Therapy Treatment Patient Details Name: Wendy Fleming MRN: [id number] DOB: 06/02/1941 Today's Date: 04/29/2023   History of present illness Pt is an 82 y.o. female who presented 04/14/2023 after being found minimally responsive. 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 S/p coil embolization L ACOM aneurysm 04/14/2023. ETT 04/14/23 - 9/1. Re-intubated 9/9. PMH: anemia, arthritis, HTN   OT comments  Patient supine in bed, spouse and daughter at side.  Pt more alert and following commands with increased time today, improved attention to when her daughter voices her name and engages with her.  She is moving both R UE and L UE today, L remains stronger but attempting to reach with R UE for washcloth.  Noted B shoulder stiffness and tremors in all extremities. She has focused gaze but tends to have difficulty sustaining gaze outside of midline. Continues to require total assist for all self care, max-total assist +2 for bed mobility.  Fatigued quickly. Will follow acutely.       If plan is discharge home, recommend the following:  Assist for transportation;Two people to help with walking and/or transfers;Direct supervision/assist for medications management;Direct supervision/assist for financial management;Two people to help with bathing/dressing/bathroom;Assistance with cooking/housework   Equipment Recommendations  Other (comment) (defer)    Recommendations for Other Services      Precautions / Restrictions Precautions Precautions: Fall;Other (comment) Precaution Comments: EVD (clamp for sessions), intubated, flexiseal, foley, watch HR Restrictions Weight Bearing Restrictions: No       Mobility Bed Mobility Overal bed mobility: Needs Assistance Bed Mobility: Supine to Sit, Sit to Supine     Supine to sit: +2 for physical assistance, +2 for safety/equipment, HOB elevated, Max assist Sit to supine: Total assist, +2 for physical  assistance, +2 for safety/equipment, HOB elevated   General bed mobility comments: pt with core activation with transition from supine to sit but completely dependent for return to supine. Pt limited with reaching when cued due to B shoulder pain and stiffness    Transfers                   General transfer comment: unable     Balance Overall balance assessment: Needs assistance Sitting-balance support: Single extremity supported, Bilateral upper extremity supported, Feet supported Sitting balance-Leahy Scale: Poor Sitting balance - Comments: support needed varied from mod A to contact guard A with time up. Initially trying to brace self with LUE but maintained more erect sitting with L hand on L knee. Postural control: Posterior lean, Left lateral lean                                 ADL either performed or assessed with clinical judgement   ADL Overall ADL's : Needs assistance/impaired Eating/Feeding: NPO   Grooming: Wash/dry hands;Wash/dry face;Total assistance;Sitting Grooming Details (indicate cue type and reason): EOB with total assist hand over hand to wash face             Lower Body Dressing: Total assistance;+2 for physical assistance;+2 for safety/equipment;Sitting/lateral leans;Bed level               Functional mobility during ADLs: Total assistance;+2 for physical assistance;+2 for safety/equipment General ADL Comments: Total A at EOB, increased ability to participate noted    Extremity/Trunk Assessment Upper Extremity Assessment Upper Extremity Assessment: RUE deficits/detail;LUE deficits/detail;Right hand dominant RUE Deficits / Details: pt squeezing hand today and attempting to move UE  once sitting at EOB and more alert.  Remains weak on R side, shoulder stiffness noted RUE Sensation: decreased light touch RUE Coordination: decreased fine motor;decreased gross motor LUE Deficits / Details: pt squeezing hand today and attempting to  keep arm elevated when raised by therapist. daughter reports hx of L shoudler pain, and shoulder stiffness noted. LUE Sensation: decreased light touch LUE Coordination: decreased fine motor;decreased gross motor            Vision   Vision Assessment?: Vision impaired- to be further tested in functional context Additional Comments: pt able to focus on therapist and scan to L/R briefly but quickly jumps back to midline   Perception     Praxis      Cognition Arousal: Lethargic Behavior During Therapy: Flat affect (appeared to try to smile a couple times) Overall Cognitive Status: Difficult to assess Area of Impairment: Attention, Following commands, Safety/judgement, Awareness, Problem solving                   Current Attention Level: Focused Memory: Decreased short-term memory Following Commands: Follows one step commands inconsistently, Follows one step commands with increased time Safety/Judgement: Decreased awareness of safety, Decreased awareness of deficits Awareness: Intellectual Problem Solving: Decreased initiation, Slow processing, Difficulty sequencing, Requires verbal cues, Requires tactile cues General Comments: more alert when her name is called, esp when called by her daughter. Follows commands intermittently with LUE and BLE's. She requires increased time to follow commands and is easily distracted.  Orientation NT.        Exercises Exercises: Other exercises Other Exercises Other Exercises: PROM to BUEs in all planes x 5 reps    Shoulder Instructions       General Comments VSS on TC 10L 35%    Pertinent Vitals/ Pain       Pain Assessment Pain Assessment: Faces Faces Pain Scale: Hurts even more Pain Location: B shoulders and R knee with ROM Pain Descriptors / Indicators: Grimacing, Guarding, Tightness Pain Intervention(s): Limited activity within patient's tolerance, Monitored during session, Repositioned  Home Living                                           Prior Functioning/Environment              Frequency  Min 1X/week        Progress Toward Goals  OT Goals(current goals can now be found in the care plan section)  Progress towards OT goals: Progressing toward goals (slowly)  Acute Rehab OT Goals Patient Stated Goal: unable OT Goal Formulation: Patient unable to participate in goal setting Time For Goal Achievement: 05/13/23 Potential to Achieve Goals: Fair ADL Goals Pt Will Perform Grooming: with max assist;sitting Additional ADL Goal #1: Pt will sit EOB for 10 minutes with min guard to increase ADL tolerance. Additional ADL Goal #2: Pt will follow 1 step commands with 75% accuracy given increased time.  Plan      Co-evaluation    PT/OT/SLP Co-Evaluation/Treatment: Yes Reason for Co-Treatment: Complexity of the patient's impairments (multi-system involvement);Necessary to address cognition/behavior during functional activity;For patient/therapist safety;To address functional/ADL transfers PT goals addressed during session: Mobility/safety with mobility;Balance OT goals addressed during session: Strengthening/ROM      AM-PAC OT "6 Clicks" Daily Activity     Outcome Measure   Help from another person eating meals?: Total Help from another person taking  care of personal grooming?: Total Help from another person toileting, which includes using toliet, bedpan, or urinal?: Total Help from another person bathing (including washing, rinsing, drying)?: Total Help from another person to put on and taking off regular upper body clothing?: Total Help from another person to put on and taking off regular lower body clothing?: Total 6 Click Score: 6    End of Session Equipment Utilized During Treatment: Oxygen (TC)  OT Visit Diagnosis: Muscle weakness (generalized) (M62.81);Hemiplegia and hemiparesis;Other symptoms and signs involving cognitive function;Other symptoms and signs involving the  nervous system (R29.898);Other abnormalities of gait and mobility (R26.89) Hemiplegia - Right/Left: Right Hemiplegia - dominant/non-dominant: Dominant Hemiplegia - caused by: Nontraumatic intracerebral hemorrhage   Activity Tolerance Patient tolerated treatment well   Patient Left in bed;with call bell/phone within reach;with bed alarm set;with family/visitor present;with SCD's reapplied   Nurse Communication Mobility status;Need for lift equipment        Time: 7829-5621 OT Time Calculation (min): 43 min  Charges: OT General Charges $OT Visit: 1 Visit OT Treatments $Self Care/Home Management : 8-22 mins $Therapeutic Activity: 8-22 mins  Barry Brunner, OT Acute Rehabilitation Services Office [phone]   Chancy Milroy 04/29/2023, 2:04 PM

## 2023-04-29 NOTE — Progress Notes (Addendum)
Physical Therapy Treatment Patient Details Name: Wendy Fleming MRN: [id number] DOB: [birth year] Today's Date: 04/29/2023   History of Present Illness Pt is an 82 y.o. female who presented 04-17-2023 after being found minimally responsive. 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 ETT April 17, 2023 - 9/1. Re-intubated 9/9. PMH: anemia, arthritis, HTN    PT Comments  Pt received in bed, lethargic but arouses to name, especially when family says it. Pt needed max A +2 to come to sitting EOB but once up was able to maintain balance with as little as contact guard A. Pt actively moving all 4 extremities, L>R, but limited by weakness and tremor. Pt fatigues quickly in unsupported sitting. Stretching to chest and for cervical extension done in sitting EOB. Recommend therapy in long term post acute setting. PT will continue to follow.     If plan is discharge home, recommend the following: Two people to help with walking and/or transfers;Two people to help with bathing/dressing/bathroom;Assistance with cooking/housework;Direct supervision/assist for medications management;Direct supervision/assist for financial management;Assist for transportation;Help with stairs or ramp for entrance   Can travel by private vehicle        Equipment Recommendations  Other (comment) (defer to next venue of care)    Recommendations for Other Services Rehab consult     Precautions / Restrictions Precautions Precautions: Fall;Other (comment) Precaution Comments: EVD (clamp for sessions), intubated, flexiseal, foley, watch HR Restrictions Weight Bearing Restrictions: No     Mobility  Bed Mobility Overal bed mobility: Needs Assistance Bed Mobility: Supine to Sit, Sit to Supine     Supine to sit: +2 for physical assistance, +2 for safety/equipment, HOB elevated, Max assist Sit to supine: Total assist, +2 for physical  assistance, +2 for safety/equipment, HOB elevated   General bed mobility comments: pt with core activation with transition from supine to sit but completely dependent for return to supine. Pt limited with reaching when cued due to B shoulder pain and stiffness    Transfers                   General transfer comment: unable    Ambulation/Gait               General Gait Details: unable   Stairs             Wheelchair Mobility     Tilt Bed    Modified Rankin (Stroke Patients Only) Modified Rankin (Stroke Patients Only) Pre-Morbid Rankin Score: No symptoms Modified Rankin: Severe disability     Balance Overall balance assessment: Needs assistance Sitting-balance support: Single extremity supported, Bilateral upper extremity supported, Feet supported Sitting balance-Leahy Scale: Poor Sitting balance - Comments: support needed varied from mod A to contact guard A with time up. Initially trying to brace self with LUE but maintained more erect sitting with L hand on L knee. Postural control: Posterior lean, Left lateral lean                                  Cognition Arousal: Lethargic Behavior During Therapy: Flat affect (appeared to try to smile a couple times) Overall Cognitive Status: Impaired/Different from baseline Area of Impairment: Attention, Following commands, Safety/judgement, Awareness, Problem solving                 Orientation Level: Disoriented to, Time, Situation, Place Current Attention Level: Focused   Following  Commands: Follows one step commands inconsistently, Follows one step commands with increased time Safety/Judgement: Decreased awareness of safety, Decreased awareness of deficits Awareness: Intellectual Problem Solving: Decreased initiation, Slow processing, Difficulty sequencing, Requires verbal cues, Requires tactile cues General Comments: more alert when her name is called, esp when called by her  Wendy Fleming. Follows commands intermittently with LUE and BLE's, tremor noted in all 4 extremities this session        Exercises General Exercises - Lower Extremity Ankle Circles/Pumps: PROM, Both, 10 reps, Seated Long Arc Quad: AAROM, Both, Seated, 10 reps    General Comments General comments (skin integrity, edema, etc.): HR in 80's, BP 124/64, SPO2 99%      Pertinent Vitals/Pain Pain Assessment Pain Assessment: Faces Faces Pain Scale: Hurts even more Pain Location: B shoulders and R knee with ROM Pain Descriptors / Indicators: Grimacing, Guarding, Tightness Pain Intervention(s): Limited activity within patient's tolerance, Monitored during session, Repositioned    Home Living                          Prior Function            PT Goals (current goals can now be found in the care plan section) Acute Rehab PT Goals Patient Stated Goal: did not state PT Goal Formulation: With patient/family Time For Goal Achievement: 05/12/23 Potential to Achieve Goals: Fair Progress towards PT goals: Progressing toward goals    Frequency    Min 1X/week      PT Plan      Co-evaluation PT/OT/SLP Co-Evaluation/Treatment: Yes Reason for Co-Treatment: Complexity of the patient's impairments (multi-system involvement);Necessary to address cognition/behavior during functional activity;For patient/therapist safety;To address functional/ADL transfers PT goals addressed during session: Mobility/safety with mobility;Balance OT goals addressed during session: Strengthening/ROM      AM-PAC PT "6 Clicks" Mobility   Outcome Measure  Help needed turning from your back to your side while in a flat bed without using bedrails?: Total Help needed moving from lying on your back to sitting on the side of a flat bed without using bedrails?: Total Help needed moving to and from a bed to a chair (including a wheelchair)?: Total Help needed standing up from a chair using your arms (e.g.,  wheelchair or bedside chair)?: Total Help needed to walk in hospital room?: Total Help needed climbing 3-5 steps with a railing? : Total 6 Click Score: 6    End of Session Equipment Utilized During Treatment: Oxygen Activity Tolerance: Patient tolerated treatment well Patient left: in bed;with call bell/phone within reach;with bed alarm set;with family/visitor present Nurse Communication: Mobility status PT Visit Diagnosis: Muscle weakness (generalized) (M62.81);Difficulty in walking, not elsewhere classified (R26.2);Other symptoms and signs involving the nervous system (R29.898)     Time: 1610-9604 PT Time Calculation (min) (ACUTE ONLY): 42 min  Charges:    $Therapeutic Activity: 8-22 mins PT General Charges $$ ACUTE PT VISIT: 1 Visit                     Lyanne Co, PT  Acute Rehab Services Secure chat preferred Office [phone]    Lawana Chambers Aloys Hupfer 04/29/2023, 12:38 PM

## 2023-04-29 NOTE — Progress Notes (Signed)
NAME:  CELESTIAL VENDITTO, MRN:  [id number], DOB:  April 26, 1941, LOS: 17 ADMISSION DATE:  03/22/2023, CONSULTATION DATE:  04/04/2023 REFERRING MD:  NSGY, CHIEF COMPLAINT:  AMS   History of Present Illness:  82 year old woman who presented to [facility] ED 8/30 after husband found her down minimally responsive.  Vomited multiple times en route to ER.  Intubated for airway protection in ER.  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  NSGY to place EVD and determine best method of securing aneurysm.  PCCM to consult for vent management.  Hunt Hess 3/4 mFS 2 (thin, +IVH)  Pertinent Medical History:  HTN Anemia  Significant Hospital Events: Including procedures, antibiotic start and stop dates in addition to other pertinent events   8/30 EVD placement in ER, admit Coil embolization Left Acomm aneurysm  8/31 Tolerating SBT, able to follow simple commands  9/1 intermittent episodes of agitation overnight, bilateral wrist restraints added, extubated 9/2 TCD remaining neg for vasospasm 9/4 Waxing/waning mental status, at times somnolent. EVD functioning well. 9/5 More somnolent, NSGY notified. EVD functioning. Na 129 (135). Foley placed for strict I&Os while correcting sodium, HTS 3% started. Febrile to 101.51F, PCT negative. LTM EEG per NSGY. 9/6 HTS 3% ongoing, Na corrected to 136.  9/9 Fevers/leukocytosis > re-intubated for PNA, airway protection 9/11 Intermittently following commands, no further fevers. 9/12 Off sedation this morning, mild tachypnea. Following commands but very weak failed SBT after 4 hours 9/13 tolerated SBT in a.m. but became significantly tachypneic and tachycardic after working with PT 9/14 remains tachypneic with increased respiratory effort this a.m. when sedation lightened.  Febrile overnight with Tmax of 101.8 9/15 Plans for placement of bedside trach this afternoon   Interim History / Subjective:  Patient had tracheostomy placed yesterday, tolerated  well Became afebrile after reinitiation of antibiotics Currently on trach collar  Objective:  Blood pressure (!) 143/76, pulse 79, temperature 98 F (36.7 C), temperature source Axillary, resp. rate (!) 26, height 5\' 2"  (1.575 m), weight 85 kg, SpO2 100%.    Vent Mode: PRVC FiO2 (%):  [30 %-40 %] 35 % Set Rate:  [18 bmp] 18 bmp Vt Set:  [400 mL] 400 mL PEEP:  [5 cmH20] 5 cmH20 Pressure Support:  [10 cmH20] 10 cmH20 Plateau Pressure:  [13 cmH20-15 cmH20] 13 cmH20   Intake/Output Summary (Last 24 hours) at 04/29/2023 0943 Last data filed at 04/29/2023 0600 Gross per 24 hour  Intake 973.9 ml  Output 2108 ml  Net -1134.1 ml   Filed Weights   04/27/23 0500 04/28/23 0500 04/29/23 0500  Weight: 81.9 kg 86.1 kg 85 kg   Physical Examination: General: Crtitically ill-appearing female, s/p trach on trach collar HEENT: [place]/AT, eyes anicteric.  Cortrak in place.  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 8/30  Continue neuro watch Neurosurgery is following Continue permissive hypertension Daily TCD's Completed 7-day therapy with Keppra for seizure prophylaxis Continue nimodipine EVD management deferred to neurosurgery  Acute encephalopathy due to subarachnoid hemorrhage Mental status is slowly improving Avoid  sedation  Acute hypoxemic respiratory failure due to Aspiration pneumonia, now status post trach Started spiking fever  again X-ray chest showing infiltrates in left lower lobe Continue IV Zosyn Continue to follow respiratory culture and adjust antibiotic accordingly Currently on trach collar, continue as long as patient can tolerate Watch for respiratory distress  Dysphagia Continue tube feeds  Acute urinary retention Continue Foley catheter Failed voiding trial   Best Practice: (right click and "Reselect all SmartList Selections" daily)   Diet/type: NPO, tube feeds DVT prophylaxis: Subcu heparin GI prophylaxis: PPI Lines: N/A EVD Foley:  N/A Code Status:  full code Last date of multidisciplinary goals of care discussion [Per Primary]    The patient is critically ill due to aneurysmal subarachnoid hemorrhage/acute respiratory failure.  Critical care was necessary to treat or prevent imminent or life-threatening deterioration.  Critical care was time spent personally by me on the following activities: development of treatment plan with patient and/or surrogate as well as nursing, discussions with consultants, evaluation of patient's response to treatment, examination of patient, obtaining history from patient or surrogate, ordering and performing treatments and interventions, ordering and review of laboratory studies, ordering and review of radiographic studies, pulse oximetry, re-evaluation of patient's condition and participation in multidisciplinary rounds.   During this encounter critical care time was devoted to patient care services described in this note for 36 minutes.     Cheri Fowler, MD [place] See Amion for pager If no response to pager, please call [phone] until 7pm After 7pm, Please call E-link [phone]

## 2023-04-30 DIAGNOSIS — G9340 Encephalopathy, unspecified: Secondary | ICD-10-CM | POA: Diagnosis not present

## 2023-04-30 DIAGNOSIS — Z7189 Other specified counseling: Secondary | ICD-10-CM

## 2023-04-30 DIAGNOSIS — Z930 Tracheostomy status: Secondary | ICD-10-CM

## 2023-04-30 DIAGNOSIS — I609 Nontraumatic subarachnoid hemorrhage, unspecified: Secondary | ICD-10-CM | POA: Diagnosis not present

## 2023-04-30 DIAGNOSIS — J9601 Acute respiratory failure with hypoxia: Secondary | ICD-10-CM | POA: Diagnosis not present

## 2023-04-30 LAB — BASIC METABOLIC PANEL
Anion gap: 10 (ref 5–15)
BUN: 14 mg/dL (ref 8–23)
CO2: 23 mmol/L (ref 22–32)
Calcium: 9 mg/dL (ref 8.9–10.3)
Chloride: 103 mmol/L (ref 98–111)
Creatinine, Ser: 0.6 mg/dL (ref 0.44–1.00)
GFR, Estimated: >60 mL/min (ref >60)
Glucose, Bld: 161 mg/dL — ABNORMAL HIGH (ref 70–99)
Potassium: 3.6 mmol/L (ref 3.5–5.1)
Sodium: 136 mmol/L (ref 135–145)

## 2023-04-30 LAB — GLUCOSE, CAPILLARY
Glucose-Capillary: 147 mg/dL — ABNORMAL HIGH (ref 70–99)
Glucose-Capillary: 145 mg/dL — ABNORMAL HIGH (ref 70–99)
Glucose-Capillary: 133 mg/dL — ABNORMAL HIGH (ref 70–99)
Glucose-Capillary: 125 mg/dL — ABNORMAL HIGH (ref 70–99)

## 2023-04-30 MED ORDER — ACETAMINOPHEN 650 MG RE SUPP: 650.0000 mg | Freq: Four times a day (QID) | RECTAL | Status: DC | PRN

## 2023-04-30 MED ORDER — GLYCOPYRROLATE 0.2 MG/ML IJ SOLN: 0.2000 mg | INTRAMUSCULAR | Status: DC | PRN

## 2023-04-30 MED ORDER — MORPHINE SULFATE (PF) 2 MG/ML IV SOLN
2.0000 mg | INTRAVENOUS | Status: DC | PRN
  Administered 2023-04-30 – 2023-05-07 (×4): 2 mg via INTRAVENOUS
  Administered 2023-05-18: 4 mg via INTRAVENOUS
  Filled 2023-04-30: qty 1
  Filled 2023-04-30: qty 2
  Filled 2023-04-30 (×2): qty 1

## 2023-04-30 MED ORDER — MIDAZOLAM HCL 2 MG/2ML IJ SOLN: 2.0000 mg | INTRAMUSCULAR | Status: DC | PRN

## 2023-04-30 MED ORDER — GLYCOPYRROLATE 1 MG PO TABS: 1.0000 mg | ORAL_TABLET | ORAL | Status: DC | PRN

## 2023-04-30 MED ORDER — HALOPERIDOL LACTATE 5 MG/ML IJ SOLN: 2.5000 mg | INTRAMUSCULAR | Status: DC | PRN

## 2023-04-30 MED ORDER — ONDANSETRON HCL 4 MG/2ML IJ SOLN: 4.0000 mg | Freq: Four times a day (QID) | INTRAMUSCULAR | Status: DC | PRN

## 2023-04-30 MED ORDER — ACETAMINOPHEN 325 MG PO TABS: 650.0000 mg | ORAL_TABLET | Freq: Four times a day (QID) | ORAL | Status: DC | PRN

## 2023-04-30 MED ORDER — ONDANSETRON 4 MG PO TBDP: 4.0000 mg | ORAL_TABLET | Freq: Four times a day (QID) | ORAL | Status: DC | PRN

## 2023-04-30 MED ORDER — POLYVINYL ALCOHOL 1.4 % OP SOLN: 1.0000 [drp] | Freq: Four times a day (QID) | OPHTHALMIC | Status: DC | PRN

## 2023-04-30 MED ORDER — GLYCOPYRROLATE 0.2 MG/ML IJ SOLN
0.2000 mg | INTRAMUSCULAR | Status: DC | PRN
  Administered 2023-05-01: 0.2 mg via INTRAVENOUS
  Filled 2023-04-30: qty 1

## 2023-04-30 MED ORDER — SODIUM CHLORIDE 0.9 % IV SOLN: INTRAVENOUS | Status: DC

## 2023-04-30 NOTE — Progress Notes (Signed)
PT Cancellation Note  Patient Details Name: Wendy Fleming MRN: [id number] DOB: Jul 30, 1941   Cancelled Treatment:    Reason Eval/Treat Not Completed: Other (comment) - ongoing pt/family discussions, RN request PT hold today. PT to check back tomorrow as appropriate.   Marye Round, PT DPT Acute Rehabilitation Services Secure Chat Preferred  Office [phone]    Javares Kaufhold Sheliah Plane 04/30/2023, 10:06 AM

## 2023-04-30 NOTE — Progress Notes (Signed)
  NEUROSURGERY PROGRESS NOTE   Pt seen and examined. No issues overnight. Family at bedside  EXAM: Temp:  [98.4 F (36.9 C)-100.4 F (38 C)] 99.4 F (37.4 C) (09/17 1200) Pulse Rate:  [68-95] 95 (09/17 1100) Resp:  [18-38] 33 (09/17 1100) BP: (116-151)/(60-116) 130/69 (09/17 1100) SpO2:  [95 %-100 %] 98 % (09/17 1100) FiO2 (%):  [30 %-35 %] 30 % (09/17 1100) Weight:  [84 kg] 84 kg (09/17 0500) Intake/Output      09/16 0701 09/17 0700 09/17 0701 09/18 0700   I.V. (mL/kg) 27.5 (0.3)    NG/GT 1040 200   IV Piggyback 175.3 31.6   Total Intake(mL/kg) 1242.8 (14.8) 231.6 (2.8)   Urine (mL/kg/hr) 1450 (0.7)    Emesis/NG output     Drains 59    Stool 750    Total Output 2259    Net -1016.3 +231.6        Urine Occurrence 1 x     Eyes open to pain Breathing spontaneously on trach collar CN grossly intact Not following commands, minimal w/d to pain EVD clamped  LABS: Lab Results  Component Value Date   CREATININE 0.60 04/30/2023   BUN 14 04/30/2023   NA 136 04/30/2023   K 3.6 04/30/2023   CL 103 04/30/2023   CO2 23 04/30/2023   Lab Results  Component Value Date   WBC 11.0 (H) 04/29/2023   HGB 10.4 (L) 04/29/2023   HCT 34.4 (L) 04/29/2023   MCV 96.6 04/29/2023   PLT 344 04/29/2023     IMPRESSION: - 82 y.o. female SAH d# 19 s/p coil embolization Acom aneurysm and EVD for associated HCP, neurologically unchanged, appears mostly unresponsive. I cont to believe that while meaningful recovery is possible, improvement to the point of interactivity is highly unlikely. I cont to believe the most likely possibility is a long-term care/nursing facility. I have spoken to Dr. Merrily Pew (PCCM) who agrees.   I reviewed the possibly longer term outcomes with the patient's husband, daughter, and son at bedside. In conjunction with the patient's living will, they have decided to change goals of care to comfort measures rather than continuing with aggressive supportive care.  PLAN: - I  have d/c'ed EVD  - PCCM to manage comfort measures for now, can also consult palliative care team for further assistance.   Lisbeth Renshaw, MD [facility]

## 2023-04-30 NOTE — Progress Notes (Signed)
Notified that after discussion with NSGY, family has elected to pursue comfort care.  NSGY has removed EVD in this setting.   I d/w pt family who confirms this plan. We talked this morning regarding what comfort care entails and there are not add'l questions at this time. I spoke specifically to code status and that a DNR bracelet would be placed accordingly.   All questions answered.  Orders placed for comfort care, DNR, transfer to palliative floor     Tessie Fass MSN, AGACNP-BC [facility] Pulmonary/Critical Care Medicine 04/30/2023, 2:29 PM

## 2023-04-30 NOTE — Progress Notes (Signed)
Report called to [facility] [phone]. All information covered and questions answered.

## 2023-04-30 NOTE — Progress Notes (Addendum)
NAME:  Wendy Fleming, MRN:  [id number], DOB:  03-21-1941, LOS: 18 ADMISSION DATE:  April 17, 2023, CONSULTATION DATE:  04-17-23 REFERRING MD:  NSGY, CHIEF COMPLAINT:  AMS   History of Present Illness:  82 year old woman who presented to [facility] ED 04/17/2023 after husband found her down minimally responsive.  Vomited multiple times en route to ER.  Intubated for airway protection in ER.  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  NSGY to place EVD and determine best method of securing aneurysm.  PCCM to consult for vent management.  Hunt Hess 3/4 mFS 2 (thin, +IVH)  Pertinent Medical History:  HTN Anemia  Significant Hospital Events: Including procedures, antibiotic start and stop dates in addition to other pertinent events   04/17/23 EVD placement in ER, admit Coil embolization Left Acomm aneurysm  8/31 Tolerating SBT, able to follow simple commands  9/1 intermittent episodes of agitation overnight, bilateral wrist restraints added, extubated 9/2 TCD remaining neg for vasospasm 9/4 Waxing/waning mental status, at times somnolent. EVD functioning well. 9/5 More somnolent, NSGY notified. EVD functioning. Na 129 (135). Foley placed for strict I&Os while correcting sodium, HTS 3% started. Febrile to 101.50F, PCT negative. LTM EEG per NSGY. 9/6 HTS 3% ongoing, Na corrected to 136.  9/9 Fevers/leukocytosis > re-intubated for PNA, airway protection 9/11 Intermittently following commands, no further fevers. 9/12 Off sedation this morning, mild tachypnea. Following commands but very weak failed SBT after 4 hours 9/13 tolerated SBT in a.m. but became significantly tachypneic and tachycardic after working with PT 9/14 remains tachypneic with increased respiratory effort this a.m. when sedation lightened.  Febrile overnight with Tmax of 101.8 9/15 Plans for placement of bedside trach this afternoon  9/16 NSGY discussed likely prognosis with family. EVD clamped  9/17 PCCM discussed GOC w  family   Interim History / Subjective:  Conversation w NSGY and family yesterday noted   NAEO No labs this morning   On trach collar   Objective:  Blood pressure (!) 148/80, pulse 90, temperature 98.4 F (36.9 C), temperature source Axillary, resp. rate (!) 32, height 5\' 2"  (1.575 m), weight 84 kg, SpO2 97%.    FiO2 (%):  [30 %-35 %] 30 %   Intake/Output Summary (Last 24 hours) at 04/30/2023 1105 Last data filed at 04/30/2023 0700 Gross per 24 hour  Intake 975.94 ml  Output 1637 ml  Net -661.06 ml   Filed Weights   04/28/23 0500 04/29/23 0500 04/30/23 0500  Weight: 86.1 kg 85 kg 84 kg   Physical Examination  General: WDWN ill appearing elderly  F NAD  HEENT: Trach secure. Anicteric sclera  Neuro: EVD. She grimaces to noxious stimulation. Does not follow commands. Has some spontaneous movement  Pulm: symmetrical chest expansion, some scattered rhonchi  Heart: rrr cap refill < 3 sec  Abdomen: round ndnt  Skin: c/d/w    Resolved Hospital Problem List:   Hyponatremia, improving -CSW versus ?SIADH, hypertonic saline stopped 9/13 Hypophosphatemia Hypokalemia   Assessment & Plan:     SAH 2/2 ruptured Acomm, w IVH -- s/p coil embo 17-Apr-2023) Obstructive hydrocephalus s/p EVD April 17, 2023) Acute encephalopathy due to above P -EVD per NSGY -cont nimotop for 21d -follow neuro exam, off sedation -delirium precautions   Acute hypoxic resp failure Aspiration PNA LLL  S/p trach  P -zosyn -- follow cx, currently abundant GNR  -routine trach care  Dysphagia  -EN    GOC -family is trying to reconcile prospective ongoing aggressive care and associated trajectory/timeline with what pt  has previously expressed -in context of her age, her neuro insult, concomitant PNA and complicating factors like likely worsening deconditioning it is obviously difficult to predict an exact outcome.  -felt unlikely that she will return home with family support, and minimal chance of being  functionally independent.Would likely take weeks (8-12 was a range d/w family) to figure out what a new baseline might be, and this would likely involve PEG, possible shunt, as well as facility placement.  -in d/w CCM, family leans against pursuing these aggressive measures and does not think pt would want to go through these things for such a time to determine a functional baseline. They were encouraged to discuss further w NSGY -We are happy to support however they decide -- we talked in depth about what a comfort-focussed trajectory entails as well.   Best Practice: (right click and "Reselect all SmartList Selections" daily)   Diet/type: NPO, tube feeds DVT prophylaxis: Subcu heparin GI prophylaxis: PPI Lines: N/A EVD Foley:  N/A Code Status:  full code Last date of multidisciplinary goals of care discussion [Per Primary]    CRITICAL CARE Performed by: Lanier Clam   Total critical care time: 50 minutes  Critical care time was exclusive of separately billable procedures and treating other patients. Critical care was necessary to treat or prevent imminent or life-threatening deterioration.  Critical care was time spent personally by me on the following activities: development of treatment plan with patient and/or surrogate as well as nursing, discussions with consultants, evaluation of patient's response to treatment, examination of patient, obtaining history from patient or surrogate, ordering and performing treatments and interventions, ordering and review of laboratory studies, ordering and review of radiographic studies, pulse oximetry and re-evaluation of patient's condition.  Tessie Fass MSN, AGACNP-BC [place] [facility]/Critical Care Medicine Amion for pager  04/30/2023, 11:05 AM

## 2023-04-30 NOTE — Progress Notes (Signed)
SLP Cancellation Note  Patient Details Name: Wendy Fleming MRN: [id number] DOB: [birth year]   Cancelled treatment:       Reason Eval/Treat Not Completed: Other (comment) ongoing pt/family discussions, RN request SLP hold today. SLP to check back as appropriate.     Mahala Menghini., M.A. CCC-SLP Acute Rehabilitation Services Office [phone]  Secure chat preferred  04/30/2023, 10:07 AM

## 2023-05-01 DIAGNOSIS — Z515 Encounter for palliative care: Secondary | ICD-10-CM

## 2023-05-01 DIAGNOSIS — Z930 Tracheostomy status: Secondary | ICD-10-CM

## 2023-05-01 DIAGNOSIS — Z7189 Other specified counseling: Secondary | ICD-10-CM | POA: Diagnosis not present

## 2023-05-01 DIAGNOSIS — I609 Nontraumatic subarachnoid hemorrhage, unspecified: Secondary | ICD-10-CM | POA: Diagnosis not present

## 2023-05-01 DIAGNOSIS — R4182 Altered mental status, unspecified: Secondary | ICD-10-CM | POA: Diagnosis not present

## 2023-05-01 MED ORDER — GLYCOPYRROLATE 0.2 MG/ML IJ SOLN
0.4000 mg | INTRAMUSCULAR | Status: DC | PRN
  Administered 2023-05-02 – 2023-05-17 (×9): 0.4 mg via INTRAVENOUS
  Filled 2023-05-01 (×10): qty 2

## 2023-05-01 NOTE — Care Management Important Message (Addendum)
Important Message  Patient Details  Name: RENELL PICARDI MRN: [id number] Date of Birth: April 04, 1941   Medicare Important Message Given:  No  9/18 IMM Letter respectfully not given due to patient being on Comfort Measures.   Sherilyn Banker 05/01/2023, 12:21 PM

## 2023-05-01 NOTE — Plan of Care (Signed)
Problem: Clinical Measurements: Goal: Will remain free from infection Outcome: Progressing   Problem: Safety: Goal: Ability to remain free from injury will improve Outcome: Progressing

## 2023-05-01 NOTE — Progress Notes (Signed)
Nutrition Brief Note  Chart reviewed. Pt now transitioning to comfort care. NPO, TF discontinued by provider. No further nutrition interventions planned at this time.  Please re-consult as needed.   Romelle Starcher MS, RDN, LDN, CNSC Registered Dietitian 3 Clinical Nutrition RD Pager and On-Call Pager Number Located in [place]

## 2023-05-01 NOTE — Progress Notes (Signed)
Daily Progress Note   [name]       Date: 05/01/2023 DOB: 1941-07-25  Age: 82 y.o. MRN#: [id number] Attending Physician: Jerald Kief, MD Primary Care Physician: Juluis Rainier, MD (Inactive) Admit Date: 25-Apr-2023  Reason for Consultation/Follow-up: Establishing goals of care and Terminal Care  Subjective: Medical records reviewed including progress notes, labs, and imaging. Patient assessed at the bedside. Her daughter is present visiting at the bedside, expects more family to arrive shortly as well.  Created space and opportunity for family's thoughts and feelings on patient's current illness. Reviewed events since initial conversation with my colleague over the weekend. Goals are clear for comfort focused care and avoiding further suffering.  Reviewed currently available comfort-focused medications and MAR with patient's daughter. Discussed options to escalate opioids and continue titration based on patient's symptom burden. Counseled on the process of weaning oxygen and explained concern that higher rates/concentrations may prolong the dying process. Patient's daughter verbalized her understanding, sharing her preference to defer weaning oxygen until additional family members can arrive in town to visit. Her husband is flying from [place] and expected to arrive late tomorrow.  We also discussed option of transfer to residential hospice. Similarly to weaning oxygen, daughter would also like to defer this until Friday if possible.   Questions and concerns addressed. PMT will continue to support holistically.   Length of Stay: 19   Physical Exam Vitals and nursing note reviewed.  Constitutional:      General: She is not in acute distress. Cardiovascular:      Rate and Rhythm: Normal rate.  Pulmonary:     Effort: Pulmonary effort is normal. No respiratory distress.  Skin:    General: Skin is warm and dry.  Neurological:     Mental Status: She is unresponsive.             Vital Signs: BP (!) 150/94 (BP Location: Left Arm)   Pulse 76   Temp 98.2 F (36.8 C) (Axillary)   Resp (!) 30   Ht 5\' 2"  (1.575 m)   Wt 84 kg   SpO2 97%   BMI 33.87 kg/m  SpO2: SpO2: 97 % O2 Device: O2 Device: Tracheostomy Collar O2 Flow Rate: O2 Flow Rate (L/min): 8 L/min      Palliative Assessment/Data: 10%  Palliative Care Assessment & Plan   Patient Profile: 82 y.o. female admitted to [facility] 8/30 with ruptured Acomm who is s/p EVD by neurosurgery.  Hospital course has been complicated by respiratory failure with aspiration pneumonia.  She has been unable to be weaned from mechanical ventilation and consideration for tracheostomy.   PMT initially consulted on 9/14 and family elected to proceed with tracheostomy. She has declined further and now has transitioned to full comfort measures after discussion with neurosurgery and PCCM. PMT has been re-consulted on 9/18 for "comfort - likely residential hospice."  Assessment: End of life care  Recommendations/Plan: Continued DNR/DNI Continue comfort-focused care with PRN morphine for pain/air hunger, no adjustments required today Family prefers to hold off on weaning O2 or transferring to residential hospice until Friday Psychosocial and emotional support provided PMT will continue to follow and support   Prognosis:  < 2 weeks  Discharge Planning: To Be Determined  Care plan was discussed with patient's daughter, MD   MDM high         Abegail Kloeppel Jeni Salles, PA-C  Palliative Medicine Team Team phone # [phone]  Thank you for allowing the Palliative Medicine Team to assist in the care of this patient. Please utilize secure chat with additional questions, if there is no response within 30 minutes  please call the above phone number.  Palliative Medicine Team providers are available by phone from 7am to 7pm daily and can be reached through the team cell phone.  Should this patient require assistance outside of these hours, please call the patient's attending physician.

## 2023-05-01 NOTE — Progress Notes (Signed)
Progress Note   Patient: Wendy Fleming DOB: 01/07/1941 DOA: 04/13/2023     19 DOS: the patient was seen and examined on 05/01/2023   Brief hospital course: 82yo with hx anemia, htn who presented from home after being found unresponsive and vomiting. Workup demonstrated aneurysmal subarachnoid hemorrhage s/p coil embolization. Neurosurgery had been following. Pt was intubated for airway protection. During hospital course, it was felt unlikely she will return home with family support and minimal chance of being functionally independent. Decision was ultimately made with family for transition to comfort.  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 Anticipate transition to residential hospice Friday -Anticipated prognosis is <2 weeks  DNR -Comfort measures     Subjective: Unable to assess given mentation  Physical Exam: Vitals:   04/30/23 2052 04/30/23 2256 05/01/23 0507 05/01/23 0633  BP:   (!) 150/94   Pulse: 74 75 76   Resp: (!) 30 (!) 0 (!) 30   Temp:   98.2 F (36.8 C)   TempSrc:   Axillary   SpO2: 95% 94% 98% 97%  Weight:      Height:       General exam: Awake, laying in bed, in nad Respiratory system: Normal respiratory effort, trach in place Skin: Normal skin turgor, no notable skin lesions seen Psychiatry: Unable to assess given mentation   Data Reviewed:  There are no new results to review at this time.  Family Communication: Family at bedside  Disposition: Status is:  Inpatient Remains inpatient appropriate because: severity of illness  Planned Discharge Destination:  residential hospice     Author: Rickey Barbara, MD 05/01/2023 6:42 PM  For on call review [email or web address].

## 2023-05-01 NOTE — Hospital Course (Addendum)
82yo with hx anemia, htn who presented from home after being found unresponsive and vomiting. Workup demonstrated aneurysmal subarachnoid hemorrhage s/p coil embolization. Neurosurgery had been following. Pt was intubated for airway protection. During hospital course, it was felt unlikely she will return home with family support and minimal chance of being functionally independent. Decision was ultimately made with family for transition to comfort.

## 2023-05-01 NOTE — Plan of Care (Signed)
  Problem: Education: Goal: Knowledge of disease or condition will improve Outcome: Not Progressing Goal: Knowledge of secondary prevention will improve (MUST DOCUMENT ALL) Outcome: Not Progressing Goal: Knowledge of patient specific risk factors will improve [name] N/A or DELETE if not current risk factor) Outcome: Not Progressing   Problem: Spontaneous Subarachnoid Hemorrhage Tissue Perfusion: Goal: Complications of Spontaneous Subarachnoid Hemorrhage will be minimized Outcome: Not Progressing   Problem: Coping: Goal: Will verbalize positive feelings about self Outcome: Not Progressing Goal: Will identify appropriate support needs Outcome: Not Progressing   Problem: Health Behavior/Discharge Planning: Goal: Ability to manage health-related needs will improve Outcome: Not Progressing Goal: Goals will be collaboratively established with patient/family Outcome: Not Progressing   Problem: Self-Care: Goal: Ability to participate in self-care as condition permits will improve Outcome: Not Progressing Goal: Verbalization of feelings and concerns over difficulty with self-care will improve Outcome: Not Progressing Goal: Ability to communicate needs accurately will improve Outcome: Not Progressing   Problem: Nutrition: Goal: Risk of aspiration will decrease Outcome: Not Progressing Goal: Dietary intake will improve Outcome: Not Progressing   Problem: Activity: Goal: Ability to tolerate increased activity will improve Outcome: Not Progressing   Problem: Respiratory: Goal: Ability to maintain a clear airway and adequate ventilation will improve Outcome: Not Progressing   Problem: Role Relationship: Goal: Method of communication will improve Outcome: Not Progressing   Problem: Education: Goal: Knowledge about tracheostomy care/management will improve Outcome: Not Progressing   Problem: Activity: Goal: Ability to tolerate increased activity will improve Outcome: Not  Progressing   Problem: Health Behavior/Discharge Planning: Goal: Ability to manage tracheostomy will improve Outcome: Not Progressing   Problem: Respiratory: Goal: Patent airway maintenance will improve Outcome: Not Progressing   Problem: Role Relationship: Goal: Ability to communicate will improve Outcome: Not Progressing   Problem: Education: Goal: Knowledge about tracheostomy care/management will improve Outcome: Not Progressing   Problem: Activity: Goal: Ability to tolerate increased activity will improve Outcome: Not Progressing   Problem: Health Behavior/Discharge Planning: Goal: Ability to manage tracheostomy will improve Outcome: Not Progressing   Problem: Respiratory: Goal: Patent airway maintenance will improve Outcome: Not Progressing   Problem: Role Relationship: Goal: Ability to communicate will improve Outcome: Not Progressing   Problem: Education: Goal: Knowledge of General Education information will improve Description: Including pain rating scale, medication(s)/side effects and non-pharmacologic comfort measures Outcome: Not Progressing   Problem: Health Behavior/Discharge Planning: Goal: Ability to manage health-related needs will improve Outcome: Not Progressing   Problem: Clinical Measurements: Goal: Ability to maintain clinical measurements within normal limits will improve Outcome: Not Progressing Goal: Will remain free from infection Outcome: Not Progressing Goal: Diagnostic test results will improve Outcome: Not Progressing Goal: Respiratory complications will improve Outcome: Not Progressing Goal: Cardiovascular complication will be avoided Outcome: Not Progressing   Problem: Activity: Goal: Risk for activity intolerance will decrease Outcome: Not Progressing   Problem: Nutrition: Goal: Adequate nutrition will be maintained Outcome: Not Progressing   Problem: Coping: Goal: Level of anxiety will decrease Outcome: Not  Progressing   Problem: Elimination: Goal: Will not experience complications related to bowel motility Outcome: Not Progressing Goal: Will not experience complications related to urinary retention Outcome: Not Progressing   Problem: Pain Managment: Goal: General experience of comfort will improve Outcome: Not Progressing   Problem: Safety: Goal: Ability to remain free from injury will improve Outcome: Not Progressing   Problem: Skin Integrity: Goal: Risk for impaired skin integrity will decrease Outcome: Not Progressing    Arnell Slivinski Tamera Stands, RN

## 2023-05-02 DIAGNOSIS — Z7189 Other specified counseling: Secondary | ICD-10-CM | POA: Diagnosis not present

## 2023-05-02 DIAGNOSIS — Z515 Encounter for palliative care: Secondary | ICD-10-CM | POA: Diagnosis not present

## 2023-05-02 DIAGNOSIS — R4182 Altered mental status, unspecified: Secondary | ICD-10-CM | POA: Diagnosis not present

## 2023-05-02 DIAGNOSIS — I609 Nontraumatic subarachnoid hemorrhage, unspecified: Secondary | ICD-10-CM | POA: Diagnosis not present

## 2023-05-02 NOTE — Progress Notes (Signed)
Patient family has been inform about the isolation precaution that was place on outside of patient room.

## 2023-05-02 NOTE — Plan of Care (Signed)
  Problem: Role Relationship: Goal: Ability to communicate will improve Outcome: Not Applicable   Problem: Education: Goal: Knowledge about tracheostomy care/management will improve Outcome: Not Applicable   Problem: Activity: Goal: Ability to tolerate increased activity will improve Outcome: Not Applicable

## 2023-05-02 NOTE — Progress Notes (Signed)
Progress Note   Patient: Wendy Fleming:[id number] DOB: Aug 18, 1940 DOA: 03/21/2023     20 DOS: the patient was seen and examined on 05/02/2023   Brief hospital course: 82yo with hx anemia, htn who presented from home after being found unresponsive and vomiting. Workup demonstrated aneurysmal subarachnoid hemorrhage s/p coil embolization. Neurosurgery had been following. Pt was intubated for airway protection. During hospital course, it was felt unlikely she will return home with family support and minimal chance of being functionally independent. Decision was ultimately made with family for transition to comfort.  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 Morphine gtt started per palliative  Acute hypoxic respiratory failure -Initially intubated for airway protection -Later s/p trach on 9/15  Dysphagia  End of Life -Goals of care were addressed with pt transitioning to comfort measures at this time, per above -Palliative Care following. Anticipate transition to residential hospice Friday -Anticipated prognosis is <2 weeks  DNR -Comfort measures     Subjective: Cannot assess given mentation  Physical Exam: Vitals:   05/02/23 0027 05/02/23 0403 05/02/23 0552 05/02/23 0809  BP:   (!) 157/95   Pulse:   87   Resp:   (!) 26 (!) 25  Temp:   100 F (37.8 C)   TempSrc:   Axillary   SpO2: 97% 97% 97% 97%  Weight:      Height:       General exam: Conversant, in no acute distress Respiratory system: normal chest rise, clear, no audible wheezing   Data Reviewed:  There are no new results to review at this time.  Family Communication: Family at bedside  Disposition: Status is: Inpatient Remains inpatient appropriate because: severity of illness   Planned Discharge Destination:  residential hospice     Author: Rickey Barbara, MD 05/02/2023 5:41 PM  For on call review [email or web address].

## 2023-05-02 NOTE — Progress Notes (Addendum)
Daily Progress Note   [name]       Date: 05/02/2023 DOB: 1941/03/29  Age: 82 y.o. MRN#: [id number] Attending Physician: Jerald Kief, MD Primary Care Physician: Juluis Rainier, MD (Inactive) Admit Date: 03/14/2023  Reason for Consultation/Follow-up: Establishing goals of care and Terminal Care  Subjective: Medical records reviewed including progress notes, labs, and imaging. Patient assessed at the bedside.  She remains unresponsive with RR of about 30.  No family present during my visit.  MAR reviewed. Patient received 1 PRN dose of Robinul during the past 24 hours. No other comfort medications. Discussed with RN.   Addendum: Received update from RN that family would like to discuss weaning O2. Returned to the bedside and met with daughter, son, and daughter-in-law. Educated on the process of weaning oxygen and symptom management strategies including continuous infusion of morphine for prevention of air hunger and dyspnea. They remain interested in starting this process tomorrow morning. We discussed that this could be started at residential hospice as well but they prefer to keep patient in the hospital and avoid moving her again if possible.   Questions and concerns addressed. PMT will continue to support holistically.   Length of Stay: 20   Physical Exam Vitals and nursing note reviewed.  Constitutional:      General: She is not in acute distress. Cardiovascular:     Rate and Rhythm: Normal rate.  Pulmonary:     Effort: Tachypnea present.  Skin:    General: Skin is warm and dry.  Neurological:     Mental Status: She is unresponsive.            Vital Signs: BP (!) 157/95 (BP Location: Left Arm)   Pulse 87   Temp 100 F (37.8 C) (Axillary)   Resp  (!) 25   Ht 5\' 2"  (1.575 m)   Wt 84 kg   SpO2 97%   BMI 33.87 kg/m  SpO2: SpO2: 97 % O2 Device: O2 Device: Tracheostomy Collar O2 Flow Rate: O2 Flow Rate (L/min): 8 L/min      Palliative Assessment/Data: 10%   Palliative Care Assessment & Plan   Patient Profile: 82 y.o. female admitted to [facility] 8/30 with ruptured Acomm who is s/p EVD by neurosurgery.  Hospital course has been complicated by respiratory failure with  aspiration pneumonia.  She has been unable to be weaned from mechanical ventilation and consideration for tracheostomy.   PMT initially consulted on 9/14 and family elected to proceed with tracheostomy. She has declined further and now has transitioned to full comfort measures after discussion with neurosurgery and PCCM. PMT has been re-consulted on 9/18 for "comfort - likely residential hospice."  Assessment: End of life care  Recommendations/Plan: Continued DNR/DNI Continue comfort-focused care, no adjustments required today Discussed with RN to give PRN dose of morphine, 4mg  IV, for tachypnea  PMT will continue to follow and support   Prognosis:  < 2 weeks  Discharge Planning: Hospice facility  Care plan was discussed with RN   MDM high         Kennith Morss Jeni Salles, PA-C  Palliative Medicine Team Team phone # [phone]  Thank you for allowing the Palliative Medicine Team to assist in the care of this patient. Please utilize secure chat with additional questions, if there is no response within 30 minutes please call the above phone number.  Palliative Medicine Team providers are available by phone from 7am to 7pm daily and can be reached through the team cell phone.  Should this patient require assistance outside of these hours, please call the patient's attending physician.

## 2023-05-03 DIAGNOSIS — Z930 Tracheostomy status: Secondary | ICD-10-CM | POA: Diagnosis not present

## 2023-05-03 DIAGNOSIS — R4182 Altered mental status, unspecified: Secondary | ICD-10-CM | POA: Diagnosis not present

## 2023-05-03 DIAGNOSIS — I619 Nontraumatic intracerebral hemorrhage, unspecified: Secondary | ICD-10-CM | POA: Diagnosis not present

## 2023-05-03 DIAGNOSIS — J9601 Acute respiratory failure with hypoxia: Secondary | ICD-10-CM | POA: Diagnosis not present

## 2023-05-03 DIAGNOSIS — Z515 Encounter for palliative care: Secondary | ICD-10-CM | POA: Diagnosis not present

## 2023-05-03 MED ORDER — MORPHINE 100MG IN NS 100ML (1MG/ML) PREMIX INFUSION
1.0000 mg/h | INTRAVENOUS | Status: DC
  Administered 2023-05-03: 1 mg/h via INTRAVENOUS
  Administered 2023-05-05 – 2023-05-12 (×8): 4 mg/h via INTRAVENOUS
  Administered 2023-05-13 – 2023-05-17 (×6): 5 mg/h via INTRAVENOUS
  Administered 2023-05-18: 6 mg/h via INTRAVENOUS
  Filled 2023-05-03 (×16): qty 100

## 2023-05-03 MED ORDER — MORPHINE BOLUS VIA INFUSION
2.0000 mg | INTRAVENOUS | Status: DC | PRN
  Administered 2023-05-09 – 2023-05-13 (×4): 2 mg via INTRAVENOUS

## 2023-05-03 NOTE — Plan of Care (Signed)
  Problem: Education: Goal: Knowledge about tracheostomy care/management will improve Outcome: Progressing   Problem: Activity: Goal: Ability to tolerate increased activity will improve Outcome: Progressing   Problem: Clinical Measurements: Goal: Will remain free from infection Outcome: Progressing

## 2023-05-03 NOTE — Progress Notes (Signed)
Pt started on morphine drip, given bolus 2mg  per palliative RN. Raynelle Fanning RN, wean pt oxygen, pt tolerated well no changes at this time. Raynelle Fanning explain to the family that breathing pattern will change but is normal at this time.

## 2023-05-03 NOTE — Progress Notes (Signed)
Progress Note   Patient: Wendy Fleming:[id number] DOB: 08/04/1941 DOA: 03/23/2023     21 DOS: the patient was seen and examined on 05/03/2023   Brief hospital course: 82yo with hx anemia, htn who presented from home after being found unresponsive and vomiting. Workup demonstrated aneurysmal subarachnoid hemorrhage s/p coil embolization. Neurosurgery had been following. Pt was intubated for airway protection. During hospital course, it was felt unlikely she will return home with family support and minimal chance of being functionally independent. Decision was ultimately made with family for transition to comfort.  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 Morphine gtt started this AM. Family at bedside  Acute hypoxic respiratory failure -Initially intubated for airway protection -Later s/p trach on 9/15, now off O2 on room air with morphine gtt  Dysphagia  End of Life -Goals of care were addressed with pt transitioning to comfort measures at this time, per above -Palliative Care following. Anticipate transition to residential hospice Friday -Anticipated prognosis is <2 weeks  DNR -Comfort measures     Subjective: Unable to assess given mentation  Physical Exam: Vitals:   05/02/23 2016 05/03/23 0432 05/03/23 0807 05/03/23 0855  BP:    (!) 164/106  Pulse:  60  88  Resp:  (!) 26 (!) 27 18  Temp:    98.4 F (36.9 C)  TempSrc:    Axillary  SpO2: 96%   98%  Weight:      Height:       General exam: Awake, laying in bed, in nad Respiratory system: Normal respiratory effort, no wheezing   Data Reviewed:  There are no new results to review at this time.  Family Communication: Family at bedside  Disposition: Status is: Inpatient Remains  inpatient appropriate because: severity of illness  Planned Discharge Destination:  residential hospice     Author: Rickey Barbara, MD 05/03/2023 3:57 PM  For on call review [email or web address].

## 2023-05-03 NOTE — Progress Notes (Signed)
Rounded on pt family at bedside, pt is resting, breaths are labored. No secretion at this time. Will continue with plan of care.

## 2023-05-04 DIAGNOSIS — R4182 Altered mental status, unspecified: Secondary | ICD-10-CM | POA: Diagnosis not present

## 2023-05-04 DIAGNOSIS — Z930 Tracheostomy status: Secondary | ICD-10-CM | POA: Diagnosis not present

## 2023-05-04 DIAGNOSIS — Z515 Encounter for palliative care: Secondary | ICD-10-CM | POA: Diagnosis not present

## 2023-05-04 DIAGNOSIS — I619 Nontraumatic intracerebral hemorrhage, unspecified: Secondary | ICD-10-CM | POA: Diagnosis not present

## 2023-05-04 DIAGNOSIS — R4589 Other symptoms and signs involving emotional state: Secondary | ICD-10-CM

## 2023-05-04 DIAGNOSIS — J9601 Acute respiratory failure with hypoxia: Secondary | ICD-10-CM | POA: Diagnosis not present

## 2023-05-04 NOTE — Progress Notes (Signed)
Palliative Medicine Progress Note   [name]       Date: 05/04/2023 DOB: January 31, 1941  Age: 82 y.o. MRN#: [id number] Attending Physician: Jerald Kief, MD Primary Care Physician: Juluis Rainier, MD (Inactive) Admit Date: 04-20-23  Reason for Consultation/Follow-up: {Reason for Consult:23484}  HPI/Patient Profile: 82 y.o. female admitted to [facility] April 20, 2023 with ruptured Acomm who is s/p EVD by neurosurgery.  Hospital course has been complicated by respiratory failure with aspiration pneumonia.  She has been unable to be weaned from mechanical ventilation and consideration for tracheostomy.    PMT initially consulted on 9/14 and family elected to proceed with tracheostomy. She has declined further and now has transitioned to full comfort measures after discussion with neurosurgery and PCCM. PMT has been re-consulted on 9/18 for "comfort - likely residential hospice."  Subjective: Patient appears comfortable. Morphine infusion is at 1 mg/hr. She is unresponsive to voice and light touch. No non-verbal signs of pain or discomfort noted. Respirations are even and unlabored. No excessive respiratory secretions noted. Family present at bedside. Education and counseling provided on expectations at EOL. Emotional support provided.      Objective:  Physical Exam          Vital Signs: BP 125/63 (BP Location: Left Arm)   Pulse (!) 55   Temp 98.3 F (36.8 C) (Oral)   Resp 20   Ht 5\' 2"  (1.575 m)   Wt 84 kg   SpO2 97%   BMI 33.87 kg/m  SpO2: SpO2: 97 % O2 Device: O2 Device: Room Air O2 Flow Rate: O2 Flow Rate (L/min): 6 L/min  Intake/output summary: No intake or output data in the 24 hours ending 05/04/23 2111  LBM: Last BM Date : 05/03/23     Palliative Assessment/Data:  ***     Palliative Medicine Assessment & Plan   Assessment: Principal Problem:   ICH (intracerebral hemorrhage) (HCC) Active Problems:   Altered mental status   SAH (subarachnoid hemorrhage) (HCC)   Acute respiratory failure with hypoxia (HCC)   Aspiration pneumonia (HCC)   Tracheostomy in place [facility])   Goals of care, counseling/discussion    Recommendations/Plan: Continue comfort measures Continue morphine infusion Oxygen has been weaned off and is to remain off PMT will continue to support  Goals of Care and Additional Recommendations: Limitations on Scope  of Treatment: {Recommended Scope and Preferences:21019}  Code Status:   Prognosis:  {Palliative Care Prognosis:23504}  Discharge Planning: Anticipated Hospital Death  Care plan was discussed with ***  Thank you for allowing the Palliative Medicine Team to assist in the care of this patient.   ***   Merry Proud, NP   Please contact Palliative Medicine Team phone at [phone] for questions and concerns.  For individual providers, please see AMION.

## 2023-05-04 NOTE — Progress Notes (Signed)
Palliative Medicine Progress Note   [name]       Date: 05/04/2023 DOB: Mar 08, 1941  Age: 82 y.o. MRN#: [id number] Attending Physician: Jerald Kief, MD Primary Care Physician: Juluis Rainier, MD (Inactive) Admit Date: 03/28/2023    HPI/Patient Profile: 82 y.o. female admitted to [facility] 8/30 with ruptured Acomm who is s/p EVD by neurosurgery.  Hospital course has been complicated by respiratory failure with aspiration pneumonia.  She has been unable to be weaned from mechanical ventilation and consideration for tracheostomy.    PMT initially consulted on 9/14 and family elected to proceed with tracheostomy. She has declined further and now has transitioned to full comfort measures after discussion with neurosurgery and PCCM. PMT has been re-consulted on 9/18 for "comfort - likely residential hospice."   Subjective: I met with family at bedside and confirmed plan to start a morphine infusion for comfort and wean oxygen off so as not to prolong the dying process.  Coordinating care with patient's RN, I ordered the morphine infusion and then returned to bedside once it was started. Currently is at 1 mg/hr. Per my recommendation, RN administered a 2 mg bolus of morphine. I then turned the oxygen off and remained at bedside to assess patient and ensure her comfort at EOL. After several minutes, patient appears comfortable. No non-verbal signs of pain or discomfort noted. Respirations are even and unlabored. No excessive respiratory secretions noted. Discussed natural trajectory at EOL. Emotional support provided.     Objective:  Physical Exam Vitals reviewed.  Constitutional:      General: She is not in acute distress.    Appearance: She is ill-appearing.  Pulmonary:      Effort: No respiratory distress.     Comments: trach Neurological:     Mental Status: She is unresponsive.             Palliative Medicine Assessment & Plan   Assessment: Principal Problem:   ICH (intracerebral hemorrhage) (HCC) Active Problems:   Altered mental status   SAH (subarachnoid hemorrhage) (HCC)   Acute respiratory failure with hypoxia (HCC)   Aspiration pneumonia (HCC)   Tracheostomy in place [facility])   Goals of care, counseling/discussion    Recommendations/Plan: Continue comfort measures Start morphine infusion Oxygen has been weaned off and is to remain off PMT will  continue to support   Code Status: DNR - comfort   Prognosis:  Hours - Days  Discharge Planning: Anticipated Hospital Death    Thank you for allowing the Palliative Medicine Team to assist in the care of this patient.   MDM - High   Merry Proud, NP   Please contact Palliative Medicine Team phone at [phone] for questions and concerns.  For individual providers, please see AMION.

## 2023-05-04 NOTE — Progress Notes (Signed)
Progress Note   Patient: Wendy Fleming ZOX:[id number] DOB: 08/09/1941 DOA: 03/23/2023     22 DOS: the patient was seen and examined on 05/04/2023   Brief hospital course: 82yo with hx anemia, htn who presented from home after being found unresponsive and vomiting. Workup demonstrated aneurysmal subarachnoid hemorrhage s/p coil embolization. Neurosurgery had been following. Pt was intubated for airway protection. During hospital course, it was felt unlikely she will return home with family support and minimal chance of being functionally independent. Decision was ultimately made with family for transition to comfort.  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 Morphine gtt continued. Family at bedside. Pt appears comfortable  Acute hypoxic respiratory failure -Initially intubated for airway protection -Later s/p trach on 9/15, now off O2 on room air with morphine gtt  Dysphagia  End of Life -Goals of care were addressed with pt transitioning to comfort measures at this time, per above -Palliative Care following.  -Anticipated prognosis is <2 weeks  DNR -Comfort measures     Subjective: Cannot assess given mentation  Physical Exam: Vitals:   05/04/23 0308 05/04/23 0835 05/04/23 1220 05/04/23 1525  BP: (!) 131/90     Pulse: 82 68 65 70  Resp: 16 18 18 18   Temp: 98.5 F (36.9 C)     TempSrc: Oral     SpO2: 92% (!) 88% (!) 89% 92%  Weight:      Height:       General exam: Conversant, in no acute distress Respiratory system: normal chest rise, clear, no audible wheezing   Data Reviewed:  There are no new results to review at this time.  Family Communication: Family at bedside  Disposition: Status is: Inpatient Remains inpatient appropriate  because: severity of illness  Planned Discharge Destination:  residential hospice     Author: Rickey Barbara, MD 05/04/2023 4:41 PM  For on call review [email or web address].

## 2023-05-05 DIAGNOSIS — J9601 Acute respiratory failure with hypoxia: Secondary | ICD-10-CM | POA: Diagnosis not present

## 2023-05-05 DIAGNOSIS — I619 Nontraumatic intracerebral hemorrhage, unspecified: Secondary | ICD-10-CM | POA: Diagnosis not present

## 2023-05-05 DIAGNOSIS — Z515 Encounter for palliative care: Secondary | ICD-10-CM | POA: Diagnosis not present

## 2023-05-05 DIAGNOSIS — Z930 Tracheostomy status: Secondary | ICD-10-CM | POA: Diagnosis not present

## 2023-05-05 DIAGNOSIS — R4182 Altered mental status, unspecified: Secondary | ICD-10-CM | POA: Diagnosis not present

## 2023-05-05 MED ORDER — LORAZEPAM 2 MG/ML IJ SOLN
2.0000 mg | INTRAMUSCULAR | Status: DC | PRN
  Administered 2023-05-05 – 2023-05-17 (×4): 2 mg via INTRAVENOUS
  Filled 2023-05-05 (×4): qty 1

## 2023-05-05 MED ORDER — LORAZEPAM 2 MG/ML IJ SOLN
INTRAMUSCULAR | Status: AC
  Administered 2023-05-05: 2 mg via INTRAVENOUS
  Filled 2023-05-05: qty 1

## 2023-05-05 NOTE — Progress Notes (Signed)
Progress Note   Patient: Wendy Fleming [id number]:[id number] DOB: Jul 21, 1941 DOA: 04/02/2023     23 DOS: the patient was seen and examined on 05/05/2023   Brief hospital course: 82yo with hx anemia, htn who presented from home after being found unresponsive and vomiting. Workup demonstrated aneurysmal subarachnoid hemorrhage s/p coil embolization. Neurosurgery had been following. Pt was intubated for airway protection. During hospital course, it was felt unlikely she will return home with family support and minimal chance of being functionally independent. Decision was ultimately made with family for transition to comfort.  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 Morphine gtt continued. Family at bedside.  -Pt remains comfortable-appearing  Acute hypoxic respiratory failure -Initially intubated for airway protection -Later s/p trach on 9/15, now off O2 on room air with morphine gtt  Dysphagia  End of Life -Goals of care were addressed with pt transitioning to comfort measures at this time, per above -Palliative Care following.  -Anticipated prognosis is <2 weeks  DNR -Comfort measures     Subjective: Unable to assess given mentation  Physical Exam: Vitals:   05/05/23 0800 05/05/23 0812 05/05/23 1100 05/05/23 1554  BP:  122/60    Pulse: 87  73 (!) 44  Resp: 18  14 14   Temp:  98 F (36.7 C)    TempSrc:  Oral    SpO2: 97%  99% 97%  Weight:      Height:       General exam: Asleep, laying in bed, in nad Respiratory system: Normal respiratory effort, no wheezing   Data Reviewed:  There are no new results to review at this time.  Family Communication: Family at bedside  Disposition: Status is: Inpatient Remains inpatient appropriate because:  severity of illness  Planned Discharge Destination:  residential hospice     Author: Rickey Barbara, MD 05/05/2023 4:54 PM  For on call review [email or web address].

## 2023-05-05 NOTE — Plan of Care (Signed)
  Problem: Education: Goal: Knowledge of disease or condition will improve Outcome: Progressing Goal: Knowledge of secondary prevention will improve (MUST DOCUMENT ALL) Outcome: Progressing Goal: Knowledge of patient specific risk factors will improve [name] N/A or DELETE if not current risk factor) Outcome: Progressing   Problem: Spontaneous Subarachnoid Hemorrhage Tissue Perfusion: Goal: Complications of Spontaneous Subarachnoid Hemorrhage will be minimized Outcome: Progressing   Problem: Coping: Goal: Will verbalize positive feelings about self Outcome: Progressing Goal: Will identify appropriate support needs Outcome: Progressing   Problem: Health Behavior/Discharge Planning: Goal: Ability to manage health-related needs will improve Outcome: Progressing Goal: Goals will be collaboratively established with patient/family Outcome: Progressing   Problem: Self-Care: Goal: Ability to participate in self-care as condition permits will improve Outcome: Progressing Goal: Verbalization of feelings and concerns over difficulty with self-care will improve Outcome: Progressing Goal: Ability to communicate needs accurately will improve Outcome: Progressing   Problem: Nutrition: Goal: Risk of aspiration will decrease Outcome: Progressing Goal: Dietary intake will improve Outcome: Progressing   Problem: Activity: Goal: Ability to tolerate increased activity will improve Outcome: Progressing   Problem: Respiratory: Goal: Ability to maintain a clear airway and adequate ventilation will improve Outcome: Progressing   Problem: Role Relationship: Goal: Method of communication will improve Outcome: Progressing   Problem: Education: Goal: Knowledge about tracheostomy care/management will improve Outcome: Progressing   Problem: Activity: Goal: Ability to tolerate increased activity will improve Outcome: Progressing   Problem: Health Behavior/Discharge Planning: Goal: Ability to  manage tracheostomy will improve Outcome: Progressing   Problem: Respiratory: Goal: Patent airway maintenance will improve Outcome: Progressing   Problem: Health Behavior/Discharge Planning: Goal: Ability to manage tracheostomy will improve Outcome: Progressing   Problem: Respiratory: Goal: Patent airway maintenance will improve Outcome: Progressing   Problem: Role Relationship: Goal: Ability to communicate will improve Outcome: Progressing   Problem: Education: Goal: Knowledge of General Education information will improve Description: Including pain rating scale, medication(s)/side effects and non-pharmacologic comfort measures Outcome: Progressing   Problem: Health Behavior/Discharge Planning: Goal: Ability to manage health-related needs will improve Outcome: Progressing   Problem: Clinical Measurements: Goal: Ability to maintain clinical measurements within normal limits will improve Outcome: Progressing Goal: Will remain free from infection Outcome: Progressing

## 2023-05-05 NOTE — Progress Notes (Signed)
Palliative Medicine Progress Note   Patient Name: TONOA VARS       Date: 05/05/2023 DOB: [birth year]  Age: 82 y.o. MRN#: [id number] Attending Physician: Jerald Kief, MD Primary Care Physician: Juluis Rainier, MD (Inactive) Admit Date: 04/13/2023  Reason for Consultation/Follow-up: {Reason for Consult:23484}  HPI/Patient Profile: 82 y.o. female admitted to [facility] 8/30 with ruptured Acomm who is s/p EVD by neurosurgery.  Hospital course has been complicated by respiratory failure with aspiration pneumonia.  She has been unable to be weaned from mechanical ventilation and consideration for tracheostomy.    PMT initially consulted on 9/14 and family elected to proceed with tracheostomy. She has declined further and now has transitioned to full comfort measures after discussion with neurosurgery and PCCM. PMT has been re-consulted on 9/18 for "comfort - likely residential hospice."  Subjective: ***  Objective:  Physical Exam          Vital Signs: BP 122/60 (BP Location: Left Arm)   Pulse (!) 44   Temp 98 F (36.7 C) (Oral)   Resp 14   Ht 5\' 2"  (1.575 m)   Wt 84 kg   SpO2 97%   BMI 33.87 kg/m  SpO2: SpO2: 97 % O2 Device: O2 Device: Room Air O2 Flow Rate: O2 Flow Rate (L/min): 6 L/min  Intake/output summary: No intake or output data in the 24 hours ending 05/05/23 1615  LBM: Last BM Date : 05/03/23     Palliative Assessment/Data: ***     Palliative Medicine Assessment & Plan   Assessment: Principal Problem:   ICH (intracerebral hemorrhage) (HCC) Active Problems:   Altered mental status   SAH (subarachnoid hemorrhage) (HCC)   Acute respiratory failure with hypoxia (HCC)   Aspiration pneumonia (HCC)   Tracheostomy in place (HCC)   Goals of care,  counseling/discussion    Recommendations/Plan: ***  Goals of Care and Additional Recommendations: Limitations on Scope of Treatment: {Recommended Scope and Preferences:21019}  Code Status:   Prognosis:  {Palliative Care Prognosis:23504}  Discharge Planning: {Palliative dispostion:23505}  Care plan was discussed with ***  Thank you for allowing the Palliative Medicine Team to assist in the care of this patient.   ***   Merry Proud, NP   Please contact Palliative Medicine Team phone at [phone] for questions and concerns.  For individual providers, please  see AMION.

## 2023-05-06 DIAGNOSIS — Z7189 Other specified counseling: Secondary | ICD-10-CM | POA: Diagnosis not present

## 2023-05-06 DIAGNOSIS — Z515 Encounter for palliative care: Secondary | ICD-10-CM | POA: Diagnosis not present

## 2023-05-06 DIAGNOSIS — I609 Nontraumatic subarachnoid hemorrhage, unspecified: Secondary | ICD-10-CM | POA: Diagnosis not present

## 2023-05-06 DIAGNOSIS — R4182 Altered mental status, unspecified: Secondary | ICD-10-CM | POA: Diagnosis not present

## 2023-05-06 NOTE — Plan of Care (Signed)
  Problem: Pain Managment: Goal: General experience of comfort will improve Outcome: Progressing   Problem: Safety: Goal: Ability to remain free from injury will improve Outcome: Progressing   Problem: Skin Integrity: Goal: Risk for impaired skin integrity will decrease Outcome: Progressing   

## 2023-05-06 NOTE — Progress Notes (Signed)
Daily Progress Note   [name]       Date: 05/06/2023 DOB: [birth year]  Age: 82 y.o. MRN#: [id number] Attending Physician: Jerald Kief, MD Primary Care Physician: Juluis Rainier, MD (Inactive) Admit Date: 2023/04/20  Reason for Consultation/Follow-up: Establishing goals of care and Terminal Care  Subjective: Medical records reviewed including progress notes, labs, and imaging. Patient assessed at the bedside. She is comfortable on 4mg /hr morphine gtt. Three family members present visiting.  We discussed the past few days since our last conversation and family's close watch on patient's comfort and end of life journey. Emotional support and therapeutic listening was provided.   Questions and concerns addressed. PMT will continue to support holistically.   Length of Stay: 24   Physical Exam Vitals and nursing note reviewed.  Constitutional:      General: She is not in acute distress. Cardiovascular:     Rate and Rhythm: Normal rate.  Pulmonary:     Effort: Pulmonary effort is normal. No respiratory distress.  Skin:    General: Skin is warm and dry.  Neurological:     Mental Status: She is unresponsive.            Vital Signs: BP (!) 107/57 (BP Location: Left Arm)   Pulse 89   Temp 98.7 F (37.1 C) (Oral)   Resp 18   Ht 5\' 2"  (1.575 m)   Wt 84 kg   SpO2 100%   BMI 33.87 kg/m  SpO2: SpO2: 100 % O2 Device: O2 Device: Tracheostomy Collar O2 Flow Rate: O2 Flow Rate (L/min): 6 L/min      Palliative Assessment/Data: 10%   Palliative Care Assessment & Plan   Patient Profile: 82 y.o. female admitted to [facility] 04-20-23 with ruptured Acomm who is s/p EVD by neurosurgery.  Hospital course has been complicated by respiratory failure with aspiration pneumonia.   She has been unable to be weaned from mechanical ventilation and consideration for tracheostomy.   PMT initially consulted on 9/14 and family elected to proceed with tracheostomy. She has declined further and now has transitioned to full comfort measures after discussion with neurosurgery and PCCM. PMT has been re-consulted on 9/18 for "comfort - likely residential hospice."  Assessment: End of life care  Recommendations/Plan: Continued DNR/DNI Continue comfort-focused care, no adjustments required today  No oxygen, give morphine boluses PRN for air hunger PMT will continue to follow and support   Prognosis:  Hours - Days  Discharge Planning: Anticipated Hospital Death  Care plan was discussed with patient's family   MDM high         Alan Drummer Jeni Salles, PA-C  Palliative Medicine Team Team phone # [phone]  Thank you for allowing the Palliative Medicine Team to assist in the care of this patient. Please utilize secure chat with additional questions, if there is no response within 30 minutes please call the above phone number.  Palliative Medicine Team providers are available by phone from 7am to 7pm daily and can be reached through the team cell phone.  Should this patient require assistance outside of these hours, please call the patient's attending physician.

## 2023-05-06 NOTE — Progress Notes (Addendum)
Progress Note   Patient: Wendy Fleming ZOX:[id number] DOB: 05-21-1941 DOA: 04/02/2023     24 DOS: the patient was seen and examined on 05/06/2023   Brief hospital course: 82yo with hx anemia, htn who presented from home after being found unresponsive and vomiting. Workup demonstrated aneurysmal subarachnoid hemorrhage s/p coil embolization. Neurosurgery had been following. Pt was intubated for airway protection. During hospital course, it was felt unlikely she will return home with family support and minimal chance of being functionally independent. Decision was ultimately made with family for transition to comfort.  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 Morphine gtt continued. Family remains at bedside.  -Pt remains comfortable-appearing -Per Palliative Care, anticipate hospital death  Acute hypoxic respiratory failure -Initially intubated for airway protection -Later s/p trach on 9/15, now off O2 on room air with morphine gtt  Dysphagia  End of Life -Goals of care were addressed with pt transitioning to comfort measures at this time, per above -Palliative Care following.  -Anticipated prognosis is <2 weeks  DNR -Comfort measures     Subjective: Cannot assess given mentation  Physical Exam: Vitals:   05/06/23 0859 05/06/23 0913 05/06/23 1131 05/06/23 1634  BP:  (!) 107/57    Pulse: 91 89 88 97  Resp: 15 18 15 14   Temp:  98.7 F (37.1 C)    TempSrc:  Oral    SpO2: (!) 79% 100% (!) 85% (!) 85%  Weight:      Height:       General exam: Not conversant, in no acute distress Respiratory system: normal chest rise, clear, no audible wheezing   Data Reviewed:  There are no new results to review at this time.  Family Communication: Family at  bedside  Disposition: Status is: Inpatient Remains inpatient appropriate because: severity of illness  Planned Discharge Destination:  hospital death     Author: Rickey Barbara, MD 05/06/2023 4:39 PM  For on call review [email or web address].

## 2023-05-07 DIAGNOSIS — R4182 Altered mental status, unspecified: Secondary | ICD-10-CM | POA: Diagnosis not present

## 2023-05-07 DIAGNOSIS — Z515 Encounter for palliative care: Secondary | ICD-10-CM | POA: Diagnosis not present

## 2023-05-07 DIAGNOSIS — Z7189 Other specified counseling: Secondary | ICD-10-CM | POA: Diagnosis not present

## 2023-05-07 DIAGNOSIS — I609 Nontraumatic subarachnoid hemorrhage, unspecified: Secondary | ICD-10-CM | POA: Diagnosis not present

## 2023-05-07 NOTE — Progress Notes (Signed)
Progress Note   Patient: Wendy Fleming JYN:[id number] DOB: 01-27-1941 DOA: 03/19/2023     25 DOS: the patient was seen and examined on 05/07/2023   Brief hospital course: 82yo with hx anemia, htn who presented from home after being found unresponsive and vomiting. Workup demonstrated aneurysmal subarachnoid hemorrhage s/p coil embolization. Neurosurgery had been following. Pt was intubated for airway protection. During hospital course, it was felt unlikely she will return home with family support and minimal chance of being functionally independent. Decision was ultimately made with family for transition to comfort.  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 Morphine gtt continued. Family remains at bedside.  -Pt remains comfortable-appearing -Palliative Care following. Anticipating hospital death  Acute hypoxic respiratory failure -Initially intubated for airway protection -Later s/p trach on 9/15, now off O2 on room air with morphine gtt  Dysphagia  End of Life -Goals of care were addressed with pt transitioning to comfort measures at this time, per above -Palliative Care following.  -Anticipated prognosis is <2 weeks  DNR -Comfort measures     Subjective: Unable to assess given mentation  Physical Exam: Vitals:   05/07/23 0319 05/07/23 0830 05/07/23 1151 05/07/23 1532  BP:      Pulse: 80 74 91 85  Resp: 14 15 14 13   Temp:      TempSrc:      SpO2: (!) 83% (!) 87% (!) 73% (!) 81%  Weight:      Height:       General exam: laying in bed, in nad Respiratory system: Normal respiratory effort, no wheezing   Data Reviewed:  There are no new results to review at this time.  Family Communication: Family at bedside  Disposition: Status is:  Inpatient Remains inpatient appropriate because: severity of illness  Planned Discharge Destination:  hospital death     Author: Rickey Barbara, MD 05/07/2023 5:35 PM  For on call review [email or web address].

## 2023-05-07 NOTE — Plan of Care (Signed)
  Problem: Education: Goal: Knowledge of disease or condition will improve Outcome: Progressing Goal: Knowledge of secondary prevention will improve (MUST DOCUMENT ALL) Outcome: Progressing Goal: Knowledge of patient specific risk factors will improve [name] N/A or DELETE if not current risk factor) Outcome: Progressing   Problem: Spontaneous Subarachnoid Hemorrhage Tissue Perfusion: Goal: Complications of Spontaneous Subarachnoid Hemorrhage will be minimized Outcome: Progressing   Problem: Coping: Goal: Will verbalize positive feelings about self Outcome: Progressing Goal: Will identify appropriate support needs Outcome: Progressing   Problem: Health Behavior/Discharge Planning: Goal: Ability to manage health-related needs will improve Outcome: Progressing Goal: Goals will be collaboratively established with patient/family Outcome: Progressing   Problem: Self-Care: Goal: Ability to participate in self-care as condition permits will improve Outcome: Progressing Goal: Verbalization of feelings and concerns over difficulty with self-care will improve Outcome: Progressing Goal: Ability to communicate needs accurately will improve Outcome: Progressing   Problem: Nutrition: Goal: Risk of aspiration will decrease Outcome: Progressing Goal: Dietary intake will improve Outcome: Progressing   Problem: Activity: Goal: Ability to tolerate increased activity will improve Outcome: Progressing   Problem: Respiratory: Goal: Ability to maintain a clear airway and adequate ventilation will improve Outcome: Progressing   Problem: Role Relationship: Goal: Method of communication will improve Outcome: Progressing   Problem: Education: Goal: Knowledge about tracheostomy care/management will improve Outcome: Progressing   Problem: Activity: Goal: Ability to tolerate increased activity will improve Outcome: Progressing   Problem: Health Behavior/Discharge Planning: Goal: Ability to  manage tracheostomy will improve Outcome: Progressing   Problem: Respiratory: Goal: Patent airway maintenance will improve Outcome: Progressing   Problem: Health Behavior/Discharge Planning: Goal: Ability to manage tracheostomy will improve Outcome: Progressing   Problem: Respiratory: Goal: Patent airway maintenance will improve Outcome: Progressing   Problem: Role Relationship: Goal: Ability to communicate will improve Outcome: Progressing   Problem: Education: Goal: Knowledge of General Education information will improve Description: Including pain rating scale, medication(s)/side effects and non-pharmacologic comfort measures Outcome: Progressing   Problem: Health Behavior/Discharge Planning: Goal: Ability to manage health-related needs will improve Outcome: Progressing   Problem: Clinical Measurements: Goal: Ability to maintain clinical measurements within normal limits will improve Outcome: Progressing Goal: Will remain free from infection Outcome: Progressing Goal: Diagnostic test results will improve Outcome: Progressing Goal: Respiratory complications will improve Outcome: Progressing Goal: Cardiovascular complication will be avoided Outcome: Progressing   Problem: Activity: Goal: Risk for activity intolerance will decrease Outcome: Progressing   Problem: Nutrition: Goal: Adequate nutrition will be maintained Outcome: Progressing   Problem: Coping: Goal: Level of anxiety will decrease Outcome: Progressing   Problem: Elimination: Goal: Will not experience complications related to bowel motility Outcome: Progressing Goal: Will not experience complications related to urinary retention Outcome: Progressing   Problem: Pain Managment: Goal: General experience of comfort will improve Outcome: Progressing   Problem: Safety: Goal: Ability to remain free from injury will improve Outcome: Progressing   Problem: Skin Integrity: Goal: Risk for impaired  skin integrity will decrease Outcome: Progressing

## 2023-05-07 NOTE — Progress Notes (Signed)
Daily Progress Note   [name]       Date: 05/07/2023 DOB: 1941-02-27  Age: 82 y.o. MRN#: [id number] Attending Physician: Jerald Kief, MD Primary Care Physician: Juluis Rainier, MD (Inactive) Admit Date: 04/04/2023  Reason for Consultation/Follow-up: Establishing goals of care and Terminal Care  Subjective: Medical records reviewed including MAR, progress notes. Patient assessed at the bedside. Breathing is irregular with periods of apnea on room air. Several family members present visiting. Emotional support and therapeutic listening was provided.  Length of Stay: 25   Physical Exam Vitals and nursing note reviewed.  Constitutional:      General: She is not in acute distress. Cardiovascular:     Rate and Rhythm: Normal rate.  Pulmonary:     Effort: Pulmonary effort is normal. No respiratory distress.  Skin:    General: Skin is warm and dry.  Neurological:     Mental Status: She is unresponsive.            Vital Signs: BP (!) 107/57 (BP Location: Left Arm)   Pulse 74   Temp 98.7 F (37.1 C) (Oral)   Resp 15   Ht 5\' 2"  (1.575 m)   Wt 84 kg   SpO2 (!) 87%   BMI 33.87 kg/m  SpO2: SpO2: (!) 87 % O2 Device: O2 Device: Room Air O2 Flow Rate: O2 Flow Rate (L/min): 6 L/min      Palliative Assessment/Data: 10%   Palliative Care Assessment & Plan   Patient Profile: 82 y.o. female admitted to [facility] 8/30 with ruptured Acomm who is s/p EVD by neurosurgery.  Hospital course has been complicated by respiratory failure with aspiration pneumonia.  She has been unable to be weaned from mechanical ventilation and consideration for tracheostomy.   PMT initially consulted on 9/14 and family elected to proceed with tracheostomy. She has declined further and now  has transitioned to full comfort measures after discussion with neurosurgery and PCCM. PMT has been re-consulted on 9/18 for "comfort - likely residential hospice."  Assessment: End of life care  Recommendations/Plan: Continued DNR/DNI Continue comfort-focused care, no adjustments required to opioid infusion today No oxygen, give morphine boluses PRN for air hunger PMT will continue to follow and support   Prognosis:  Hours - Days  Discharge Planning: Anticipated Hospital Death  Care plan was discussed with patient's family   MDM high         Fallou Hulbert Jeni Salles, PA-C  Palliative Medicine Team Team phone # [phone]  Thank you for allowing the Palliative Medicine Team to assist in the care of this patient. Please utilize secure chat with additional questions, if there is no response within 30 minutes please call the above phone number.  Palliative Medicine Team providers are available by phone from 7am to 7pm daily and can be reached through the team cell phone.  Should this patient require assistance outside of these hours, please call the patient's attending physician.

## 2023-05-08 DIAGNOSIS — I619 Nontraumatic intracerebral hemorrhage, unspecified: Secondary | ICD-10-CM | POA: Diagnosis not present

## 2023-05-08 DIAGNOSIS — Z7189 Other specified counseling: Secondary | ICD-10-CM | POA: Diagnosis not present

## 2023-05-08 DIAGNOSIS — Z66 Do not resuscitate: Secondary | ICD-10-CM | POA: Diagnosis not present

## 2023-05-08 DIAGNOSIS — R4182 Altered mental status, unspecified: Secondary | ICD-10-CM | POA: Diagnosis not present

## 2023-05-08 DIAGNOSIS — Z515 Encounter for palliative care: Secondary | ICD-10-CM | POA: Diagnosis not present

## 2023-05-08 NOTE — Plan of Care (Signed)
  Problem: Nutrition: Goal: Risk of aspiration will decrease Outcome: Progressing   Problem: Respiratory: Goal: Patent airway maintenance will improve Outcome: Progressing   Problem: Respiratory: Goal: Patent airway maintenance will improve Outcome: Progressing   Problem: Clinical Measurements: Goal: Will remain free from infection Outcome: Progressing   Problem: Elimination: Goal: Will not experience complications related to urinary retention Outcome: Progressing   Problem: Pain Managment: Goal: General experience of comfort will improve Outcome: Progressing   Problem: Safety: Goal: Ability to remain free from injury will improve Outcome: Progressing   Problem: Skin Integrity: Goal: Risk for impaired skin integrity will decrease Outcome: Progressing

## 2023-05-08 NOTE — Plan of Care (Signed)
  Problem: Education: Goal: Knowledge of disease or condition will improve Outcome: Progressing Goal: Knowledge of secondary prevention will improve (MUST DOCUMENT ALL) Outcome: Progressing Goal: Knowledge of patient specific risk factors will improve [name] N/A or DELETE if not current risk factor) Outcome: Progressing   Problem: Spontaneous Subarachnoid Hemorrhage Tissue Perfusion: Goal: Complications of Spontaneous Subarachnoid Hemorrhage will be minimized Outcome: Progressing   Problem: Coping: Goal: Will verbalize positive feelings about self Outcome: Progressing Goal: Will identify appropriate support needs Outcome: Progressing   Problem: Health Behavior/Discharge Planning: Goal: Ability to manage health-related needs will improve Outcome: Progressing Goal: Goals will be collaboratively established with patient/family Outcome: Progressing   Problem: Self-Care: Goal: Ability to participate in self-care as condition permits will improve Outcome: Progressing Goal: Verbalization of feelings and concerns over difficulty with self-care will improve Outcome: Progressing Goal: Ability to communicate needs accurately will improve Outcome: Progressing   Problem: Nutrition: Goal: Risk of aspiration will decrease Outcome: Progressing Goal: Dietary intake will improve Outcome: Progressing   Problem: Activity: Goal: Ability to tolerate increased activity will improve Outcome: Progressing   Problem: Respiratory: Goal: Ability to maintain a clear airway and adequate ventilation will improve Outcome: Progressing   Problem: Role Relationship: Goal: Method of communication will improve Outcome: Progressing   Problem: Education: Goal: Knowledge about tracheostomy care/management will improve Outcome: Progressing   Problem: Activity: Goal: Ability to tolerate increased activity will improve Outcome: Progressing   Problem: Health Behavior/Discharge Planning: Goal: Ability to  manage tracheostomy will improve Outcome: Progressing   Problem: Respiratory: Goal: Patent airway maintenance will improve Outcome: Progressing   Problem: Health Behavior/Discharge Planning: Goal: Ability to manage tracheostomy will improve Outcome: Progressing   Problem: Respiratory: Goal: Patent airway maintenance will improve Outcome: Progressing   Problem: Role Relationship: Goal: Ability to communicate will improve Outcome: Progressing   Problem: Education: Goal: Knowledge of General Education information will improve Description: Including pain rating scale, medication(s)/side effects and non-pharmacologic comfort measures Outcome: Progressing   Problem: Health Behavior/Discharge Planning: Goal: Ability to manage health-related needs will improve Outcome: Progressing   Problem: Clinical Measurements: Goal: Ability to maintain clinical measurements within normal limits will improve Outcome: Progressing Goal: Will remain free from infection Outcome: Progressing Goal: Diagnostic test results will improve Outcome: Progressing Goal: Respiratory complications will improve Outcome: Progressing Goal: Cardiovascular complication will be avoided Outcome: Progressing   Problem: Activity: Goal: Risk for activity intolerance will decrease Outcome: Progressing   Problem: Nutrition: Goal: Adequate nutrition will be maintained Outcome: Progressing   Problem: Coping: Goal: Level of anxiety will decrease Outcome: Progressing   Problem: Elimination: Goal: Will not experience complications related to bowel motility Outcome: Progressing Goal: Will not experience complications related to urinary retention Outcome: Progressing   Problem: Pain Managment: Goal: General experience of comfort will improve Outcome: Progressing   Problem: Safety: Goal: Ability to remain free from injury will improve Outcome: Progressing   Problem: Skin Integrity: Goal: Risk for impaired  skin integrity will decrease Outcome: Progressing

## 2023-05-08 NOTE — Progress Notes (Signed)
Daily Progress Note   [name]       Date: 05/08/2023 DOB: 10-17-1940  Age: 82 y.o. MRN#: [id number] Attending Physician: Leatha Gilding, MD Primary Care Physician: Juluis Rainier, MD (Inactive) Admit Date: 03/27/2023 Length of Stay: 26 days  Reason for Consultation/Follow-up: Terminal Care  HPI/Patient Profile:  82 y.o. female admitted to [facility] 8/30 with ruptured Acomm who is s/p EVD by neurosurgery.  Hospital course has been complicated by respiratory failure with aspiration pneumonia.  She has been unable to be weaned from mechanical ventilation and consideration for tracheostomy.    PMT initially consulted on 9/14 and family elected to proceed with tracheostomy. She has declined further and now has transitioned to full comfort measures after discussion with neurosurgery and PCCM. PMT has been re-consulted on 9/18 for "comfort - likely residential hospice."  Subjective:   Subjective: Chart Reviewed. Updates received. Patient Assessed. Created space and opportunity for patient  and family to explore thoughts and feelings regarding current medical situation.  Today's Discussion: Today saw the patient at the bedside with 2 family members present.  Patient remains on morphine drip at 4 mg/h.  I discussed with the family the continued plan for comfort care.  They state that she does not seem to be in any distress or pain/discomfort.  At this point she is not conscious and not communicating.  I provided support to the patient's family and expressed her feelings and continued support and ensured that they had the palliative medicine contact information for any needs.  I informed them that if at any point their mom seems to be uncomfortable to let the nurse know they are planning and medications available to assist.  I informed them that palliative medicine would follow-up daily while she is in the hospital.  I provided emotional and general support through therapeutic  listening, empathy, sharing of stories, and other techniques. I answered all questions and addressed all concerns to the best of my ability.  Review of Systems  Unable to perform ROS: Acuity of condition    Objective:   Vital Signs:  BP (!) 107/57 (BP Location: Left Arm)   Pulse 96   Temp 98.7 F (37.1 C) (Oral)   Resp 16   Ht 5\' 2"  (1.575 m)   Wt 84 kg   SpO2 (!) 68%   BMI 33.87 kg/m   Physical Exam: Physical Exam Vitals and nursing note reviewed.  Constitutional:      General: She is sleeping. She is not in acute distress. Cardiovascular:     Rate and Rhythm: Tachycardia present.  Pulmonary:     Effort: Pulmonary effort is normal. No respiratory distress.     Comments: Intermittent periods of apnea noted Abdominal:     General: Abdomen is flat.     Palpations: Abdomen is soft.  Skin:    General: Skin is warm and dry.     Palliative Assessment/Data: 10%    Existing Vynca/ACP Documentation: None  Assessment & Plan:   Impression: Present on Admission:  ICH (intracerebral hemorrhage) (HCC)  SUMMARY OF RECOMMENDATIONS   Continued DNR-comfort Continue comfort care See symptom management orders below No adjustments required to opioid infusion today Continued emotional support of patient and family PMT will continue to follow  Symptom Management:  Tylenol 650 mg PR every 6 hours as needed pain or fever Robinul 0.4 mg IV every 4 hours as needed excessive secretions Haldol 2.5-5 mg IV every 4 hours as needed agitation Ativan 2 mg  IV every 4 hours as needed anxiety or fear Morphine infusion 1 to 10 mg/h titrate for pain or dyspnea Morphine bolus 2 mg IV every 15 minutes as needed severe pain or dyspnea Zofran 4 mg IV every 6 hours as needed nausea Polyvinyl alcohol 1.4% ophthalmic 1 drop OU 4 times daily as needed dry eyes  Code Status: DNR-comfort  Prognosis: Hours - Days  Discharge Planning: Anticipated Hospital Death  Discussed with: Patient's  family, medical team, nursing team  Thank you for allowing [place] to participate in the care of [name] PMT will continue to support holistically.  Time Total: 35 min  Detailed review of medical records (labs, imaging, vital signs), medically appropriate exam, discussed with treatment team, counseling and education to patient, family, & staff, documenting clinical information, medication management, coordination of care  Wynne Dust, NP Palliative Medicine Team  Team Phone # [phone] (Nights/Weekends)  04/11/2021, 8:17 AM

## 2023-05-08 NOTE — Progress Notes (Signed)
PROGRESS NOTE  Wendy Fleming ZOX:[id number] DOB: [birth year] DOA: 05-06-2023 PCP: Juluis Rainier, MD (Inactive)   LOS: 26 days   Brief Narrative / Interim history: 82yo with hx anemia, htn who presented from home after being found unresponsive and vomiting. Workup demonstrated aneurysmal subarachnoid hemorrhage s/p coil embolization. Neurosurgery had been following. Pt was intubated for airway protection. During hospital course, it was felt unlikely she will return home with family support and minimal chance of being functionally independent. Decision was ultimately made with family for transition to comfort.   Subjective / 24h Interval events: Unresponsive.  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 Per Neurosurgery, improvement to the point of interactivity is highly unlikely. Decision was made to transition to comfort measures.  Palliative care following, anticipate hospital death   Active problems Acute hypoxic respiratory failure -Initially intubated for airway protection. Later s/p trach on 9/15, now off O2 on room air with morphine gtt   Dysphagia - noted   End of Life -Goals of care were addressed with pt transitioning to comfort measures at this time, per above   DNR -Comfort measures  Scheduled Meds:  sodium chloride flush  10-40 mL Intracatheter Q12H   Continuous Infusions:  sodium chloride Stopped (04/18/23 1050)   morphine 4 mg/hr (05/08/23 0259)   PRN Meds:.acetaminophen **OR** acetaminophen, [DISCONTINUED] glycopyrrolate **OR** [DISCONTINUED] glycopyrrolate **OR**  glycopyrrolate, haloperidol lactate, LORazepam, morphine injection, morphine, [DISCONTINUED] ondansetron **OR** ondansetron (ZOFRAN) IV, mouth rinse, polyvinyl alcohol, sodium chloride flush  Current Outpatient Medications  Medication Instructions   acetaminophen (TYLENOL) 650 mg, Oral, Every 6 hours PRN   cholecalciferol (VITAMIN D) 1,000 Units, Oral, BH-each morning   Glucosamine HCl (GLUCOSAMINE PO) Oral   Multiple Vitamin (MULTIVITAMIN WITH MINERALS) TABS 1 tablet, Oral, BH-each morning   Naproxen Sodium (ALEVE) 220 MG CAPS 1 capsule, Oral, Once PRN   PRESCRIPTION MEDICATION 1 application , 2 times weekly   trandolapril (MAVIK) 4 mg, Oral, BH-each morning    Diet Orders (From admission, onward)     Start     Ordered   04/28/23 0001  Diet NPO time specified  (Pre Percutaneous Dilitation Tracheostomy)  Diet effective midnight       Comments: Clarify time of tube feed discontinuation with provider.   04/27/23 1736            DVT prophylaxis:    Lab Results  Component Value Date   PLT 344 04/29/2023      Code Status: Do not attempt resuscitation (DNR) - Comfort care  Family Communication: family at bedside   Status is: Inpatient Remains inpatient appropriate because: anticipate in hospital death   Level of care: Palliative Care  Consultants:  Neurosurgery  Palliative  Objective: Vitals:   05/07/23 1151 05/07/23 1532 05/07/23 1918 05/08/23 0824  BP:      Pulse: 91 85 88 (!) 105  Resp: 14 13 14 15   Temp:      TempSrc:      SpO2: (!) 73% (!) 81% (!) 78% (!) 73%  Weight:      Height:        Intake/Output Summary (Last 24 hours) at 05/08/2023  1016 Last data filed at 05/08/2023 0300 Gross per 24 hour  Intake 99.44 ml  Output 500 ml  Net -400.56 ml   Wt Readings from Last 3 Encounters:  04/30/23 84 kg  06/07/14 81.6 kg  06/07/12 83.9 kg    Examination:  Constitutional: NAD  Data Reviewed: I have independently reviewed following labs and imaging  studies   CBC No results for input(s): "WBC", "HGB", "HCT", "PLT", "MCV", "MCH", "MCHC", "RDW", "LYMPHSABS", "MONOABS", "EOSABS", "BASOSABS", "BANDABS" in the last 168 hours.  Invalid input(s): "NEUTRABS", "BANDSABD"  No results for input(s): "NA", "K", "CL", "CO2", "GLUCOSE", "BUN", "CREATININE", "CALCIUM", "AST", "ALT", "ALKPHOS", "BILITOT", "ALBUMIN", "MG", "CRP", "DDIMER", "PROCALCITON", "LATICACIDVEN", "INR", "TSH", "CORTISOL", "HGBA1C", "AMMONIA", "BNP" in the last 168 hours.  Invalid input(s): "GFRCGP", "PHOSPHOROUS"  ------------------------------------------------------------------------------------------------------------------ No results for input(s): "CHOL", "HDL", "LDLCALC", "TRIG", "CHOLHDL", "LDLDIRECT" in the last 72 hours.  No results found for: "HGBA1C" ------------------------------------------------------------------------------------------------------------------ No results for input(s): "TSH", "T4TOTAL", "T3FREE", "THYROIDAB" in the last 72 hours.  Invalid input(s): "FREET3"  Cardiac Enzymes No results for input(s): "CKMB", "TROPONINI", "MYOGLOBIN" in the last 168 hours.  Invalid input(s): "CK" ------------------------------------------------------------------------------------------------------------------    Component Value Date/Time   BNP 322.1 (H) 04/26/2023 2330    CBG: No results for input(s): "GLUCAP" in the last 168 hours.  No results found for this or any previous visit (from the past 240 hour(s)).   Radiology Studies: No results found.   Pamella Pert, MD, PhD [facility]  Between 7 am - 7 pm I am available, please contact me via Amion (for emergencies) or Securechat (non urgent messages)  Between 7 pm - 7 am I am not available, please contact night coverage MD/APP via Amion

## 2023-05-09 DIAGNOSIS — Z515 Encounter for palliative care: Secondary | ICD-10-CM | POA: Diagnosis not present

## 2023-05-09 DIAGNOSIS — I619 Nontraumatic intracerebral hemorrhage, unspecified: Secondary | ICD-10-CM | POA: Diagnosis not present

## 2023-05-09 DIAGNOSIS — R4182 Altered mental status, unspecified: Secondary | ICD-10-CM | POA: Diagnosis not present

## 2023-05-09 DIAGNOSIS — I609 Nontraumatic subarachnoid hemorrhage, unspecified: Secondary | ICD-10-CM | POA: Diagnosis not present

## 2023-05-09 DIAGNOSIS — Z7189 Other specified counseling: Secondary | ICD-10-CM | POA: Diagnosis not present

## 2023-05-09 NOTE — Plan of Care (Signed)
Family at bedside, pt is resting comfortably in bed at present.

## 2023-05-09 NOTE — Progress Notes (Signed)
Daily Progress Note   [name]       Date: 05/09/2023 DOB: 1941/06/01  Age: 82 y.o. MRN#: [id number] Attending Physician: Leatha Gilding, MD Primary Care Physician: Juluis Rainier, MD (Inactive) Admit Date: 03/31/2023  Reason for Consultation/Follow-up: Establishing goals of care and Terminal Care  Subjective: Medical records reviewed including MAR, progress notes. Patient assessed at the bedside. Breathing is irregular with periods of apnea on room air. Several family members present visiting including sister, niece, great nieces.   Emotional support and therapeutic listening was provided as they reflected on the challenges patient has faced in her life.  We briefly reviewed patient's hospital course and concern for quality of life and new baseline even after continued aggressive medical care.  Questions and concerns addressed. PMT will continue to follow and support holistically.  Length of Stay: 55   Physical Exam Vitals and nursing note reviewed.  Constitutional:      General: Wendy Fleming is not in acute distress. Cardiovascular:     Rate and Rhythm: Normal rate.  Pulmonary:     Effort: Pulmonary effort is normal. No respiratory distress.  Skin:    General: Skin is warm and dry.  Neurological:     Mental Status: Wendy Fleming is unresponsive.            Vital Signs: BP (!) 86/35 (BP Location: Left Arm)   Pulse 86   Temp 98.2 F (36.8 C) (Oral)   Resp 18   Ht 5\' 2"  (1.575 m)   Wt 84 kg   SpO2 (!) 81%   BMI 33.87 kg/m  SpO2: SpO2: (!) 81 % O2 Device: O2 Device: Tracheostomy Collar O2 Flow Rate: O2 Flow Rate (L/min): 6 L/min      Palliative Assessment/Data: 10%   Palliative Care Assessment & Plan   Patient Profile: 82 y.o. female admitted to [facility] 8/30  with ruptured Acomm who is s/p EVD by neurosurgery.  Hospital course has been complicated by respiratory failure with aspiration pneumonia.  Wendy Fleming has been unable to be weaned from mechanical ventilation and consideration for tracheostomy.   PMT initially consulted on 9/14 and family elected to proceed with tracheostomy. Wendy Fleming has declined further and now has transitioned to full comfort measures after discussion with neurosurgery and PCCM. PMT has been re-consulted on 9/18 for "comfort -  likely residential hospice."  Assessment: End of life care  Recommendations/Plan: Continued DNR/DNI Continue comfort-focused care, no adjustments required to opioid infusion today No oxygen, give morphine boluses PRN for air hunger PMT will continue to follow and support   Prognosis:  Hours - Days  Discharge Planning: Anticipated Hospital Death  Care plan was discussed with patient's family   MDM high         Johannah Rozas Jeni Salles, PA-C  Palliative Medicine Team Team phone # [phone]  Thank you for allowing the Palliative Medicine Team to assist in the care of this patient. Please utilize secure chat with additional questions, if there is no response within 30 minutes please call the above phone number.  Palliative Medicine Team providers are available by phone from 7am to 7pm daily and can be reached through the team cell phone.  Should this patient require assistance outside of these hours, please call the patient's attending physician.

## 2023-05-09 NOTE — Plan of Care (Signed)
  Problem: Health Behavior/Discharge Planning: Goal: Goals will be collaboratively established with patient/family Outcome: Progressing   Problem: Nutrition: Goal: Risk of aspiration will decrease Outcome: Progressing   Problem: Respiratory: Goal: Ability to maintain a clear airway and adequate ventilation will improve Outcome: Progressing   Problem: Respiratory: Goal: Patent airway maintenance will improve Outcome: Progressing   Problem: Respiratory: Goal: Patent airway maintenance will improve Outcome: Progressing

## 2023-05-09 NOTE — Progress Notes (Signed)
PROGRESS NOTE  Wendy Fleming KZS:[id number] DOB: 31-May-1941 DOA: 04-28-2023 PCP: Juluis Rainier, MD (Inactive)   LOS: 27 days   Brief Narrative / Interim history: 82yo with hx anemia, htn who presented from home after being found unresponsive and vomiting. Workup demonstrated aneurysmal subarachnoid hemorrhage s/p coil embolization. Neurosurgery had been following. Pt was intubated for airway protection. During hospital course, it was felt unlikely she will return home with family support and minimal chance of being functionally independent. Decision was ultimately made with family for transition to comfort.   Subjective / 24h Interval events: Remains unresponsive.  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 Per Neurosurgery, improvement to the point of interactivity is highly unlikely. Decision was made to transition to comfort measures.  Palliative care following, anticipate hospital death   Active problems Acute hypoxic respiratory failure -Initially intubated for airway protection. Later s/p trach on 9/15, now off O2 on room air with morphine gtt   Dysphagia - noted   End of Life -Goals of care were addressed with pt transitioning to comfort measures at this time, per above   DNR -Comfort measures  Scheduled Meds:  sodium chloride flush  10-40 mL Intracatheter Q12H   Continuous Infusions:  sodium chloride Stopped (04/18/23 1050)   morphine 4 mg/hr (05/09/23 0446)   PRN Meds:.acetaminophen **OR** acetaminophen, [DISCONTINUED] glycopyrrolate **OR** [DISCONTINUED] glycopyrrolate **OR**  glycopyrrolate, haloperidol lactate, LORazepam, morphine injection, morphine, [DISCONTINUED] ondansetron **OR** ondansetron (ZOFRAN) IV, mouth rinse, polyvinyl alcohol, sodium chloride flush  Current Outpatient Medications  Medication Instructions   acetaminophen (TYLENOL) 650 mg, Oral, Every 6 hours PRN   cholecalciferol (VITAMIN D) 1,000 Units, Oral, BH-each morning   Glucosamine HCl (GLUCOSAMINE PO) Oral   Multiple Vitamin (MULTIVITAMIN WITH MINERALS) TABS 1 tablet, Oral, BH-each morning   Naproxen Sodium (ALEVE) 220 MG CAPS 1 capsule, Oral, Once PRN   PRESCRIPTION MEDICATION 1 application , 2 times weekly   trandolapril (MAVIK) 4 mg, Oral, BH-each morning    Diet Orders (From admission, onward)     Start     Ordered   04/28/23 0001  Diet NPO time specified  (Pre Percutaneous Dilitation Tracheostomy)  Diet effective midnight       Comments: Clarify time of tube feed discontinuation with provider.   04/27/23 1736            DVT prophylaxis:    Lab Results  Component Value Date   PLT 344 04/29/2023      Code Status: Do not attempt resuscitation (DNR) - Comfort care  Family Communication: family at bedside   Status is: Inpatient Remains inpatient appropriate because: anticipate in hospital death   Level of care: Palliative Care  Consultants:  Neurosurgery  Palliative  Objective: Vitals:   05/08/23 2025 05/08/23 2346 05/09/23 0325 05/09/23 0841  BP: (!) 89/62   (!) 86/35  Pulse: 97 83 (!) 40 86  Resp: 18 16 16 18   Temp: 97.9 F (36.6 C)   98.2 F (36.8 C)  TempSrc: Oral   Oral  SpO2: (!) 79% (!) 81% (!) 88% (!) 81%  Weight:      Height:  Intake/Output Summary (Last 24 hours) at 05/09/2023 1000 Last data filed at 05/09/2023 5366 Gross per 24 hour  Intake 122.9 ml  Output --  Net 122.9 ml   Wt Readings from Last 3 Encounters:  04/30/23 84 kg  06/07/14 81.6 kg  06/07/12 83.9 kg    Examination:  Constitutional: NAD  Data Reviewed: I have  independently reviewed following labs and imaging studies   CBC No results for input(s): "WBC", "HGB", "HCT", "PLT", "MCV", "MCH", "MCHC", "RDW", "LYMPHSABS", "MONOABS", "EOSABS", "BASOSABS", "BANDABS" in the last 168 hours.  Invalid input(s): "NEUTRABS", "BANDSABD"  No results for input(s): "NA", "K", "CL", "CO2", "GLUCOSE", "BUN", "CREATININE", "CALCIUM", "AST", "ALT", "ALKPHOS", "BILITOT", "ALBUMIN", "MG", "CRP", "DDIMER", "PROCALCITON", "LATICACIDVEN", "INR", "TSH", "CORTISOL", "HGBA1C", "AMMONIA", "BNP" in the last 168 hours.  Invalid input(s): "GFRCGP", "PHOSPHOROUS"  ------------------------------------------------------------------------------------------------------------------ No results for input(s): "CHOL", "HDL", "LDLCALC", "TRIG", "CHOLHDL", "LDLDIRECT" in the last 72 hours.  No results found for: "HGBA1C" ------------------------------------------------------------------------------------------------------------------ No results for input(s): "TSH", "T4TOTAL", "T3FREE", "THYROIDAB" in the last 72 hours.  Invalid input(s): "FREET3"  Cardiac Enzymes No results for input(s): "CKMB", "TROPONINI", "MYOGLOBIN" in the last 168 hours.  Invalid input(s): "CK" ------------------------------------------------------------------------------------------------------------------    Component Value Date/Time   BNP 322.1 (H) 04/26/2023 2330    CBG: No results for input(s): "GLUCAP" in the last 168 hours.  No results found for this or any previous visit (from the past 240 hour(s)).   Radiology Studies: No results found.   Pamella Pert, MD, PhD [facility]  Between 7 am - 7 pm I am available, please contact me via Amion (for emergencies) or Securechat (non urgent messages)  Between 7 pm - 7 am I am not available, please contact night coverage MD/APP via Amion

## 2023-05-10 DIAGNOSIS — I619 Nontraumatic intracerebral hemorrhage, unspecified: Secondary | ICD-10-CM | POA: Diagnosis not present

## 2023-05-10 DIAGNOSIS — Z515 Encounter for palliative care: Secondary | ICD-10-CM | POA: Diagnosis not present

## 2023-05-10 DIAGNOSIS — R4182 Altered mental status, unspecified: Secondary | ICD-10-CM | POA: Diagnosis not present

## 2023-05-10 NOTE — Progress Notes (Signed)
Daily Progress Note   [name]       Date: 05/10/2023 DOB: 06-20-1941  Age: 82 y.o. MRN#: [id number] Attending Physician: Leatha Gilding, MD Primary Care Physician: Juluis Rainier, MD (Inactive) Admit Date: 04/02/2023  Reason for Consultation/Follow-up: Establishing goals of care and Terminal Care  Subjective: Patient's breathing is irregular with periods of apnea. She appears comfortable in NAD. Family at bedside.  Length of Stay: 28  Current Medications: Scheduled Meds:   sodium chloride flush  10-40 mL Intracatheter Q12H    Continuous Infusions:  sodium chloride Stopped (04/18/23 1050)   morphine 4 mg/hr (05/10/23 0257)    PRN Meds: acetaminophen **OR** acetaminophen, [DISCONTINUED] glycopyrrolate **OR** [DISCONTINUED] glycopyrrolate **OR** glycopyrrolate, haloperidol lactate, LORazepam, morphine injection, morphine, [DISCONTINUED] ondansetron **OR** ondansetron (ZOFRAN) IV, mouth rinse, polyvinyl alcohol, sodium chloride flush  Physical Exam Constitutional:      Appearance: She is ill-appearing.     Comments: unresponsive  Pulmonary:     Comments: Irregular with periods of apnea Skin:    General: Skin is warm and dry.             Vital Signs: BP 106/74 (BP Location: Left Arm)   Pulse 99   Temp 99 F (37.2 C) (Oral)   Resp 15   Ht 5\' 2"  (1.575 m)   Wt 84 kg   SpO2 (!) 84% Comment: PT comfort care  BMI 33.87 kg/m  SpO2: SpO2: (!) 84 % (PT comfort care) O2 Device: O2 Device: Room Air O2 Flow Rate: O2 Flow Rate (L/min): 6 L/min        Palliative Assessment/Data: 10%      Patient Active Problem List   Diagnosis Date Noted   Tracheostomy in place [facility]) 04/30/2023   Goals of care, counseling/discussion 04/30/2023   Acute  respiratory failure with hypoxia (HCC) 04/23/2023   Aspiration pneumonia (HCC) 04/23/2023   Altered mental status 04/16/2023   SAH (subarachnoid hemorrhage) (HCC) 04/16/2023   ICH (intracerebral hemorrhage) (HCC) 03/23/2023    Palliative Care Assessment & Plan   Patient Profile: 82 y.o. female admitted to [facility] 8/30 with ruptured Acomm who is s/p EVD by neurosurgery.  Hospital course has been complicated by respiratory failure with aspiration pneumonia.  She has been unable to be weaned from mechanical ventilation and consideration for tracheostomy.  PMT initially consulted on 9/14 and family elected to proceed with tracheostomy. She has declined further and now has transitioned to full comfort measures after discussion with neurosurgery and PCCM. PMT has been re-consulted on 9/18 for "comfort - likely residential hospice."  Assessment: Patient is full comfort care. On morphine continuous infusion. Two prn morphine doses given over last 24 hours.   Recommendations/Plan: Continue DNR/DNI Full comfort care Symptom management per [facility] PMT will continue to support   Prognosis:  Hours - Days  Discharge Planning: Anticipated Hospital Death   Time spent: 25 minutes  Thank you for allowing the Palliative Medicine Team to assist in the care of this patient.   Sherryll Burger, NP  Please contact Palliative Medicine Team phone at [phone] for questions and concerns.

## 2023-05-10 NOTE — Progress Notes (Signed)
PROGRESS NOTE  Wendy Fleming JYN:[id number] DOB: 11/15/1940 DOA: 03/18/2023 PCP: Juluis Rainier, MD (Inactive)   LOS: 28 days   Brief Narrative / Interim history: 82yo with hx anemia, htn who presented from home after being found unresponsive and vomiting. Workup demonstrated aneurysmal subarachnoid hemorrhage s/p coil embolization. Neurosurgery had been following. Pt was intubated for airway protection. During hospital course, it was felt unlikely she will return home with family support and minimal chance of being functionally independent. Decision was ultimately made with family for transition to comfort.   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 Per Neurosurgery, improvement to the point of interactivity is highly unlikely. Decision was made to transition to comfort measures.  Palliative care following, anticipate hospital death   Active problems Acute hypoxic respiratory failure -Initially intubated for airway protection. Later s/p trach on 9/15, now off O2 on room air with morphine gtt   Dysphagia - noted   End of Life -Goals of care were addressed with pt transitioning to comfort measures at this time, per above   DNR -Comfort measures  Scheduled Meds:  sodium chloride flush  10-40 mL Intracatheter Q12H   Continuous Infusions:  sodium chloride Stopped (04/18/23 1050)   morphine 4 mg/hr (05/10/23 0257)   PRN Meds:.acetaminophen **OR** acetaminophen, [DISCONTINUED] glycopyrrolate **OR** [DISCONTINUED] glycopyrrolate **OR** glycopyrrolate, haloperidol  lactate, LORazepam, morphine injection, morphine, [DISCONTINUED] ondansetron **OR** ondansetron (ZOFRAN) IV, mouth rinse, polyvinyl alcohol, sodium chloride flush  Current Outpatient Medications  Medication Instructions   acetaminophen (TYLENOL) 650 mg, Oral, Every 6 hours PRN   cholecalciferol (VITAMIN D) 1,000 Units, Oral, BH-each morning   Glucosamine HCl (GLUCOSAMINE PO) Oral   Multiple Vitamin (MULTIVITAMIN WITH MINERALS) TABS 1 tablet, Oral, BH-each morning   Naproxen Sodium (ALEVE) 220 MG CAPS 1 capsule, Oral, Once PRN   PRESCRIPTION MEDICATION 1 application , 2 times weekly   trandolapril (MAVIK) 4 mg, Oral, BH-each morning    Diet Orders (From admission, onward)     Start     Ordered   04/28/23 0001  Diet NPO time specified  (Pre Percutaneous Dilitation Tracheostomy)  Diet effective midnight       Comments: Clarify time of tube feed discontinuation with provider.   04/27/23 1736            DVT prophylaxis:    Lab Results  Component Value Date   PLT 344 04/29/2023      Code Status: Do not attempt resuscitation (DNR) - Comfort care  Family Communication: family at bedside   Status is: Inpatient Remains inpatient appropriate because: anticipate in hospital death   Level of care: Palliative Care  Consultants:  Neurosurgery  Palliative  Objective: Vitals:   05/10/23 0331 05/10/23 0400 05/10/23 0745 05/10/23 0809  BP:  103/74 106/74   Pulse: 85 90 (!) 44 99  Resp: 14 11  15   Temp:  99.5 F (37.5 C) 99 F (37.2 C)   TempSrc:  Axillary Oral   SpO2: (!) 81% (!) 88% (!) 89% (!) 84%  Weight:      Height:        Intake/Output Summary (Last 24 hours)  at 05/10/2023 2952 Last data filed at 05/10/2023 0257 Gross per 24 hour  Intake 90.66 ml  Output --  Net 90.66 ml   Wt Readings from Last 3 Encounters:  04/30/23 84 kg  06/07/14 81.6 kg  06/07/12 83.9 kg    Examination:  Constitutional: nad  Data Reviewed: I have independently reviewed following  labs and imaging studies   CBC No results for input(s): "WBC", "HGB", "HCT", "PLT", "MCV", "MCH", "MCHC", "RDW", "LYMPHSABS", "MONOABS", "EOSABS", "BASOSABS", "BANDABS" in the last 168 hours.  Invalid input(s): "NEUTRABS", "BANDSABD"  No results for input(s): "NA", "K", "CL", "CO2", "GLUCOSE", "BUN", "CREATININE", "CALCIUM", "AST", "ALT", "ALKPHOS", "BILITOT", "ALBUMIN", "MG", "CRP", "DDIMER", "PROCALCITON", "LATICACIDVEN", "INR", "TSH", "CORTISOL", "HGBA1C", "AMMONIA", "BNP" in the last 168 hours.  Invalid input(s): "GFRCGP", "PHOSPHOROUS"  ------------------------------------------------------------------------------------------------------------------ No results for input(s): "CHOL", "HDL", "LDLCALC", "TRIG", "CHOLHDL", "LDLDIRECT" in the last 72 hours.  No results found for: "HGBA1C" ------------------------------------------------------------------------------------------------------------------ No results for input(s): "TSH", "T4TOTAL", "T3FREE", "THYROIDAB" in the last 72 hours.  Invalid input(s): "FREET3"  Cardiac Enzymes No results for input(s): "CKMB", "TROPONINI", "MYOGLOBIN" in the last 168 hours.  Invalid input(s): "CK" ------------------------------------------------------------------------------------------------------------------    Component Value Date/Time   BNP 322.1 (H) 04/26/2023 2330    CBG: No results for input(s): "GLUCAP" in the last 168 hours.  No results found for this or any previous visit (from the past 240 hour(s)).   Radiology Studies: No results found.   Pamella Pert, MD, PhD [facility]  Between 7 am - 7 pm I am available, please contact me via Amion (for emergencies) or Securechat (non urgent messages)  Between 7 pm - 7 am I am not available, please contact night coverage MD/APP via Amion

## 2023-05-10 NOTE — Plan of Care (Signed)
  Problem: Education: Goal: Knowledge of patient specific risk factors will improve Wendy Fleming N/A or DELETE if not current risk factor) Outcome: Not Applicable   Problem: Health Behavior/Discharge Planning: Goal: Ability to manage health-related needs will improve Outcome: Not Applicable Goal: Goals will be collaboratively established with patient/family Outcome: Not Applicable   Problem: Self-Care: Goal: Ability to participate in self-care as condition permits will improve Outcome: Not Applicable Goal: Verbalization of feelings and concerns over difficulty with self-care will improve Outcome: Not Applicable Goal: Ability to communicate needs accurately will improve Outcome: Not Applicable   Problem: Nutrition: Goal: Risk of aspiration will decrease Outcome: Not Applicable Goal: Dietary intake will improve Outcome: Not Applicable

## 2023-05-10 NOTE — Plan of Care (Signed)
  Problem: Respiratory: Goal: Patent airway maintenance will improve Outcome: Progressing   Problem: Respiratory: Goal: Patent airway maintenance will improve Outcome: Progressing   Problem: Pain Managment: Goal: General experience of comfort will improve Outcome: Progressing   Problem: Safety: Goal: Ability to remain free from injury will improve Outcome: Progressing

## 2023-05-11 DIAGNOSIS — Z7189 Other specified counseling: Secondary | ICD-10-CM | POA: Diagnosis not present

## 2023-05-11 DIAGNOSIS — Z515 Encounter for palliative care: Secondary | ICD-10-CM | POA: Diagnosis not present

## 2023-05-11 DIAGNOSIS — I609 Nontraumatic subarachnoid hemorrhage, unspecified: Secondary | ICD-10-CM | POA: Diagnosis not present

## 2023-05-11 NOTE — Progress Notes (Signed)
PROGRESS NOTE  Wendy Fleming VHQ:[id number] DOB: 1940-10-06 DOA: 05-01-23 PCP: Juluis Rainier, MD (Inactive)   LOS: 29 days   Brief Narrative / Interim history: 82yo with hx anemia, htn who presented from home after being found unresponsive and vomiting. Workup demonstrated aneurysmal subarachnoid hemorrhage s/p coil embolization. Neurosurgery had been following. Pt was intubated for airway protection. During hospital course, it was felt unlikely she will return home with family support and minimal chance of being functionally independent. Decision was ultimately made with family for transition to comfort.   Subjective / 24h Interval events: Unresponsive.  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 Per Neurosurgery, improvement to the point of interactivity is highly unlikely. Decision was made to transition to comfort measures.  Palliative care following, anticipate hospital death   Active problems Acute hypoxic respiratory failure -Initially intubated for airway protection. Later s/p trach on 9/15, now off O2 on room air with morphine gtt   Dysphagia - noted   End of Life -Goals of care were addressed with pt transitioning to comfort measures at this time, per above   DNR -Comfort measures  Scheduled Meds:  sodium chloride flush  10-40 mL Intracatheter Q12H   Continuous Infusions:  sodium chloride 10 mL/hr at 05/11/23 0826   morphine 4 mg/hr (05/11/23 0950)   PRN Meds:.acetaminophen **OR** acetaminophen, [DISCONTINUED] glycopyrrolate **OR** [DISCONTINUED] glycopyrrolate **OR**  glycopyrrolate, haloperidol lactate, LORazepam, morphine injection, morphine, [DISCONTINUED] ondansetron **OR** ondansetron (ZOFRAN) IV, mouth rinse, polyvinyl alcohol, sodium chloride flush  Current Outpatient Medications  Medication Instructions   acetaminophen (TYLENOL) 650 mg, Oral, Every 6 hours PRN   cholecalciferol (VITAMIN D) 1,000 Units, Oral, BH-each morning   Glucosamine HCl (GLUCOSAMINE PO) Oral   Multiple Vitamin (MULTIVITAMIN WITH MINERALS) TABS 1 tablet, Oral, BH-each morning   Naproxen Sodium (ALEVE) 220 MG CAPS 1 capsule, Oral, Once PRN   PRESCRIPTION MEDICATION 1 application , 2 times weekly   trandolapril (MAVIK) 4 mg, Oral, BH-each morning    Diet Orders (From admission, onward)     Start     Ordered   04/28/23 0001  Diet NPO time specified  (Pre Percutaneous Dilitation Tracheostomy)  Diet effective midnight       Comments: Clarify time of tube feed discontinuation with provider.   04/27/23 1736            DVT prophylaxis:    Lab Results  Component Value Date   PLT 344 04/29/2023      Code Status: Do not attempt resuscitation (DNR) - Comfort care  Family Communication: family at bedside   Status is: Inpatient Remains inpatient appropriate because: anticipate in hospital death   Level of care: Palliative Care  Consultants:  Neurosurgery  Palliative  Objective: Vitals:   05/10/23 2327 05/11/23 0011 05/11/23 0329 05/11/23 0820  BP:  106/78  (!) 109/97  Pulse: 96 (!) 33 (!) 32 91  Resp: 16 13 13 19   Temp:  99.1 F (37.3 C)  98.6 F (37 C)  TempSrc:  Oral  Oral  SpO2: 94% (!) 88% (!) 87% 94%  Weight:      Height:  Intake/Output Summary (Last 24 hours) at 05/11/2023 1016 Last data filed at 05/11/2023 0858 Gross per 24 hour  Intake 283.19 ml  Output 1150 ml  Net -866.81 ml   Wt Readings from Last 3 Encounters:  04/30/23 84 kg  06/07/14 81.6 kg  06/07/12 83.9 kg    Examination:  Constitutional: nad  Data Reviewed: I have  independently reviewed following labs and imaging studies   CBC No results for input(s): "WBC", "HGB", "HCT", "PLT", "MCV", "MCH", "MCHC", "RDW", "LYMPHSABS", "MONOABS", "EOSABS", "BASOSABS", "BANDABS" in the last 168 hours.  Invalid input(s): "NEUTRABS", "BANDSABD"  No results for input(s): "NA", "K", "CL", "CO2", "GLUCOSE", "BUN", "CREATININE", "CALCIUM", "AST", "ALT", "ALKPHOS", "BILITOT", "ALBUMIN", "MG", "CRP", "DDIMER", "PROCALCITON", "LATICACIDVEN", "INR", "TSH", "CORTISOL", "HGBA1C", "AMMONIA", "BNP" in the last 168 hours.  Invalid input(s): "GFRCGP", "PHOSPHOROUS"  ------------------------------------------------------------------------------------------------------------------ No results for input(s): "CHOL", "HDL", "LDLCALC", "TRIG", "CHOLHDL", "LDLDIRECT" in the last 72 hours.  No results found for: "HGBA1C" ------------------------------------------------------------------------------------------------------------------ No results for input(s): "TSH", "T4TOTAL", "T3FREE", "THYROIDAB" in the last 72 hours.  Invalid input(s): "FREET3"  Cardiac Enzymes No results for input(s): "CKMB", "TROPONINI", "MYOGLOBIN" in the last 168 hours.  Invalid input(s): "CK" ------------------------------------------------------------------------------------------------------------------    Component Value Date/Time   BNP 322.1 (H) 04/26/2023 2330    CBG: No results for input(s): "GLUCAP" in the last 168 hours.  No results found for this or any previous visit (from the past 240 hour(s)).   Radiology Studies: No results found.   Pamella Pert, MD, PhD [facility]  Between 7 am - 7 pm I am available, please contact me via Amion (for emergencies) or Securechat (non urgent messages)  Between 7 pm - 7 am I am not available, please contact night coverage MD/APP via Amion

## 2023-05-11 NOTE — Progress Notes (Signed)
Patient is taking agonal breaths 12bpm. Notified charge nurse. Family in the room.

## 2023-05-11 NOTE — Progress Notes (Signed)
Daily Progress Note   [name]       Date: 05/11/2023 DOB: [birth year]  Age: 82 y.o. MRN#: [id number] Attending Physician: Leatha Gilding, MD Primary Care Physician: Juluis Rainier, MD (Inactive) Admit Date: May 02, 2023  Reason for Consultation/Follow-up: Establishing goals of care and Terminal Care  Subjective: Patient's breathing is irregular with periods of apnea. She appears comfortable in NAD. Family at bedside.  Length of Stay: 29  Current Medications: Scheduled Meds:   sodium chloride flush  10-40 mL Intracatheter Q12H    Continuous Infusions:  sodium chloride 10 mL/hr at 05/11/23 0826   morphine 4 mg/hr (05/11/23 0826)    PRN Meds: acetaminophen **OR** acetaminophen, [DISCONTINUED] glycopyrrolate **OR** [DISCONTINUED] glycopyrrolate **OR** glycopyrrolate, haloperidol lactate, LORazepam, morphine injection, morphine, [DISCONTINUED] ondansetron **OR** ondansetron (ZOFRAN) IV, mouth rinse, polyvinyl alcohol, sodium chloride flush  Physical Exam Constitutional:      Appearance: She is ill-appearing.     Comments: unresponsive  Pulmonary:     Comments: Irregular with periods of apnea Skin:    General: Skin is warm and dry.             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 female admitted to [facility] May 02, 2023 with ruptured Acomm who is s/p EVD by neurosurgery.  Hospital course has been complicated by respiratory failure with aspiration pneumonia.  She has been unable to be weaned from mechanical ventilation and consideration for tracheostomy.    PMT initially consulted  on 9/14 and family elected to proceed with tracheostomy. She has declined further and now has transitioned to full comfort measures after discussion with neurosurgery and PCCM. PMT has been re-consulted on 9/18 for "comfort - likely residential hospice."  Assessment: Patient is full comfort care. On morphine continuous infusion. No prn doses given over last 24 hours. They share their surprise that the patient has held on this long. We discuss that each patient's end of life is different. Questions answered. Family share life review of patient. They plan to have a celebration of life for the patient today while all the family is in town.   Recommendations/Plan: Continue DNR/DNI Full comfort care Symptom management per [facility] PMT will continue to support   Prognosis:  Hours - Days  Discharge Planning: Anticipated Hospital Death   Time spent: 25 minutes  Thank you for allowing the Palliative Medicine Team to assist in the care of this patient.   Sherryll Burger, NP  Please contact Palliative Medicine Team phone at [phone] for questions and concerns.

## 2023-05-12 DIAGNOSIS — Z515 Encounter for palliative care: Secondary | ICD-10-CM | POA: Diagnosis not present

## 2023-05-12 DIAGNOSIS — R4182 Altered mental status, unspecified: Secondary | ICD-10-CM | POA: Diagnosis not present

## 2023-05-12 NOTE — Progress Notes (Signed)
PROGRESS NOTE  VERGIE MAGUIRE QMV:[id number] DOB: September 10, 1940 DOA: 03/17/2023 PCP: Juluis Rainier, MD (Inactive)   LOS: 30 days   Brief Narrative / Interim history: 82yo with hx anemia, htn who presented from home after being found unresponsive and vomiting. Workup demonstrated aneurysmal subarachnoid hemorrhage s/p coil embolization. Neurosurgery had been following. Pt was intubated for airway protection. During hospital course, it was felt unlikely she will return home with family support and minimal chance of being functionally independent. Decision was ultimately made with family for transition to comfort.   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 Per Neurosurgery, improvement to the point of interactivity is highly unlikely. Decision was made to transition to comfort measures.  Palliative care following, anticipate hospital death   Active problems Acute hypoxic respiratory failure -Initially intubated for airway protection. Later s/p trach on 9/15, now off O2 on room air with morphine gtt   Dysphagia - noted   End of Life -Goals of care were addressed with pt transitioning to comfort measures at this time, per above   DNR -Comfort measures  Scheduled Meds:  sodium chloride flush  10-40 mL Intracatheter Q12H   Continuous Infusions:  sodium chloride 250 mL (05/11/23 1315)   morphine 4 mg/hr (05/11/23 1820)   PRN Meds:.acetaminophen **OR** acetaminophen, [DISCONTINUED] glycopyrrolate **OR** [DISCONTINUED] glycopyrrolate **OR** glycopyrrolate, haloperidol  lactate, LORazepam, morphine injection, morphine, [DISCONTINUED] ondansetron **OR** ondansetron (ZOFRAN) IV, mouth rinse, polyvinyl alcohol, sodium chloride flush  Current Outpatient Medications  Medication Instructions   acetaminophen (TYLENOL) 650 mg, Oral, Every 6 hours PRN   cholecalciferol (VITAMIN D) 1,000 Units, Oral, BH-each morning   Glucosamine HCl (GLUCOSAMINE PO) Oral   Multiple Vitamin (MULTIVITAMIN WITH MINERALS) TABS 1 tablet, Oral, BH-each morning   Naproxen Sodium (ALEVE) 220 MG CAPS 1 capsule, Oral, Once PRN   PRESCRIPTION MEDICATION 1 application , 2 times weekly   trandolapril (MAVIK) 4 mg, Oral, BH-each morning    Diet Orders (From admission, onward)     Start     Ordered   04/28/23 0001  Diet NPO time specified  (Pre Percutaneous Dilitation Tracheostomy)  Diet effective midnight       Comments: Clarify time of tube feed discontinuation with provider.   04/27/23 1736            DVT prophylaxis:    Lab Results  Component Value Date   PLT 344 04/29/2023      Code Status: Do not attempt resuscitation (DNR) - Comfort care  Family Communication: family at bedside   Status is: Inpatient Remains inpatient appropriate because: anticipate in hospital death   Level of care: Palliative Care  Consultants:  Neurosurgery  Palliative  Objective: Vitals:   05/11/23 0329 05/11/23 0820 05/11/23 1053 05/11/23 2055  BP:  (!) 109/97  (!) 92/47  Pulse: (!) 32 91 84 87  Resp: 13 19 12    Temp:  98.6 F (37 C)  98.7 F (37.1 C)  TempSrc:  Oral  Oral  SpO2: (!) 87% 94%  92%  Weight:      Height:        Intake/Output Summary (Last 24 hours)  at 05/12/2023 0848 Last data filed at 05/11/2023 1820 Gross per 24 hour  Intake 114.12 ml  Output 300 ml  Net -185.88 ml   Wt Readings from Last 3 Encounters:  04/30/23 84 kg  06/07/14 81.6 kg  06/07/12 83.9 kg    Examination:  Constitutional: nad  Data Reviewed: I have independently reviewed following labs  and imaging studies   CBC No results for input(s): "WBC", "HGB", "HCT", "PLT", "MCV", "MCH", "MCHC", "RDW", "LYMPHSABS", "MONOABS", "EOSABS", "BASOSABS", "BANDABS" in the last 168 hours.  Invalid input(s): "NEUTRABS", "BANDSABD"  No results for input(s): "NA", "K", "CL", "CO2", "GLUCOSE", "BUN", "CREATININE", "CALCIUM", "AST", "ALT", "ALKPHOS", "BILITOT", "ALBUMIN", "MG", "CRP", "DDIMER", "PROCALCITON", "LATICACIDVEN", "INR", "TSH", "CORTISOL", "HGBA1C", "AMMONIA", "BNP" in the last 168 hours.  Invalid input(s): "GFRCGP", "PHOSPHOROUS"  ------------------------------------------------------------------------------------------------------------------ No results for input(s): "CHOL", "HDL", "LDLCALC", "TRIG", "CHOLHDL", "LDLDIRECT" in the last 72 hours.  No results found for: "HGBA1C" ------------------------------------------------------------------------------------------------------------------ No results for input(s): "TSH", "T4TOTAL", "T3FREE", "THYROIDAB" in the last 72 hours.  Invalid input(s): "FREET3"  Cardiac Enzymes No results for input(s): "CKMB", "TROPONINI", "MYOGLOBIN" in the last 168 hours.  Invalid input(s): "CK" ------------------------------------------------------------------------------------------------------------------    Component Value Date/Time   BNP 322.1 (H) 04/26/2023 2330    CBG: No results for input(s): "GLUCAP" in the last 168 hours.  No results found for this or any previous visit (from the past 240 hour(s)).   Radiology Studies: No results found.   Pamella Pert, MD, PhD [facility]  Between 7 am - 7 pm I am available, please contact me via Amion (for emergencies) or Securechat (non urgent messages)  Between 7 pm - 7 am I am not available, please contact night coverage MD/APP via Amion

## 2023-05-12 NOTE — Progress Notes (Signed)
Daily Progress Note   [name]       Date: 05/12/2023 DOB: 15-Oct-1940  Age: 82 y.o. MRN#: [id number] Attending Physician: Leatha Gilding, MD Primary Care Physician: Juluis Rainier, MD (Inactive) Admit Date: 2023/04/27  Reason for Consultation/Follow-up: Establishing goals of care and Terminal Care  Subjective: Patient is in bed in NAD. No family at bedside.   Length of Stay: 30  Current Medications: Scheduled Meds:   sodium chloride flush  10-40 mL Intracatheter Q12H    Continuous Infusions:  sodium chloride 250 mL (05/11/23 1315)   morphine 4 mg/hr (05/11/23 1820)    PRN Meds: acetaminophen **OR** acetaminophen, [DISCONTINUED] glycopyrrolate **OR** [DISCONTINUED] glycopyrrolate **OR** glycopyrrolate, haloperidol lactate, LORazepam, morphine injection, morphine, [DISCONTINUED] ondansetron **OR** ondansetron (ZOFRAN) IV, mouth rinse, polyvinyl alcohol, sodium chloride flush  Physical Exam Constitutional:      Appearance: She is ill-appearing.     Comments: unresponsive  Pulmonary:     Comments: Irregular with periods of apnea Skin:    General: Skin is warm and dry.             Vital Signs: BP (!) 92/47   Pulse 87   Temp 98.7 F (37.1 C) (Oral)   Resp 12 Comment: agonal breaths.  Ht 5\' 2"  (1.575 m)   Wt 84 kg   SpO2 92%   BMI 33.87 kg/m  SpO2: SpO2: 92 % O2 Device: O2 Device: Room Air O2 Flow Rate: O2 Flow Rate (L/min): 6 L/min        Palliative Assessment/Data: 10%      Patient Active Problem List   Diagnosis Date Noted   Tracheostomy in place [facility]) 04/30/2023   Goals of care, counseling/discussion 04/30/2023   Acute respiratory failure with hypoxia (HCC) 04/23/2023   Aspiration pneumonia (HCC) 04/23/2023   Altered mental  status 04/16/2023   SAH (subarachnoid hemorrhage) (HCC) 04/16/2023   ICH (intracerebral hemorrhage) (HCC) 27-Apr-2023    Palliative Care Assessment & Plan   Patient Profile: 82 y.o. female admitted to [facility] 2023/04/27 with ruptured Acomm who is s/p EVD by neurosurgery.  Hospital course has been complicated by respiratory failure with aspiration pneumonia.  She has been unable to be weaned from mechanical ventilation and consideration for tracheostomy.    PMT initially consulted on 9/14 and family elected to proceed with  tracheostomy. She has declined further and now has transitioned to full comfort measures after discussion with neurosurgery and PCCM. PMT has been re-consulted on 9/18 for "comfort - likely residential hospice."  Assessment: Patient is full comfort care. On morphine continuous infusion. No prn doses given over last 24 hours.   Recommendations/Plan: Continue DNR/DNI Full comfort care Symptom management per [facility] PMT will continue to support   Prognosis:  Hours - Days  Discharge Planning: Anticipated Hospital Death   Time spent: 25 minutes  Thank you for allowing the Palliative Medicine Team to assist in the care of this patient.   Sherryll Burger, NP  Please contact Palliative Medicine Team phone at [phone] for questions and concerns.

## 2023-05-12 NOTE — Plan of Care (Signed)
  Problem: Education: Goal: Knowledge of disease or condition will improve Outcome: Not Progressing Goal: Knowledge of secondary prevention will improve (MUST DOCUMENT ALL) Outcome: Not Progressing   Problem: Coping: Goal: Will verbalize positive feelings about self Outcome: Not Met (add Reason) Goal: Will identify appropriate support needs Outcome: Completed/Met   Problem: Respiratory: Goal: Ability to maintain a clear airway and adequate ventilation will improve Outcome: Completed/Met   Problem: Role Relationship: Goal: Method of communication will improve Outcome: Not Progressing   Problem: Education: Goal: Knowledge about tracheostomy care/management will improve Outcome: Not Applicable   Problem: Activity: Goal: Ability to tolerate increased activity will improve Outcome: Not Progressing   Problem: Health Behavior/Discharge Planning: Goal: Ability to manage tracheostomy will improve Outcome: Not Applicable

## 2023-05-13 DIAGNOSIS — Z515 Encounter for palliative care: Secondary | ICD-10-CM | POA: Diagnosis not present

## 2023-05-13 DIAGNOSIS — R4182 Altered mental status, unspecified: Secondary | ICD-10-CM | POA: Diagnosis not present

## 2023-05-13 NOTE — Progress Notes (Signed)
PROGRESS NOTE  Wendy Fleming WJX:[id number] DOB: 10/11/1940 DOA: Apr 26, 2023 PCP: Juluis Rainier, MD (Inactive)   LOS: 31 days   Brief Narrative / Interim history: 82yo with hx anemia, htn who presented from home after being found unresponsive and vomiting. Workup demonstrated aneurysmal subarachnoid hemorrhage s/p coil embolization. Neurosurgery had been following. Pt was intubated for airway protection. During hospital course, it was felt unlikely she will return home with family support and minimal chance of being functionally independent. Decision was ultimately made with family for transition to comfort.   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 Per Neurosurgery, improvement to the point of interactivity is highly unlikely. Decision was made to transition to comfort measures.  Palliative care following, anticipate hospital death   Active problems Acute hypoxic respiratory failure -Initially intubated for airway protection. Later s/p trach on 9/15, now off O2 on room air with morphine gtt   Dysphagia - noted   End of Life -Goals of care were addressed with pt transitioning to comfort measures at this time, per above   DNR -Comfort measures  Scheduled Meds:  sodium chloride flush  10-40 mL Intracatheter Q12H   Continuous Infusions:  sodium chloride 250 mL (05/11/23 1315)   morphine 5 mg/hr (05/13/23 0658)   PRN Meds:.acetaminophen **OR** acetaminophen, [DISCONTINUED] glycopyrrolate **OR** [DISCONTINUED] glycopyrrolate **OR** glycopyrrolate, haloperidol  lactate, LORazepam, morphine injection, morphine, [DISCONTINUED] ondansetron **OR** ondansetron (ZOFRAN) IV, mouth rinse, polyvinyl alcohol, sodium chloride flush  Current Outpatient Medications  Medication Instructions   acetaminophen (TYLENOL) 650 mg, Oral, Every 6 hours PRN   cholecalciferol (VITAMIN D) 1,000 Units, Oral, BH-each morning   Glucosamine HCl (GLUCOSAMINE PO) Oral   Multiple Vitamin (MULTIVITAMIN WITH MINERALS) TABS 1 tablet, Oral, BH-each morning   Naproxen Sodium (ALEVE) 220 MG CAPS 1 capsule, Oral, Once PRN   PRESCRIPTION MEDICATION 1 application , 2 times weekly   trandolapril (MAVIK) 4 mg, Oral, BH-each morning    Diet Orders (From admission, onward)     Start     Ordered   04/28/23 0001  Diet NPO time specified  (Pre Percutaneous Dilitation Tracheostomy)  Diet effective midnight       Comments: Clarify time of tube feed discontinuation with provider.   04/27/23 1736            DVT prophylaxis:    Lab Results  Component Value Date   PLT 344 04/29/2023      Code Status: Do not attempt resuscitation (DNR) - Comfort care  Family Communication: family at bedside   Status is: Inpatient Remains inpatient appropriate because: anticipate in hospital death   Level of care: Palliative Care  Consultants:  Neurosurgery  Palliative  Objective: Vitals:   05/12/23 2313 05/13/23 0325 05/13/23 0844 05/13/23 0852  BP:   (!) 85/57   Pulse: (!) 33 88    Resp: 10 12 20 12   Temp:   99.2 F (37.3 C)   TempSrc:   Oral   SpO2:  96% (!) 87% 91%  Weight:      Height:        Intake/Output Summary (Last 24 hours) at 05/13/2023 1108 Last  data filed at 05/13/2023 0900 Gross per 24 hour  Intake 8 ml  Output --  Net 8 ml   Wt Readings from Last 3 Encounters:  04/30/23 84 kg  06/07/14 81.6 kg  06/07/12 83.9 kg    Examination:  Constitutional: nad  Data Reviewed: I have independently reviewed following labs and imaging studies   CBC No results for  input(s): "WBC", "HGB", "HCT", "PLT", "MCV", "MCH", "MCHC", "RDW", "LYMPHSABS", "MONOABS", "EOSABS", "BASOSABS", "BANDABS" in the last 168 hours.  Invalid input(s): "NEUTRABS", "BANDSABD"  No results for input(s): "NA", "K", "CL", "CO2", "GLUCOSE", "BUN", "CREATININE", "CALCIUM", "AST", "ALT", "ALKPHOS", "BILITOT", "ALBUMIN", "MG", "CRP", "DDIMER", "PROCALCITON", "LATICACIDVEN", "INR", "TSH", "CORTISOL", "HGBA1C", "AMMONIA", "BNP" in the last 168 hours.  Invalid input(s): "GFRCGP", "PHOSPHOROUS"  ------------------------------------------------------------------------------------------------------------------ No results for input(s): "CHOL", "HDL", "LDLCALC", "TRIG", "CHOLHDL", "LDLDIRECT" in the last 72 hours.  No results found for: "HGBA1C" ------------------------------------------------------------------------------------------------------------------ No results for input(s): "TSH", "T4TOTAL", "T3FREE", "THYROIDAB" in the last 72 hours.  Invalid input(s): "FREET3"  Cardiac Enzymes No results for input(s): "CKMB", "TROPONINI", "MYOGLOBIN" in the last 168 hours.  Invalid input(s): "CK" ------------------------------------------------------------------------------------------------------------------    Component Value Date/Time   BNP 322.1 (H) 04/26/2023 2330    CBG: No results for input(s): "GLUCAP" in the last 168 hours.  No results found for this or any previous visit (from the past 240 hour(s)).   Radiology Studies: No results found.   Pamella Pert, MD, PhD [facility]  Between 7 am - 7 pm I am available, please contact me via Amion (for emergencies) or Securechat (non urgent messages)  Between 7 pm - 7 am I am not available, please contact night coverage MD/APP via Amion

## 2023-05-13 NOTE — Progress Notes (Signed)
Daily Progress Note   [name]       Date: 05/13/2023 DOB: 07-30-1941  Age: 82 y.o. MRN#: [id number] Attending Physician: Leatha Gilding, MD Primary Care Physician: Juluis Rainier, MD (Inactive) Admit Date: 03/29/2023  Reason for Consultation/Follow-up: Establishing goals of care and Terminal Care  Subjective: Patient is in bed in NAD. No family at bedside.   Length of Stay: 31  Current Medications: Scheduled Meds:   sodium chloride flush  10-40 mL Intracatheter Q12H    Continuous Infusions:  sodium chloride 250 mL (05/11/23 1315)   morphine 5 mg/hr (05/13/23 0658)    PRN Meds: acetaminophen **OR** acetaminophen, [DISCONTINUED] glycopyrrolate **OR** [DISCONTINUED] glycopyrrolate **OR** glycopyrrolate, haloperidol lactate, LORazepam, morphine injection, morphine, [DISCONTINUED] ondansetron **OR** ondansetron (ZOFRAN) IV, mouth rinse, polyvinyl alcohol, sodium chloride flush  Physical Exam Constitutional:      Appearance: She is ill-appearing.     Comments: unresponsive  Pulmonary:     Comments: Irregular with periods of apnea Skin:    General: Skin is warm and dry.             Vital Signs: BP (!) 85/57 (BP Location: Left Arm)   Pulse 88   Temp 99.2 F (37.3 C) (Oral)   Resp 12   Ht 5\' 2"  (1.575 m)   Wt 84 kg   SpO2 91%   BMI 33.87 kg/m  SpO2: SpO2: 91 % O2 Device: O2 Device: Room Air O2 Flow Rate: O2 Flow Rate (L/min): 6 L/min        Palliative Assessment/Data: 10%      Patient Active Problem List   Diagnosis Date Noted   Tracheostomy in place [facility]) 04/30/2023   Goals of care, counseling/discussion 04/30/2023   Acute respiratory failure with hypoxia (HCC) 04/23/2023   Aspiration pneumonia (HCC) 04/23/2023   Altered mental  status 04/16/2023   SAH (subarachnoid hemorrhage) (HCC) 04/16/2023   ICH (intracerebral hemorrhage) (HCC) 04/08/2023    Palliative Care Assessment & Plan   Patient Profile: 82 y.o. female admitted to [facility] 8/30 with ruptured Acomm who is s/p EVD by neurosurgery.  Hospital course has been complicated by respiratory failure with aspiration pneumonia.  She has been unable to be weaned from mechanical ventilation and consideration for tracheostomy.    PMT initially consulted on 9/14 and family elected to  proceed with tracheostomy. She has declined further and now has transitioned to full comfort measures after discussion with neurosurgery and PCCM. PMT has been re-consulted on 9/18 for "comfort - likely residential hospice."  Assessment: Patient is full comfort care. On morphine continuous infusion. One prn doses given over last 24 hours. Answered questions for patient's family. We discussed the length of time the patient has been on comfort measures. Reassured family that although her transition is taking longer than originally expected, the patient is being kept very comfortable. They agree.   Recommendations/Plan: Continue DNR/DNI Full comfort care Symptom management per [facility] PMT will continue to support   Prognosis:  Hours - Days  Discharge Planning: Anticipated Hospital Death   Time spent: 25 minutes  Thank you for allowing the Palliative Medicine Team to assist in the care of this patient.   Sherryll Burger, NP  Please contact Palliative Medicine Team phone at [phone] for questions and concerns.

## 2023-05-13 NOTE — Plan of Care (Signed)
  Problem: Education: Goal: Knowledge of disease or condition will improve Outcome: Not Met (add Reason)   Problem: Spontaneous Subarachnoid Hemorrhage Tissue Perfusion: Goal: Complications of Spontaneous Subarachnoid Hemorrhage will be minimized Outcome: Not Progressing   Problem: Coping: Goal: Will verbalize positive feelings about self Outcome: Not Applicable   Problem: Activity: Goal: Ability to tolerate increased activity will improve Outcome: Not Progressing   Problem: Role Relationship: Goal: Method of communication will improve Outcome: Not Progressing

## 2023-05-14 DIAGNOSIS — Z515 Encounter for palliative care: Secondary | ICD-10-CM | POA: Diagnosis not present

## 2023-05-14 DIAGNOSIS — J9601 Acute respiratory failure with hypoxia: Secondary | ICD-10-CM | POA: Diagnosis not present

## 2023-05-14 DIAGNOSIS — I609 Nontraumatic subarachnoid hemorrhage, unspecified: Secondary | ICD-10-CM | POA: Diagnosis not present

## 2023-05-14 NOTE — Progress Notes (Signed)
   05/14/23 1532  TOC Brief Assessment  Insurance and Status Reviewed  Patient has primary care physician Yes  Home environment has been reviewed home  Prior level of function: independent  Prior/Current Home Services No current home services  Social Determinants of Health Reivew SDOH reviewed no interventions necessary  Readmission risk has been reviewed Yes  Transition of care needs no transition of care needs at this time     TOC continuing to follow. Per palliative team anticipate in hospital death

## 2023-05-14 NOTE — Plan of Care (Signed)
  Problem: Education: Goal: Knowledge of disease or condition will improve Outcome: Progressing Goal: Knowledge of secondary prevention will improve (MUST DOCUMENT ALL) Outcome: Progressing   Problem: Spontaneous Subarachnoid Hemorrhage Tissue Perfusion: Goal: Complications of Spontaneous Subarachnoid Hemorrhage will be minimized Outcome: Progressing   Problem: Activity: Goal: Ability to tolerate increased activity will improve Outcome: Progressing   Problem: Role Relationship: Goal: Method of communication will improve Outcome: Progressing   Problem: Activity: Goal: Ability to tolerate increased activity will improve Outcome: Progressing   Problem: Respiratory: Goal: Patent airway maintenance will improve Outcome: Progressing   Problem: Health Behavior/Discharge Planning: Goal: Ability to manage tracheostomy will improve Outcome: Progressing   Problem: Respiratory: Goal: Patent airway maintenance will improve Outcome: Progressing   Problem: Role Relationship: Goal: Ability to communicate will improve Outcome: Progressing   Problem: Education: Goal: Knowledge of General Education information will improve Description: Including pain rating scale, medication(s)/side effects and non-pharmacologic comfort measures Outcome: Progressing   Problem: Health Behavior/Discharge Planning: Goal: Ability to manage health-related needs will improve Outcome: Progressing   Problem: Clinical Measurements: Goal: Ability to maintain clinical measurements within normal limits will improve Outcome: Progressing Goal: Will remain free from infection Outcome: Progressing Goal: Diagnostic test results will improve Outcome: Progressing Goal: Respiratory complications will improve Outcome: Progressing Goal: Cardiovascular complication will be avoided Outcome: Progressing   Problem: Activity: Goal: Risk for activity intolerance will decrease Outcome: Progressing   Problem:  Nutrition: Goal: Adequate nutrition will be maintained Outcome: Progressing   Problem: Coping: Goal: Level of anxiety will decrease Outcome: Progressing   Problem: Elimination: Goal: Will not experience complications related to bowel motility Outcome: Progressing Goal: Will not experience complications related to urinary retention Outcome: Progressing   Problem: Pain Managment: Goal: General experience of comfort will improve Outcome: Progressing   Problem: Safety: Goal: Ability to remain free from injury will improve Outcome: Progressing   Problem: Skin Integrity: Goal: Risk for impaired skin integrity will decrease Outcome: Progressing

## 2023-05-14 NOTE — Progress Notes (Signed)
PROGRESS NOTE  CAMBRIA UMEDA OZH:[id number] DOB: [birth year] DOA: 03/29/2023 PCP: Juluis Rainier, MD (Inactive)   LOS: 32 days   Brief Narrative / Interim history: 82yo with hx anemia, htn who presented from home after being found unresponsive and vomiting. Workup demonstrated aneurysmal subarachnoid hemorrhage s/p coil embolization. Neurosurgery had been following. Pt was intubated for airway protection. During hospital course, it was felt unlikely she will return home with family support and minimal chance of being functionally independent. Decision was ultimately made with family for transition to comfort.   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 Per Neurosurgery, improvement to the point of interactivity is highly unlikely. Decision was made to transition to comfort measures.  Palliative care following, anticipate hospital death   Active problems Acute hypoxic respiratory failure -Initially intubated for airway protection. Later s/p trach on 9/15, now off O2 on room air with morphine gtt   Dysphagia - noted   End of Life -Goals of care were addressed with pt transitioning to comfort measures at this time, per above   DNR -Comfort measures  Scheduled Meds:  sodium chloride flush  10-40 mL Intracatheter Q12H   Continuous Infusions:  sodium chloride 10 mL/hr at 05/14/23 0209   morphine 5 mg/hr (05/14/23 0304)   PRN Meds:.acetaminophen **OR** acetaminophen, [DISCONTINUED] glycopyrrolate **OR** [DISCONTINUED] glycopyrrolate **OR** glycopyrrolate, haloperidol  lactate, LORazepam, morphine injection, morphine, [DISCONTINUED] ondansetron **OR** ondansetron (ZOFRAN) IV, mouth rinse, polyvinyl alcohol, sodium chloride flush  Current Outpatient Medications  Medication Instructions   acetaminophen (TYLENOL) 650 mg, Oral, Every 6 hours PRN   cholecalciferol (VITAMIN D) 1,000 Units, Oral, BH-each morning   Glucosamine HCl (GLUCOSAMINE PO) Oral   Multiple Vitamin (MULTIVITAMIN WITH MINERALS) TABS 1 tablet, Oral, BH-each morning   Naproxen Sodium (ALEVE) 220 MG CAPS 1 capsule, Oral, Once PRN   PRESCRIPTION MEDICATION 1 application , 2 times weekly   trandolapril (MAVIK) 4 mg, Oral, BH-each morning    Diet Orders (From admission, onward)     Start     Ordered   04/28/23 0001  Diet NPO time specified  (Pre Percutaneous Dilitation Tracheostomy)  Diet effective midnight       Comments: Clarify time of tube feed discontinuation with provider.   04/27/23 1736            DVT prophylaxis:    Lab Results  Component Value Date   PLT 344 04/29/2023      Code Status: Do not attempt resuscitation (DNR) - Comfort care  Family Communication: family at bedside   Status is: Inpatient Remains inpatient appropriate because: anticipate in hospital death   Level of care: Palliative Care  Consultants:  Neurosurgery  Palliative  Objective: Vitals:   05/13/23 1218 05/13/23 1622 05/13/23 2112 05/14/23 1010  BP:      Pulse:   (!) 106 (!) 105  Resp: 18  15 15   Temp:      TempSrc:      SpO2: 94% 91% 91%   Weight:      Height:        Intake/Output Summary (Last 24 hours) at 05/14/2023 1041 Last data filed at  05/14/2023 0800 Gross per 24 hour  Intake 845.24 ml  Output --  Net 845.24 ml   Wt Readings from Last 3 Encounters:  04/30/23 84 kg  06/07/14 81.6 kg  06/07/12 83.9 kg    Examination:  Constitutional: nad  Data Reviewed: I have independently reviewed following labs and imaging studies   CBC No results for input(s): "WBC", "HGB",  "HCT", "PLT", "MCV", "MCH", "MCHC", "RDW", "LYMPHSABS", "MONOABS", "EOSABS", "BASOSABS", "BANDABS" in the last 168 hours.  Invalid input(s): "NEUTRABS", "BANDSABD"  No results for input(s): "NA", "K", "CL", "CO2", "GLUCOSE", "BUN", "CREATININE", "CALCIUM", "AST", "ALT", "ALKPHOS", "BILITOT", "ALBUMIN", "MG", "CRP", "DDIMER", "PROCALCITON", "LATICACIDVEN", "INR", "TSH", "CORTISOL", "HGBA1C", "AMMONIA", "BNP" in the last 168 hours.  Invalid input(s): "GFRCGP", "PHOSPHOROUS"  ------------------------------------------------------------------------------------------------------------------ No results for input(s): "CHOL", "HDL", "LDLCALC", "TRIG", "CHOLHDL", "LDLDIRECT" in the last 72 hours.  No results found for: "HGBA1C" ------------------------------------------------------------------------------------------------------------------ No results for input(s): "TSH", "T4TOTAL", "T3FREE", "THYROIDAB" in the last 72 hours.  Invalid input(s): "FREET3"  Cardiac Enzymes No results for input(s): "CKMB", "TROPONINI", "MYOGLOBIN" in the last 168 hours.  Invalid input(s): "CK" ------------------------------------------------------------------------------------------------------------------    Component Value Date/Time   BNP 322.1 (H) 04/26/2023 2330    CBG: No results for input(s): "GLUCAP" in the last 168 hours.  No results found for this or any previous visit (from the past 240 hour(s)).   Radiology Studies: No results found.   Pamella Pert, MD, PhD [facility]  Between 7 am - 7 pm I am available, please contact me via Amion (for emergencies) or Securechat (non urgent messages)  Between 7 pm - 7 am I am not available, please contact night coverage MD/APP via Amion

## 2023-05-14 NOTE — Progress Notes (Signed)
Patient ID: [name], female   DOB: July 19, 1941, 82 y.o.   MRN: [id number]    Progress Note from the Palliative Medicine Team at [facility]   [name]        Date: 05/14/2023 DOB: [birth year]  Age: 82 y.o. MRN#: [id number] Attending Physician: Leatha Gilding, MD Primary Care Physician: Juluis Rainier, MD (Inactive) Admit Date: 04/03/2023   Reason for Consultation/Follow-up    Establish goals of care.  T  HPI/ Brief Hospital Review  82 year old female h/o anemia, presented from home after being found unresponsive and vomiting.  Workup demonstrated aneurysmal subarachnoid hemorrhage status post coiling embolization.  Patient was intubated for airway protection.  During hospital course was felt unlikely she will return home with family support and minimal chance of being functionally independent.  Decision was ultimately made by family for transition to comfort   Subjective  Extensive chart review has been completed prior to meeting with patient/family  including labs, vital signs, imaging, progress/consult notes, orders, medications and available advance directive documents.    This NP assessed patient at the bedside as a follow up for palliative medicine needs and emotional support. Patient's husband at bedside.  Patient appears comfortable on her continuous morphine drip.   Ongoing conversation regarding current medical situation.  Created space and opportunity for him to share thoughts and feelings regarding the anticipated death of his wife.  He shared how they met originally at [employer] why they were both in school there, but did not get married and reconnected for another 40 years.   He shared her love of gardening.  She was a fourth grade teacher for many years.  They loved to walk together and stay active.  He is processing his great anticipated loss.  We talked about concepts specific to patients at end-of-life,  being aware of family  presents even if they cannot respond back.  Education offered today regarding the natural trajectory and expectations at end-of-life.  Questions and concerns addressed   Emotional support offered  Time:  35  minutes  Detailed review of medical records ( labs, imaging, vital signs), medically appropriate exam ( MS, skin, cardia,  resp)   discussed with treatment team, counseling and education to patient, family, staff, documenting clinical information, medication management, coordination of care    Lorinda Creed NP  Palliative Medicine Team Team Phone # [phone] Pager [phone]

## 2023-05-15 DIAGNOSIS — I619 Nontraumatic intracerebral hemorrhage, unspecified: Secondary | ICD-10-CM | POA: Diagnosis not present

## 2023-05-15 LAB — GLUCOSE, CAPILLARY: Glucose-Capillary: 107 mg/dL — ABNORMAL HIGH (ref 70–99)

## 2023-05-15 NOTE — Plan of Care (Signed)
  Problem: Role Relationship: Goal: Ability to communicate will improve Outcome: Not Progressing   Problem: Education: Goal: Knowledge of General Education information will improve Description: Including pain rating scale, medication(s)/side effects and non-pharmacologic comfort measures Outcome: Not Progressing   Problem: Clinical Measurements: Goal: Diagnostic test results will improve Outcome: Not Progressing

## 2023-05-15 NOTE — Plan of Care (Signed)
  Problem: Safety: Goal: Ability to remain free from injury will improve Outcome: Progressing   Problem: Nutrition: Goal: Adequate nutrition will be maintained Outcome: Not Progressing

## 2023-05-15 NOTE — Progress Notes (Signed)
PROGRESS NOTE  Wendy Fleming  DOB: 27-May-1941  PCP: Juluis Rainier, MD (Inactive) NWG:[id number]  DOA: 04/09/2023  LOS: 33 days  Hospital Day: 34  Brief narrative: Wendy Fleming is a 82 y.o. female with PMH significant for hypertension, arthritis. 8/30, patient was brought to the ED after she was found unresponsive. Workup showed aneurysmal subarachnoid hemorrhage for which she underwent coil embolization by neurosurgery. Pt was intubated for airway protection.  Unable to extubate and required tracheostomy creation on 9/15. During hospital course, it was felt unlikely she will return home with family support and minimal chance of being functionally independent.  Palliative care was consulted. Decision was ultimately made with family for transition to comfort.   Subjective: Patient was seen and examined this morning. Propped up in bed.  Tracheostomy in.  Most Family members including daughter at bedside.  Assessment and plan: Comfort care status Initially admitted for subarachnoid hemorrhage and underwent coil immobilization.  With lack of medical progress in her recovery, decision was made to transition to comfort care. Comfort care order set in place.  Palliative care following. Anticipated in-hospital death.  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  Per Neurosurgery, improvement to the point of interactivity is highly unlikely.    Acute hypoxic respiratory failure  Initially intubated for airway protection.  Later s/p trach on 9/15, now off O2 on room air with morphine gtt   Dysphagia - noted   Goals of care   Code Status: Do not attempt resuscitation (DNR) - Comfort care   Consultants: Palliative care Family Communication: Multiple family members at bedside  Status: Inpatient Level of care:  Palliative Care   Patient is from: Home Needs to continue in-hospital care: In-hospital death most likely   Diet:  Diet  Order             Diet NPO time specified  Diet effective midnight                   Scheduled Meds:  sodium chloride flush  10-40 mL Intracatheter Q12H    PRN meds: acetaminophen **OR** acetaminophen, [DISCONTINUED] glycopyrrolate **OR** [DISCONTINUED] glycopyrrolate **OR** glycopyrrolate, haloperidol lactate, LORazepam, morphine injection, morphine, [DISCONTINUED] ondansetron **OR** ondansetron (ZOFRAN) IV, mouth rinse, polyvinyl alcohol, sodium chloride flush   Infusions:   sodium chloride 10 mL/hr at 05/14/23 2005   morphine 5 mg/hr (05/15/23 0034)    Antimicrobials: Anti-infectives (From admission, onward)    Start     Dose/Rate Route Frequency Ordered Stop   04/28/23 1400  piperacillin-tazobactam (ZOSYN) IVPB 3.375 g  Status:  Discontinued        3.375 g 12.5 mL/hr over 240 Minutes Intravenous Every 8 hours 04/28/23 0956 04/30/23 1426   04/23/23 1400  piperacillin-tazobactam (ZOSYN) IVPB 3.375 g  Status:  Discontinued        3.375 g 12.5 mL/hr over 240 Minutes Intravenous Every 8 hours 04/23/23 0934 04/24/23 1527   04/22/23 1445  meropenem (MERREM) 1 g in sodium chloride 0.9 % 100 mL IVPB  Status:  Discontinued        1 g 200 mL/hr over 30 Minutes Intravenous Every 8 hours 04/22/23 1355 04/23/23 0934   04/22/23 1400  cefTAZidime (FORTAZ) 2 g in sodium chloride 0.9 % 100 mL IVPB  Status:  Discontinued        2 g 200 mL/hr over 30 Minutes Intravenous Every 8 hours 04/22/23 1259 04/22/23 1355   04/22/23 1345  vancomycin (VANCOREADY) IVPB 1500 mg/300 mL  1,500 mg 150 mL/hr over 120 Minutes Intravenous  Once 04/22/23 1259 04/22/23 2341   04/13/23 1530  cefTRIAXone (ROCEPHIN) 2 g in sodium chloride 0.9 % 100 mL IVPB        2 g 200 mL/hr over 30 Minutes Intravenous Every 24 hours 04/13/23 1157 04/16/23 1610   05-09-23 1530  cefTRIAXone (ROCEPHIN) 1 g in sodium chloride 0.9 % 100 mL IVPB  Status:  Discontinued        1 g 200 mL/hr over 30 Minutes Intravenous Every  24 hours 05-09-2023 1440 04/13/23 1157       Objective: Vitals:   05/15/23 0700 05/15/23 0848  BP:  (!) 81/59  Pulse:  (!) 54  Resp: 12 16  Temp:  98.7 F (37.1 C)  SpO2: 90% (!) 78%    Intake/Output Summary (Last 24 hours) at 05/15/2023 1700 Last data filed at 05/15/2023 0900 Gross per 24 hour  Intake 290.14 ml  Output --  Net 290.14 ml   Filed Weights   04/28/23 0500 04/29/23 0500 04/30/23 0500  Weight: 86.1 kg 85 kg 84 kg   Weight change:  Body mass index is 33.87 kg/m.   Physical Exam: General exam: Remains comfortable.  Tracheostomy status.  On room air.  I did not do a detailed examination because of comfort care status  Data Review: I have personally reviewed the laboratory data and studies available.  F/u labs  Unresulted Labs (From admission, onward)    None       Total time spent in review of labs and imaging, patient evaluation, formulation of plan, documentation and communication with family: 25 minutes  Signed, Lorin Glass, MD [facility] 05/15/2023

## 2023-05-16 DIAGNOSIS — Z515 Encounter for palliative care: Secondary | ICD-10-CM | POA: Diagnosis not present

## 2023-05-16 DIAGNOSIS — R4182 Altered mental status, unspecified: Secondary | ICD-10-CM | POA: Diagnosis not present

## 2023-05-16 DIAGNOSIS — Z7189 Other specified counseling: Secondary | ICD-10-CM | POA: Diagnosis not present

## 2023-05-16 NOTE — Progress Notes (Signed)
Came to room for trach assessment.  Noted RR 8, sat 68-72%, slight dusky appearance.  No distress noted, pt appears to be resting comfortably.  RN was notified.  Currently family is not at bedside.

## 2023-05-16 NOTE — Progress Notes (Signed)
Daily Progress Note   [name]       Date: 05/16/2023 DOB: 08-30-1940  Age: 82 y.o. MRN#: [id number] Attending Physician: Lorin Glass, MD Primary Care Physician: Juluis Rainier, MD (Inactive) Admit Date: 03/20/2023  Reason for Consultation/Follow-up: Establishing goals of care and Terminal Care  Subjective: Patient is in bed in NAD. No family at bedside.   Length of Stay: 34  Current Medications: Scheduled Meds:   sodium chloride flush  10-40 mL Intracatheter Q12H    Continuous Infusions:  sodium chloride 10 mL/hr at 05/14/23 2005   morphine 5 mg/hr (05/15/23 2249)    PRN Meds: acetaminophen **OR** acetaminophen, [DISCONTINUED] glycopyrrolate **OR** [DISCONTINUED] glycopyrrolate **OR** glycopyrrolate, haloperidol lactate, LORazepam, morphine injection, morphine, [DISCONTINUED] ondansetron **OR** ondansetron (ZOFRAN) IV, mouth rinse, polyvinyl alcohol, sodium chloride flush  Physical Exam Constitutional:      Appearance: She is ill-appearing.     Comments: unresponsive  HENT:     Mouth/Throat:     Comments: tracheostomy Pulmonary:     Comments: Irregular with periods of apnea Skin:    General: Skin is warm and dry.             Vital Signs: BP (!) 87/74 (BP Location: Left Arm)   Pulse 100   Temp 98.4 F (36.9 C) (Oral)   Resp 16   Ht 5\' 2"  (1.575 m)   Wt 84 kg   SpO2 94%   BMI 33.87 kg/m  SpO2: SpO2: 94 % O2 Device: O2 Device: Room Air O2 Flow Rate: O2 Flow Rate (L/min): 6 L/min        Palliative Assessment/Data: 10%      Patient Active Problem List   Diagnosis Date Noted   Tracheostomy in place [facility]) 04/30/2023   Goals of care, counseling/discussion 04/30/2023   Acute respiratory failure with hypoxia (HCC) 04/23/2023    Aspiration pneumonia (HCC) 04/23/2023   Altered mental status 04/16/2023   SAH (subarachnoid hemorrhage) (HCC) 04/16/2023   ICH (intracerebral hemorrhage) (HCC) 04/04/2023    Palliative Care Assessment & Plan   Patient Profile: 82 y.o. female admitted to [facility] 8/30 with ruptured [employer] who is s/p EVD by neurosurgery.  Hospital course has been complicated by respiratory failure with aspiration pneumonia.  She has been unable to be weaned from mechanical ventilation and consideration for tracheostomy.  PMT initially consulted on 9/14 and family elected to proceed with tracheostomy. She has declined further and now has transitioned to full comfort measures after discussion with neurosurgery and PCCM. PMT has been re-consulted on 9/18 for "comfort - likely residential hospice."  Assessment: Patient is full comfort care. Family at bedside. Patient remains on morphine continuous infusion with no prn doses given over last 24 hours. Robinul given twice. Patient has some twitching-- notified RN for prn ativan. Answered questions for patient's family about care at residential hospice vs care at hospital. We discussed the length of time the patient has been on comfort measures.    Recommendations/Plan: Continue DNR/DNI Full comfort care Symptom management per [facility] PMT will continue to support   Prognosis:  Days  Discharge Planning: Anticipated Hospital Death   Time spent: 35 minutes  Thank you for allowing the Palliative Medicine Team to assist in the care of this patient.   Sherryll Burger, NP  Please contact Palliative Medicine Team phone at [phone] for questions and concerns.

## 2023-05-17 DIAGNOSIS — R4182 Altered mental status, unspecified: Secondary | ICD-10-CM | POA: Diagnosis not present

## 2023-05-17 DIAGNOSIS — Z7189 Other specified counseling: Secondary | ICD-10-CM | POA: Diagnosis not present

## 2023-05-17 DIAGNOSIS — Z515 Encounter for palliative care: Secondary | ICD-10-CM | POA: Diagnosis not present

## 2023-05-17 MED ORDER — LORAZEPAM 2 MG/ML IJ SOLN
2.0000 mg | INTRAMUSCULAR | Status: DC | PRN
  Administered 2023-05-17 – 2023-05-18 (×2): 2 mg via INTRAVENOUS
  Filled 2023-05-17 (×2): qty 1

## 2023-05-17 NOTE — Plan of Care (Signed)
  Problem: Education: Goal: Knowledge of disease or condition will improve Outcome: Not Progressing Goal: Knowledge of secondary prevention will improve (MUST DOCUMENT ALL) Outcome: Not Progressing   Problem: Spontaneous Subarachnoid Hemorrhage Tissue Perfusion: Goal: Complications of Spontaneous Subarachnoid Hemorrhage will be minimized Outcome: Not Progressing   Problem: Activity: Goal: Ability to tolerate increased activity will improve Outcome: Not Progressing

## 2023-05-17 NOTE — Progress Notes (Signed)
PROGRESS NOTE  Wendy Fleming  DOB: 1941/01/17  PCP: Juluis Rainier, MD (Inactive) ZOX:[id number]  DOA: May 09, 2023  LOS: 35 days  Hospital Day: 36  Brief narrative: Wendy Fleming is a 82 y.o. female with PMH significant for hypertension, arthritis. 2023/05/09, patient was brought to the ED after she was found unresponsive. Workup showed aneurysmal subarachnoid hemorrhage for which she underwent coil embolization by neurosurgery. Pt was intubated for airway protection.  Unable to extubate and required tracheostomy creation on 9/15. During hospital course, it was felt unlikely she will return home with family support and minimal chance of being functionally independent.  Palliative care was consulted. Decision was ultimately made with family for transition to comfort.   Subjective: Patient was seen and examined this morning. Not in distress.  Looks comfortable.  Family members at bedside.  Assessment and plan: Comfort care status Initially admitted for subarachnoid hemorrhage and underwent coil immobilization.  With lack of medical progress in her recovery, decision was made to transition to comfort care. Comfort care order set in place.  Palliative care following. Anticipated in-hospital death.  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  Per Neurosurgery, improvement to the point of interactivity is highly unlikely.    Acute hypoxic respiratory failure  Initially intubated for airway protection.  Later s/p trach on 9/15, now off O2 on room air with morphine gtt   Dysphagia - noted   Goals of care   Code Status: Do not attempt resuscitation (DNR) - Comfort care   Consultants: Palliative care Family Communication: Multiple family members at bedside  Status: Inpatient Level of care:  Palliative Care   Patient is from: Home Needs to continue in-hospital care: In-hospital death most likely   Diet:  Diet Order             Diet  NPO time specified  Diet effective midnight                   Scheduled Meds:  sodium chloride flush  10-40 mL Intracatheter Q12H    PRN meds: acetaminophen **OR** acetaminophen, [DISCONTINUED] glycopyrrolate **OR** [DISCONTINUED] glycopyrrolate **OR** glycopyrrolate, haloperidol lactate, LORazepam, morphine injection, morphine, [DISCONTINUED] ondansetron **OR** ondansetron (ZOFRAN) IV, mouth rinse, polyvinyl alcohol, sodium chloride flush   Infusions:   sodium chloride 10 mL/hr at 05/14/23 2005   morphine 5 mg/hr (05/17/23 1237)    Antimicrobials: Anti-infectives (From admission, onward)    Start     Dose/Rate Route Frequency Ordered Stop   04/28/23 1400  piperacillin-tazobactam (ZOSYN) IVPB 3.375 g  Status:  Discontinued        3.375 g 12.5 mL/hr over 240 Minutes Intravenous Every 8 hours 04/28/23 0956 04/30/23 1426   04/23/23 1400  piperacillin-tazobactam (ZOSYN) IVPB 3.375 g  Status:  Discontinued        3.375 g 12.5 mL/hr over 240 Minutes Intravenous Every 8 hours 04/23/23 0934 04/24/23 1527   04/22/23 1445  meropenem (MERREM) 1 g in sodium chloride 0.9 % 100 mL IVPB  Status:  Discontinued        1 g 200 mL/hr over 30 Minutes Intravenous Every 8 hours 04/22/23 1355 04/23/23 0934   04/22/23 1400  cefTAZidime (FORTAZ) 2 g in sodium chloride 0.9 % 100 mL IVPB  Status:  Discontinued        2 g 200 mL/hr over 30 Minutes Intravenous Every 8 hours 04/22/23 1259 04/22/23 1355   04/22/23 1345  vancomycin (VANCOREADY) IVPB 1500 mg/300 mL  1,500 mg 150 mL/hr over 120 Minutes Intravenous  Once 04/22/23 1259 04/22/23 2341   04/13/23 1530  cefTRIAXone (ROCEPHIN) 2 g in sodium chloride 0.9 % 100 mL IVPB        2 g 200 mL/hr over 30 Minutes Intravenous Every 24 hours 04/13/23 1157 04/16/23 1610   03/25/2023 1530  cefTRIAXone (ROCEPHIN) 1 g in sodium chloride 0.9 % 100 mL IVPB  Status:  Discontinued        1 g 200 mL/hr over 30 Minutes Intravenous Every 24 hours 03/18/2023 1440  04/13/23 1157       Objective: Vitals:   05/17/23 1101 05/17/23 1418  BP:    Pulse:  68  Resp: 18 15  Temp:    SpO2: (!) 63%     Intake/Output Summary (Last 24 hours) at 05/17/2023 1600 Last data filed at 05/17/2023 1202 Gross per 24 hour  Intake 157.53 ml  Output 300 ml  Net -142.47 ml   Filed Weights   04/28/23 0500 04/29/23 0500 04/30/23 0500  Weight: 86.1 kg 85 kg 84 kg   Weight change:  Body mass index is 33.87 kg/m.   Physical Exam: General exam: Remains comfortable.  Tracheostomy status.  On room air.  I did not do a detailed examination because of comfort care status  Data Review: I have personally reviewed the laboratory data and studies available.  F/u labs  Unresulted Labs (From admission, onward)    None       Total time spent in review of labs and imaging, patient evaluation, formulation of plan, documentation and communication with family: 25 minutes  Signed, Lorin Glass, MD [facility] 05/17/2023

## 2023-05-17 NOTE — Plan of Care (Signed)
  Problem: Education: Goal: Knowledge of disease or condition will improve Outcome: Progressing Goal: Knowledge of secondary prevention will improve (MUST DOCUMENT ALL) Outcome: Progressing   Problem: Spontaneous Subarachnoid Hemorrhage Tissue Perfusion: Goal: Complications of Spontaneous Subarachnoid Hemorrhage will be minimized Outcome: Progressing   Problem: Activity: Goal: Ability to tolerate increased activity will improve Outcome: Progressing   Problem: Role Relationship: Goal: Method of communication will improve Outcome: Progressing   Problem: Activity: Goal: Ability to tolerate increased activity will improve Outcome: Progressing   Problem: Respiratory: Goal: Patent airway maintenance will improve Outcome: Progressing   Problem: Health Behavior/Discharge Planning: Goal: Ability to manage tracheostomy will improve Outcome: Progressing   Problem: Respiratory: Goal: Patent airway maintenance will improve Outcome: Progressing   Problem: Role Relationship: Goal: Ability to communicate will improve Outcome: Progressing   Problem: Education: Goal: Knowledge of General Education information will improve Description: Including pain rating scale, medication(s)/side effects and non-pharmacologic comfort measures Outcome: Progressing   Problem: Health Behavior/Discharge Planning: Goal: Ability to manage health-related needs will improve Outcome: Progressing   Problem: Clinical Measurements: Goal: Ability to maintain clinical measurements within normal limits will improve Outcome: Progressing Goal: Will remain free from infection Outcome: Progressing Goal: Diagnostic test results will improve Outcome: Progressing Goal: Respiratory complications will improve Outcome: Progressing Goal: Cardiovascular complication will be avoided Outcome: Progressing   Problem: Activity: Goal: Risk for activity intolerance will decrease Outcome: Progressing   Problem:  Nutrition: Goal: Adequate nutrition will be maintained Outcome: Progressing   Problem: Coping: Goal: Level of anxiety will decrease Outcome: Progressing   Problem: Elimination: Goal: Will not experience complications related to bowel motility Outcome: Progressing Goal: Will not experience complications related to urinary retention Outcome: Progressing   Problem: Pain Managment: Goal: General experience of comfort will improve Outcome: Progressing   Problem: Safety: Goal: Ability to remain free from injury will improve Outcome: Progressing   Problem: Skin Integrity: Goal: Risk for impaired skin integrity will decrease Outcome: Progressing

## 2023-05-17 NOTE — Progress Notes (Signed)
PROGRESS NOTE  Wendy Fleming  DOB: July 16, 1941  PCP: Juluis Rainier, MD (Inactive) AOZ:[id number]  DOA: 04/07/2023  LOS: 35 days  Hospital Day: 36  Brief narrative: Wendy Fleming is a 82 y.o. female with PMH significant for hypertension, arthritis. 8/30, patient was brought to the ED after she was found unresponsive. Workup showed aneurysmal subarachnoid hemorrhage for which she underwent coil embolization by neurosurgery. Pt was intubated for airway protection.  Unable to extubate and required tracheostomy creation on 9/15. During hospital course, it was felt unlikely she will return home with family support and minimal chance of being functionally independent.  Palliative care was consulted. Decision was ultimately made with family for transition to comfort.   Subjective: Patient was seen and examined this morning. Not in distress.  Comfortable.  Family at bedside  Assessment and plan: Comfort care status Initially admitted for subarachnoid hemorrhage and underwent coil immobilization.  With lack of medical progress in her recovery, decision was made to transition to comfort care. Comfort care order set in place.  Palliative care following. Anticipated in-hospital death.  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  Per Neurosurgery, improvement to the point of interactivity is highly unlikely.    Acute hypoxic respiratory failure  Initially intubated for airway protection.  Later s/p trach on 9/15, now off O2 on room air with morphine gtt   Dysphagia - noted   Goals of care   Code Status: Do not attempt resuscitation (DNR) - Comfort care   Consultants: Palliative care Family Communication: Multiple family members at bedside  Status: Inpatient Level of care:  Palliative Care   Patient is from: Home Needs to continue in-hospital care: In-hospital death most likely   Diet:  Diet Order             Diet NPO time  specified  Diet effective midnight                   Scheduled Meds:  sodium chloride flush  10-40 mL Intracatheter Q12H    PRN meds: acetaminophen **OR** acetaminophen, [DISCONTINUED] glycopyrrolate **OR** [DISCONTINUED] glycopyrrolate **OR** glycopyrrolate, haloperidol lactate, LORazepam, morphine injection, morphine, [DISCONTINUED] ondansetron **OR** ondansetron (ZOFRAN) IV, mouth rinse, polyvinyl alcohol, sodium chloride flush   Infusions:   sodium chloride 10 mL/hr at 05/14/23 2005   morphine 5 mg/hr (05/17/23 1237)    Antimicrobials: Anti-infectives (From admission, onward)    Start     Dose/Rate Route Frequency Ordered Stop   04/28/23 1400  piperacillin-tazobactam (ZOSYN) IVPB 3.375 g  Status:  Discontinued        3.375 g 12.5 mL/hr over 240 Minutes Intravenous Every 8 hours 04/28/23 0956 04/30/23 1426   04/23/23 1400  piperacillin-tazobactam (ZOSYN) IVPB 3.375 g  Status:  Discontinued        3.375 g 12.5 mL/hr over 240 Minutes Intravenous Every 8 hours 04/23/23 0934 04/24/23 1527   04/22/23 1445  meropenem (MERREM) 1 g in sodium chloride 0.9 % 100 mL IVPB  Status:  Discontinued        1 g 200 mL/hr over 30 Minutes Intravenous Every 8 hours 04/22/23 1355 04/23/23 0934   04/22/23 1400  cefTAZidime (FORTAZ) 2 g in sodium chloride 0.9 % 100 mL IVPB  Status:  Discontinued        2 g 200 mL/hr over 30 Minutes Intravenous Every 8 hours 04/22/23 1259 04/22/23 1355   04/22/23 1345  vancomycin (VANCOREADY) IVPB 1500 mg/300 mL  1,500 mg 150 mL/hr over 120 Minutes Intravenous  Once 04/22/23 1259 04/22/23 2341   04/13/23 1530  cefTRIAXone (ROCEPHIN) 2 g in sodium chloride 0.9 % 100 mL IVPB        2 g 200 mL/hr over 30 Minutes Intravenous Every 24 hours 04/13/23 1157 04/16/23 1610   04/06/2023 1530  cefTRIAXone (ROCEPHIN) 1 g in sodium chloride 0.9 % 100 mL IVPB  Status:  Discontinued        1 g 200 mL/hr over 30 Minutes Intravenous Every 24 hours 04/01/2023 1440 04/13/23  1157       Objective: Vitals:   05/17/23 1101 05/17/23 1418  BP:    Pulse:  68  Resp: 18 15  Temp:    SpO2: (!) 63%     Intake/Output Summary (Last 24 hours) at 05/17/2023 1601 Last data filed at 05/17/2023 1202 Gross per 24 hour  Intake 157.53 ml  Output 300 ml  Net -142.47 ml   Filed Weights   04/28/23 0500 04/29/23 0500 04/30/23 0500  Weight: 86.1 kg 85 kg 84 kg   Weight change:  Body mass index is 33.87 kg/m.   Physical Exam: General exam: Remains comfortable.  Tracheostomy status.  On room air.  I did not do a detailed examination because of comfort care status  Data Review: I have personally reviewed the laboratory data and studies available.  F/u labs  Unresulted Labs (From admission, onward)    None       Total time spent in review of labs and imaging, patient evaluation, formulation of plan, documentation and communication with family: 25 minutes  Signed, Lorin Glass, MD [facility] 05/17/2023

## 2023-05-17 NOTE — Progress Notes (Signed)
Daily Progress Note   [name]       Date: 05/17/2023 DOB: Jan 08, 1941  Age: 82 y.o. MRN#: [id number] Attending Physician: Lorin Glass, MD Primary Care Physician: Juluis Rainier, MD (Inactive) Admit Date: 2023-04-29  Reason for Consultation/Follow-up: Establishing goals of care and Terminal Care  Subjective: Patient is in bed in NAD. Several family members at bedside.   Length of Stay: 35  Current Medications: Scheduled Meds:   sodium chloride flush  10-40 mL Intracatheter Q12H    Continuous Infusions:  sodium chloride 10 mL/hr at 05/14/23 2005   morphine 5 mg/hr (05/17/23 0226)    PRN Meds: acetaminophen **OR** acetaminophen, [DISCONTINUED] glycopyrrolate **OR** [DISCONTINUED] glycopyrrolate **OR** glycopyrrolate, haloperidol lactate, LORazepam, morphine injection, morphine, [DISCONTINUED] ondansetron **OR** ondansetron (ZOFRAN) IV, mouth rinse, polyvinyl alcohol, sodium chloride flush  Physical Exam Constitutional:      Appearance: She is ill-appearing.     Comments: unresponsive  HENT:     Mouth/Throat:     Comments: tracheostomy Pulmonary:     Comments: Irregular with periods of apnea Skin:    General: Skin is dry.     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 female admitted to [facility] 29-Apr-2023 with ruptured [employer]comm who is s/p EVD by neurosurgery.  Hospital course has been complicated by respiratory failure with aspiration pneumonia.  She  has been unable to be weaned from mechanical ventilation and consideration for tracheostomy.    PMT initially consulted on 9/14 and family elected to proceed with tracheostomy. She has declined further and now has transitioned to full comfort measures after discussion with neurosurgery and PCCM. PMT has been re-consulted on 9/18 for "comfort - likely residential hospice."  Assessment: Patient is full comfort care. Family at bedside. Patient remains on morphine continuous infusion with no prn doses given over last 24 hours. PRN ativan given three times over last 24 hours for twitching. No Robinul given. Patient has some cyanosis on face and nailbeds. Answered questions for patient's family about end of life medications and expectations. We discussed the length of time the patient has been on comfort measures.    Recommendations/Plan: Continue DNR/DNI Full comfort care Symptom management per MAR: Increased availability of prn ativan to every two hours PMT will continue to support   Prognosis:  Hours - Days  Discharge Planning: Anticipated Hospital Death   Time spent: 35 minutes  Thank you for allowing the Palliative Medicine Team to assist in the care of this patient.   Sherryll Burger, NP  Please contact Palliative Medicine Team phone at [phone] for questions and concerns.

## 2023-05-18 DIAGNOSIS — I619 Nontraumatic intracerebral hemorrhage, unspecified: Secondary | ICD-10-CM | POA: Diagnosis not present

## 2023-05-18 DIAGNOSIS — Z515 Encounter for palliative care: Secondary | ICD-10-CM | POA: Diagnosis not present

## 2023-05-18 DIAGNOSIS — I609 Nontraumatic subarachnoid hemorrhage, unspecified: Secondary | ICD-10-CM | POA: Diagnosis not present

## 2023-05-18 NOTE — Progress Notes (Signed)
26-May-2023 2129/07/10  Attending Physican Contact  Attending Physician Notified Y  Attending Physician (First and Last Name) Lorin Glass, MD  Post Mortem Checklist  Date of Death 05/26/23  Time of Death 2129-07-10  Pronounced By Malva Cogan RN, Josephine RN  Next of kin notified Yes  Name of next of kin notified of death [name]  Contact Person's Relationship to Patient Daughter  Contact Person's Phone Number [phone]  Contact Person's address [address] [place] [address]  Was the patient a No Code Blue or a Limited Code Blue? No  Did the patient die unattended? No  Patient restrained (physical/manual hold/chemical)? Not applicable  Height 5\' 2"  (1.575 m)  Weight 84 kg  Body preparation complete Y  [employer] (previously known as Administrator, sports)  Notification Date 05/26/23  Notification Time 2213-07-10  [employer] Number [id number]  Is patient a potential donor? 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 Not applicable  Dermatherapy linen/gowns NOT sent with patient or transporter Not applicable  Dead on Arrival (Emergency Department)  Patient dead on arrival? No  Notifications  Patient Placement notified that Post Mortem checklist is complete Yes  Patient Placement notified body transferred Transported to morgue  Medical Examiner  Is this a medical examiner's case? [facility] home name/address/phone # [employer] [facility] - [address] [phone]  Planned location of pickup [place]

## 2023-05-18 NOTE — Plan of Care (Signed)
Problem: Education: Goal: Knowledge of disease or condition will improve 06/02/2023 1909 by Johnney Killian, RN Outcome: Progressing 06/05/2023 1908 by Johnney Killian, RN Outcome: Not Progressing Goal: Knowledge of secondary prevention will improve (MUST DOCUMENT ALL) 05/17/2023 1909 by Johnney Killian, RN Outcome: Progressing 06/11/2023 1908 by Johnney Killian, RN Outcome: Not Progressing   Problem: Spontaneous Subarachnoid Hemorrhage Tissue Perfusion: Goal: Complications of Spontaneous Subarachnoid Hemorrhage will be minimized 05/21/2023 1909 by Johnney Killian, RN Outcome: Progressing 06/01/2023 1908 by Johnney Killian, RN Outcome: Not Progressing   Problem: Activity: Goal: Ability to tolerate increased activity will improve 05/14/2023 1909 by Johnney Killian, RN Outcome: Progressing 06/11/2023 1908 by Johnney Killian, RN Outcome: Not Progressing   Problem: Role Relationship: Goal: Method of communication will improve 05/14/2023 1909 by Johnney Killian, RN Outcome: Progressing 05/21/2023 1908 by Johnney Killian, RN Outcome: Not Progressing   Problem: Activity: Goal: Ability to tolerate increased activity will improve 05/14/2023 1909 by Johnney Killian, RN Outcome: Progressing 05/28/2023 1908 by Johnney Killian, RN Outcome: Not Progressing   Problem: Respiratory: Goal: Patent airway maintenance will improve 05/31/2023 1909 by Johnney Killian, RN Outcome: Progressing 05/20/2023 1908 by Johnney Killian, RN Outcome: Not Progressing   Problem: Health Behavior/Discharge Planning: Goal: Ability to manage tracheostomy will improve 05/14/2023 1909 by Johnney Killian, RN Outcome: Progressing 06/08/2023 1908 by Johnney Killian, RN Outcome: Not Progressing   Problem: Respiratory: Goal: Patent airway maintenance will improve 06/11/2023 1909 by Johnney Killian, RN Outcome: Progressing 05/31/2023 1908 by Johnney Killian, RN Outcome: Not Progressing   Problem: Role  Relationship: Goal: Ability to communicate will improve 06/02/2023 1909 by Johnney Killian, RN Outcome: Progressing 06/06/2023 1908 by Johnney Killian, RN Outcome: Not Progressing   Problem: Education: Goal: Knowledge of General Education information will improve Description: Including pain rating scale, medication(s)/side effects and non-pharmacologic comfort measures 06/06/2023 1909 by Johnney Killian, RN Outcome: Progressing 05/30/2023 1908 by Johnney Killian, RN Outcome: Not Progressing   Problem: Health Behavior/Discharge Planning: Goal: Ability to manage health-related needs will improve 06/07/2023 1909 by Johnney Killian, RN Outcome: Progressing 05/30/2023 1908 by Johnney Killian, RN Outcome: Not Progressing   Problem: Clinical Measurements: Goal: Ability to maintain clinical measurements within normal limits will improve 05/20/2023 1909 by Johnney Killian, RN Outcome: Progressing 05/24/2023 1908 by Johnney Killian, RN Outcome: Not Progressing Goal: Will remain free from infection 05/17/2023 1909 by Johnney Killian, RN Outcome: Progressing 05/31/2023 1908 by Johnney Killian, RN Outcome: Not Progressing Goal: Diagnostic test results will improve 05/21/2023 1909 by Johnney Killian, RN Outcome: Progressing 06/11/2023 1908 by Johnney Killian, RN Outcome: Not Progressing Goal: Respiratory complications will improve 05/31/2023 1909 by Johnney Killian, RN Outcome: Progressing 06/07/2023 1908 by Johnney Killian, RN Outcome: Not Progressing Goal: Cardiovascular complication will be avoided 05/15/2023 1909 by Johnney Killian, RN Outcome: Progressing 05/28/2023 1908 by Johnney Killian, RN Outcome: Not Progressing   Problem: Activity: Goal: Risk for activity intolerance will decrease 05/14/2023 1909 by Johnney Killian, RN Outcome: Progressing 06/01/2023 1908 by Johnney Killian, RN Outcome: Not Progressing   Problem: Nutrition: Goal: Adequate nutrition will be maintained 05/17/2023  1909 by Johnney Killian, RN Outcome: Progressing 05/20/2023 1908 by Johnney Killian, RN Outcome: Not Progressing   Problem: Coping: Goal: Level of anxiety will decrease 05/28/2023 1909 by Johnney Killian, RN Outcome: Progressing 06/12/2023 1908 by Terressa Koyanagi  E, RN Outcome: Not Progressing   Problem: Elimination: Goal: Will not experience complications related to bowel motility 06/05/2023 1909 by Johnney Killian, RN Outcome: Progressing 06/10/2023 1908 by Johnney Killian, RN Outcome: Not Progressing Goal: Will not experience complications related to urinary retention 06/01/2023 1909 by Johnney Killian, RN Outcome: Progressing 05/15/2023 1908 by Johnney Killian, RN Outcome: Not Progressing   Problem: Pain Managment: Goal: General experience of comfort will improve 05/15/2023 1909 by Johnney Killian, RN Outcome: Progressing 06/04/2023 1908 by Johnney Killian, RN Outcome: Not Progressing   Problem: Safety: Goal: Ability to remain free from injury will improve 05/20/2023 1909 by Johnney Killian, RN Outcome: Progressing 06/01/2023 1908 by Johnney Killian, RN Outcome: Not Progressing   Problem: Skin Integrity: Goal: Risk for impaired skin integrity will decrease 05/25/2023 1909 by Johnney Killian, RN Outcome: Progressing 05/25/2023 1908 by Johnney Killian, RN Outcome: Not Progressing

## 2023-05-18 NOTE — Plan of Care (Deleted)
  Problem: Education: Goal: Knowledge of disease or condition will improve Outcome: Not Progressing Goal: Knowledge of secondary prevention will improve (MUST DOCUMENT ALL) Outcome: Not Progressing   Problem: Spontaneous Subarachnoid Hemorrhage Tissue Perfusion: Goal: Complications of Spontaneous Subarachnoid Hemorrhage will be minimized Outcome: Not Progressing   Problem: Activity: Goal: Ability to tolerate increased activity will improve Outcome: Not Progressing   Problem: Role Relationship: Goal: Method of communication will improve Outcome: Not Progressing   Problem: Activity: Goal: Ability to tolerate increased activity will improve Outcome: Not Progressing   Problem: Respiratory: Goal: Patent airway maintenance will improve Outcome: Not Progressing   Problem: Health Behavior/Discharge Planning: Goal: Ability to manage tracheostomy will improve Outcome: Not Progressing   Problem: Respiratory: Goal: Patent airway maintenance will improve Outcome: Not Progressing   Problem: Role Relationship: Goal: Ability to communicate will improve Outcome: Not Progressing   Problem: Education: Goal: Knowledge of General Education information will improve Description: Including pain rating scale, medication(s)/side effects and non-pharmacologic comfort measures Outcome: Not Progressing   Problem: Health Behavior/Discharge Planning: Goal: Ability to manage health-related needs will improve Outcome: Not Progressing   Problem: Clinical Measurements: Goal: Ability to maintain clinical measurements within normal limits will improve Outcome: Not Progressing Goal: Will remain free from infection Outcome: Not Progressing Goal: Diagnostic test results will improve Outcome: Not Progressing Goal: Respiratory complications will improve Outcome: Not Progressing Goal: Cardiovascular complication will be avoided Outcome: Not Progressing   Problem: Activity: Goal: Risk for activity  intolerance will decrease Outcome: Not Progressing   Problem: Nutrition: Goal: Adequate nutrition will be maintained Outcome: Not Progressing   Problem: Coping: Goal: Level of anxiety will decrease Outcome: Not Progressing   Problem: Elimination: Goal: Will not experience complications related to bowel motility Outcome: Not Progressing Goal: Will not experience complications related to urinary retention Outcome: Not Progressing   Problem: Pain Managment: Goal: General experience of comfort will improve Outcome: Not Progressing   Problem: Safety: Goal: Ability to remain free from injury will improve Outcome: Not Progressing   Problem: Skin Integrity: Goal: Risk for impaired skin integrity will decrease Outcome: Not Progressing

## 2023-05-18 NOTE — Progress Notes (Signed)
PROGRESS NOTE  Wendy Fleming  DOB: 1941-02-19  PCP: Juluis Rainier, MD (Inactive) ION:[id number]  DOA: 03/27/2023  LOS: 36 days  Hospital Day: 37  Brief narrative: Wendy Fleming is a 82 y.o. female with PMH significant for hypertension, arthritis. 8/30, patient was brought to the ED after she was found unresponsive. Workup showed aneurysmal subarachnoid hemorrhage for which she underwent coil embolization by neurosurgery. Pt was intubated for airway protection.  Unable to extubate and required tracheostomy creation on 9/15. During hospital course, it was felt unlikely she will return home with family support and minimal chance of being functionally independent.  Palliative care was consulted. Decision was ultimately made with family for transition to comfort.   Subjective: Patient was seen and examined this morning. Not in distress.  Looks comfortable.   Vital signs trend noted.  Has intermittent fever spikes, blood pressure running low in 60s for last 24 hours.  O2 sat documented low for the last 24 hours. Daughter and son-in-law at bedside.  Assessment and plan: Comfort care status Initially admitted for subarachnoid hemorrhage and underwent coil immobilization.  With lack of medical progress in her recovery, decision was made to transition to comfort care. Comfort care order set in place.  Palliative care following. Anticipated in-hospital death. Emotional support provided.  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  Per Neurosurgery, improvement to the point of interactivity is highly unlikely.    Acute hypoxic respiratory failure  Initially intubated for airway protection.  Later s/p trach on 9/15, now off O2 on room air with morphine gtt   Dysphagia - noted   Goals of care   Code Status: Do not attempt resuscitation (DNR) - Comfort care   Consultants: Palliative care Family Communication: Multiple family members at  bedside  Status: Inpatient Level of care:  Palliative Care   Patient is from: Home Needs to continue in-hospital care: In-hospital death most likely   Diet:  Diet Order             Diet NPO time specified  Diet effective midnight                   Scheduled Meds:  sodium chloride flush  10-40 mL Intracatheter Q12H    PRN meds: acetaminophen **OR** acetaminophen, [DISCONTINUED] glycopyrrolate **OR** [DISCONTINUED] glycopyrrolate **OR** glycopyrrolate, haloperidol lactate, LORazepam, morphine injection, morphine, [DISCONTINUED] ondansetron **OR** ondansetron (ZOFRAN) IV, mouth rinse, polyvinyl alcohol, sodium chloride flush   Infusions:   sodium chloride 10 mL/hr at 05/14/23 2005   morphine 6 mg/hr (06/12/2023 0846)    Antimicrobials: Anti-infectives (From admission, onward)    Start     Dose/Rate Route Frequency Ordered Stop   04/28/23 1400  piperacillin-tazobactam (ZOSYN) IVPB 3.375 g  Status:  Discontinued        3.375 g 12.5 mL/hr over 240 Minutes Intravenous Every 8 hours 04/28/23 0956 04/30/23 1426   04/23/23 1400  piperacillin-tazobactam (ZOSYN) IVPB 3.375 g  Status:  Discontinued        3.375 g 12.5 mL/hr over 240 Minutes Intravenous Every 8 hours 04/23/23 0934 04/24/23 1527   04/22/23 1445  meropenem (MERREM) 1 g in sodium chloride 0.9 % 100 mL IVPB  Status:  Discontinued        1 g 200 mL/hr over 30 Minutes Intravenous Every 8 hours 04/22/23 1355 04/23/23 0934   04/22/23 1400  cefTAZidime (FORTAZ) 2 g in sodium chloride 0.9 % 100 mL IVPB  Status:  Discontinued  2 g 200 mL/hr over 30 Minutes Intravenous Every 8 hours 04/22/23 1259 04/22/23 1355   04/22/23 1345  vancomycin (VANCOREADY) IVPB 1500 mg/300 mL        1,500 mg 150 mL/hr over 120 Minutes Intravenous  Once 04/22/23 1259 04/22/23 2341   04/13/23 1530  cefTRIAXone (ROCEPHIN) 2 g in sodium chloride 0.9 % 100 mL IVPB        2 g 200 mL/hr over 30 Minutes Intravenous Every 24 hours 04/13/23 1157  04/16/23 1610   03/22/2023 1530  cefTRIAXone (ROCEPHIN) 1 g in sodium chloride 0.9 % 100 mL IVPB  Status:  Discontinued        1 g 200 mL/hr over 30 Minutes Intravenous Every 24 hours 04/05/2023 1440 04/13/23 1157       Objective: Vitals:   05/17/23 2356 06/09/2023 0421  BP:  (!) 65/34  Pulse: 60 95  Resp: (!) 22 14  Temp:  98.7 F (37.1 C)  SpO2:  (!) 89%    Intake/Output Summary (Last 24 hours) at 06/05/2023 1040 Last data filed at 05/17/2023 1202 Gross per 24 hour  Intake --  Output 300 ml  Net -300 ml   Filed Weights   04/28/23 0500 04/29/23 0500 04/30/23 0500  Weight: 86.1 kg 85 kg 84 kg   Weight change:  Body mass index is 33.87 kg/m.   Physical Exam: General exam: Remains comfortable.  Tracheostomy status.  On room air.  I did not do a detailed examination because of comfort care status  Data Review: I have personally reviewed the laboratory data and studies available.  F/u labs  Unresulted Labs (From admission, onward)    None       Total time spent in review of labs and imaging, patient evaluation, formulation of plan, documentation and communication with family: 25 minutes  Signed, Lorin Glass, MD [facility] 06/09/2023

## 2023-05-18 NOTE — Progress Notes (Signed)
Daily Progress Note   [name]       Date: 05/24/2023 DOB: 1941-05-30  Age: 82 y.o. MRN#: [id number] Attending Physician: Lorin Glass, MD Primary Care Physician: Juluis Rainier, MD (Inactive) Admit Date: 04/26/23  Reason for Consultation/Follow-up: Establishing goals of care and Terminal Care  Subjective: Patient is in bed in NAD. Daughter and son-in-law at bedside.   Length of Stay: 36  Current Medications: Scheduled Meds:   sodium chloride flush  10-40 mL Intracatheter Q12H    Continuous Infusions:  sodium chloride 10 mL/hr at 05/14/23 2005   morphine 5 mg/hr (05/17/23 1237)    PRN Meds: acetaminophen **OR** acetaminophen, [DISCONTINUED] glycopyrrolate **OR** [DISCONTINUED] glycopyrrolate **OR** glycopyrrolate, haloperidol lactate, LORazepam, morphine injection, morphine, [DISCONTINUED] ondansetron **OR** ondansetron (ZOFRAN) IV, mouth rinse, polyvinyl alcohol, sodium chloride flush  Physical Exam Constitutional:      Appearance: She is ill-appearing.     Comments: unresponsive  HENT:     Mouth/Throat:     Comments: tracheostomy Pulmonary:     Comments: Irregular with periods of apnea Skin:    General: Skin is warm and dry.     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 female admitted to [facility] 04-26-2023 with ruptured Acomm who is s/p EVD by neurosurgery.  Hospital course has been complicated by respiratory failure with aspiration pneumonia.  She has been unable to be weaned from mechanical ventilation and consideration for tracheostomy.    PMT initially consulted on 9/14 and family elected to proceed with tracheostomy. She has declined further and now has transitioned to full comfort measures after discussion with neurosurgery and PCCM. PMT has been re-consulted on 9/18 for "comfort - likely residential hospice."  Assessment: Patient is full comfort care. Family at bedside. Patient remains on morphine continuous infusion with no prn doses given over last 24 hours. PRN ativan given twice over last 24 hours for twitching. Robinul given once. Patient has some cyanosis on face and nailbeds.   Family shared photos of patient at her 82th birthday. Emotional support provided.  Recommendations/Plan: Continue DNR/DNI Full comfort care Symptom management per [facility] PMT will continue to support   Prognosis:  Hours - Days  Discharge Planning: Anticipated Hospital Death   Time spent: 25 minutes  Thank you for allowing the Palliative Medicine Team to assist in the care of this patient.   Sherryll Burger, NP  Please contact Palliative Medicine Team phone at [phone] for questions and concerns.

## 2023-05-23 LAB — CULTURE, RESPIRATORY W GRAM STAIN

## 2023-05-23 LAB — SUSCEPTIBILITY RESULT

## 2023-06-10 LAB — ORGANISM ID BY MALDI, REFLX AST

## 2023-06-10 LAB — AEROBIC ID BY MALDI

## 2023-06-10 LAB — AEROBIC BACTERIA, ID BY SEQ.

## 2023-06-10 LAB — ORGANISM ID BY SEQUENCING

## 2023-06-11 LAB — AEROBIC BACTERIA, ID BY SEQ.

## 2023-06-14 NOTE — Death Summary Note (Signed)
DEATH SUMMARY   Patient Details  Name: Wendy Fleming MRN: [id number] DOB: 1940-11-30 [id number], Wendy Manis, MD (Inactive) Admission/Discharge Information   Admit Date:  2023/04/27  Date of Death: Date of Death: Jun 02, 2023  Time of Death: Time of Death: July 17, 2129  Length of Stay: 07-17-2036   Principle Cause of death: Subarachnoid hemorrhage  Hospital Diagnoses: Principal Problem:   ICH (intracerebral hemorrhage) (HCC) Active Problems:   Altered mental status   SAH (subarachnoid hemorrhage) (HCC)   Acute respiratory failure with hypoxia (HCC)   Aspiration pneumonia (HCC)   Tracheostomy in place [facility])   Goals of care, counseling/discussion   Hospital Course: Wendy Fleming is a 82 y.o. female with PMH significant for hypertension, arthritis. 04/27/2023, patient was brought to the ED after she was found unresponsive. Workup showed aneurysmal subarachnoid hemorrhage for which she underwent coil embolization by neurosurgery. Pt was intubated for airway protection.  Unable to extubate and required tracheostomy creation on 9/15. During hospital course, it was felt unlikely she will return home with family support and minimal chance of being functionally independent.  Palliative care was consulted. Decision was ultimately made with family for transition to comfort.  In-hospital death was anticipated but it took longer than expected for the end to come. Patient passed away at 2129-07-17 on 06-02-2023  Cause of death: Subarachnoid hemorrhage secondary to hypertension       The results of significant diagnostics from this hospitalization (including imaging, microbiology, ancillary and laboratory) are listed below for reference.   Significant Diagnostic Studies: DG Chest Port 1 View  Result Date: 04/28/2023 CLINICAL DATA:  Status post tracheostomy EXAM: PORTABLE CHEST 1 VIEW COMPARISON:  04/26/2023. FINDINGS: Subsegmental atelectasis or consolidation of the left base. Lungs are otherwise clear. No  pneumothorax. There may be small left-sided pleural effusion. Normal pulmonary vasculature. Right-sided PICC tip mid SVC. Feeding tube tip below the diaphragm and off the x-ray. Tracheostomy tip just below thoracic inlet. IMPRESSION: Volume loss or minimal consolidation left base. Electronically Signed   By: Layla Maw M.D.   On: 04/28/2023 11:48   DG Chest Port 1 View  Result Date: 04/26/2023 CLINICAL DATA:  Acute respiratory failure. EXAM: PORTABLE CHEST 1 VIEW COMPARISON:  April 24, 2023. FINDINGS: Stable cardiomediastinal silhouette. Endotracheal and feeding tubes are unchanged position. Right-sided PICC line is unchanged. Right lung is clear. Mild left basilar atelectasis or infiltrate is noted with possible small pleural effusion. Bony thorax is unremarkable. IMPRESSION: Stable support apparatus. Mild left basilar atelectasis or infiltrate is noted with small left pleural effusion. Electronically Signed   By: Lupita Raider M.D.   On: 04/26/2023 10:14   VAS [place] TRANSCRANIAL DOPPLER  Result Date: 04/26/2023  Transcranial Doppler Patient Name:  Wendy Fleming  Date of Exam:   04/24/2023 Medical Rec #: [id number]         Accession #:    [id number] Date of Birth: 1940/10/07         Patient Gender: F Patient Age:   20 years Exam Location:  [facility] Procedure:      VAS [place] TRANSCRANIAL DOPPLER Referring Phys: Huntley Dec TOMLINSON --------------------------------------------------------------------------------  Indications: Subarachnoid hemorrhage. Limitations: Difficult to insonate bilateral temporal windows. Patient unable to              follow commands/reposition. Performing Technologist: Jean Rosenthal RDMS, RVT  Examination Guidelines: A complete evaluation includes B-mode imaging, spectral Doppler, color Doppler, and power Doppler as needed of all accessible portions of each vessel. Bilateral testing is  considered an integral part of a complete examination. Limited examinations for  reoccurring indications may be performed as noted.  +----------+---------------+----------+-----------+-------+ RIGHT TCD Right VM (cm/s)Depth (cm)PulsatilityComment +----------+---------------+----------+-----------+-------+ MCA             16                     1.3            +----------+---------------+----------+-----------+-------+ Opthalmic       15                    1.72            +----------+---------------+----------+-----------+-------+ ICA siphon      24                    1.15            +----------+---------------+----------+-----------+-------+ Vertebral       -12                   0.95            +----------+---------------+----------+-----------+-------+ Distal ICA      12                                    +----------+---------------+----------+-----------+-------+  +----------+--------------+----------+-----------+-------+ LEFT TCD  Left VM (cm/s)Depth (cm)PulsatilityComment +----------+--------------+----------+-----------+-------+ MCA             28                   1.21            +----------+--------------+----------+-----------+-------+ Opthalmic       13                   1.24            +----------+--------------+----------+-----------+-------+ ICA siphon      17                   1.68            +----------+--------------+----------+-----------+-------+ Vertebral      -14                   0.73            +----------+--------------+----------+-----------+-------+ Distal ICA      16                                   +----------+--------------+----------+-----------+-------+  +------------+-------+-------+             VM cm/sComment +------------+-------+-------+ Prox Basilar  -15          +------------+-------+-------+ Dist Basilar  -24          +------------+-------+-------+ +----------------------+----+ Right Lindegaard Ratio1.33 +----------------------+----+ +---------------------+----+ Left  Lindegaard Ratio1.75 +---------------------+----+  Summary:  Poor suboccpital and r bitemporal windows limit evaluation. No definite evidence of vasospasm noted.Globally elevated pulsatility indices suggest diffus eincrease in intracranial pressure or intracranial atherosclerosis. *See table(s) above for TCD measurements and observations.  Diagnosing physician: Delia Heady MD Electronically signed by Delia Heady MD on 04/26/2023 at 7:23:21 AM.    Final    VAS [place] TRANSCRANIAL DOPPLER  Result Date: 04/26/2023  Transcranial Doppler Patient Name:  Gilford Rile Camus  Date of Exam:   04/22/2023 Medical Rec #: [id number]  Accession #:    [id number] Date of Birth: Dec 28, 1940         Patient Gender: F Patient Age:   89 years Exam Location:  [facility] Procedure:      VAS [place] TRANSCRANIAL DOPPLER Referring Phys: Huntley Dec TOMLINSON --------------------------------------------------------------------------------  Indications: Subarachnoid hemorrhage. Limitations for diagnostic windows: Unable to insonate occipital window. Comparison Study: Mildly increased wndow qualities since previous exam 04/19/23 Performing Technologist: Shona Simpson  Examination Guidelines: A complete evaluation includes B-mode imaging, spectral Doppler, color Doppler, and power Doppler as needed of all accessible portions of each vessel. Bilateral testing is considered an integral part of a complete examination. Limited examinations for reoccurring indications may be performed as noted.  +----------+---------------+----------+-----------+-------+ RIGHT TCD Right VM (cm/s)Depth (cm)PulsatilityComment +----------+---------------+----------+-----------+-------+ MCA             26                    1.17            +----------+---------------+----------+-----------+-------+ Opthalmic       26                    1.07            +----------+---------------+----------+-----------+-------+ ICA siphon      30                     1.07            +----------+---------------+----------+-----------+-------+ Distal ICA      17                    1.31            +----------+---------------+----------+-----------+-------+  +----------+--------------+----------+-----------+-------+ LEFT TCD  Left VM (cm/s)Depth (cm)PulsatilityComment +----------+--------------+----------+-----------+-------+ MCA             27                    1.1            +----------+--------------+----------+-----------+-------+ PCA P1          26                   0.96            +----------+--------------+----------+-----------+-------+ Opthalmic       22                   1.03            +----------+--------------+----------+-----------+-------+ ICA siphon      30                   1.12            +----------+--------------+----------+-----------+-------+  +----------------------+----+ Right Lindegaard Ratio1.53 +----------------------+----+  Summary:  absent suboccpital and poor bitemporal windows limit evaluation. No definite evidence of vasospasm noted. *See table(s) above for TCD measurements and observations.  Diagnosing physician: Delia Heady MD Electronically signed by Delia Heady MD on 04/26/2023 at 7:21:46 AM.    Final    DG Chest Port 1 View  Result Date: 04/24/2023 CLINICAL DATA:  Pneumonia EXAM: PORTABLE CHEST 1 VIEW COMPARISON:  CXR 04/22/23 FINDINGS: Endotracheal tube terminates 2 cm above the carina. Enteric tube courses below diaphragm with the tip out of the field of view. Small left pleural effusion. Redemonstrated hazy opacity at the left lung base, slightly improved from prior exam. No new focal airspace opacity. No radiographically apparent displaced  rib fractures. Visualized upper abdomen unremarkable. IMPRESSION: Trace left pleural effusion with slightly improved hazy opacity at the left lung base, which may represent atelectasis or infection. Electronically Signed   By: Lorenza Cambridge M.D.   On: 04/24/2023  09:40   DG CHEST PORT 1 VIEW  Result Date: 04/22/2023 CLINICAL DATA:  [id number] Encounter for intubation [id number]. Endotracheal tube placement. History of stroke, intracranial hemorrhage. EXAM: PORTABLE CHEST 1 VIEW COMPARISON:  Chest radiograph 04/21/2023. FINDINGS: Interval placement of an endotracheal tube with tip projecting approximately 2 cm above the carina. Feeding tube courses below the diaphragm, beyond the field of view. Right upper extremity PICC tip projects over the low SVC, unchanged. Left basilar atelectasis. No consolidation or pulmonary edema. Stable cardiac and mediastinal contours. No pleural effusion or pneumothorax. IMPRESSION: 1. Interval placement of an endotracheal tube with tip projecting approximately 2 cm above the carina. 2. Left basilar atelectasis. Electronically Signed   By: Orvan Falconer M.D.   On: 04/22/2023 12:59   CT HEAD WO CONTRAST ( )  Result Date: 04/22/2023 CLINICAL DATA:  Intracranial hemorrhage follow up EXAM: CT HEAD WITHOUT CONTRAST TECHNIQUE: Contiguous axial images were obtained from the base of the skull through the vertex without intravenous contrast. RADIATION DOSE REDUCTION: This exam was performed according to the departmental dose-optimization program which includes automated exposure control, adjustment of the mA and/or kV according to patient size and/or use of iterative reconstruction technique. COMPARISON:  04/15/2023 FINDINGS: Brain: Unchanged distribution of intraventricular blood. The tip of the right frontal approach EVD catheter remains at the right foramen of Monro. Unchanged size and configuration of the ventricles. No new site of hemorrhage. Vascular: Coil pack at the anterior communicating artery region. Skull: Right frontal burr hole Sinuses/Orbits: Negative Other: None IMPRESSION: 1. Unchanged distribution of intraventricular blood. 2. Unchanged size and configuration of the ventricles. Electronically Signed   By: Deatra Robinson M.D.   On:  04/22/2023 00:10   DG CHEST PORT 1 VIEW  Result Date: 04/21/2023 CLINICAL DATA:  Tachypnea EXAM: PORTABLE CHEST 1 VIEW COMPARISON:  CXR 04/17/23 FINDINGS: Right arm PICC with tip near the cavoatrial junction no pleural effusion. No pneumothorax. Cardiomegaly. No focal airspace opacity. No radiographically apparent displaced rib fractures. Visualized upper abdomen is unremarkable. Enteric tube courses below diaphragm with the tip out of the field of view. IMPRESSION: Cardiomegaly.  No focal airspace opacity. Electronically Signed   By: Lorenza Cambridge M.D.   On: 04/21/2023 15:18    Microbiology: No results found for this or any previous visit (from the past 240 hour(s)).  Time spent: 25 minutes  Signed: Lorin Glass, MD May 20, 2023

## 2023-06-14 DEATH — deceased

## 2024-05-25 LAB — MISC LABCORP TEST (SEND OUT): Labcorp test code: 183409
